# Patient Record
Sex: Male | Born: 1945 | Race: White | Hispanic: No | Marital: Married | Smoking: Never smoker
Health system: Southern US, Community
[De-identification: ages and names within clinical notes are randomized; demographics above are authoritative.]

## PROBLEM LIST (undated history)

## (undated) DIAGNOSIS — I428 Other cardiomyopathies: Secondary | ICD-10-CM

## (undated) DIAGNOSIS — I493 Ventricular premature depolarization: Secondary | ICD-10-CM

## (undated) DIAGNOSIS — E785 Hyperlipidemia, unspecified: Secondary | ICD-10-CM

## (undated) DIAGNOSIS — I712 Thoracic aortic aneurysm, without rupture: Secondary | ICD-10-CM

## (undated) DIAGNOSIS — N400 Enlarged prostate without lower urinary tract symptoms: Secondary | ICD-10-CM

## (undated) DIAGNOSIS — I5022 Chronic systolic (congestive) heart failure: Secondary | ICD-10-CM

## (undated) DIAGNOSIS — Z9289 Personal history of other medical treatment: Secondary | ICD-10-CM

## (undated) DIAGNOSIS — R001 Bradycardia, unspecified: Secondary | ICD-10-CM

## (undated) DIAGNOSIS — N210 Calculus in bladder: Secondary | ICD-10-CM

## (undated) DIAGNOSIS — I10 Essential (primary) hypertension: Secondary | ICD-10-CM

## (undated) DIAGNOSIS — G4730 Sleep apnea, unspecified: Secondary | ICD-10-CM

## (undated) HISTORY — DX: Thoracic aortic aneurysm, without rupture: I71.2

## (undated) HISTORY — DX: Chronic systolic (congestive) heart failure: I50.22

## (undated) HISTORY — DX: Sleep apnea, unspecified: G47.30

## (undated) HISTORY — DX: Personal history of other medical treatment: Z92.89

## (undated) HISTORY — DX: Ventricular premature depolarization: I49.3

## (undated) HISTORY — DX: Other cardiomyopathies: I42.8

## (undated) HISTORY — DX: Essential (primary) hypertension: I10

## (undated) HISTORY — DX: Bradycardia, unspecified: R00.1

## (undated) HISTORY — PX: TONSILLECTOMY: SUR1361

## (undated) HISTORY — DX: Hyperlipidemia, unspecified: E78.5

---

## 2002-08-25 ENCOUNTER — Ambulatory Visit (HOSPITAL_BASED_OUTPATIENT_CLINIC_OR_DEPARTMENT_OTHER): Admission: RE | Admit: 2002-08-25 | Discharge: 2002-08-25 | Payer: Self-pay | Admitting: Family Medicine

## 2003-01-11 ENCOUNTER — Inpatient Hospital Stay (HOSPITAL_COMMUNITY): Admission: EM | Admit: 2003-01-11 | Discharge: 2003-01-12 | Payer: Self-pay | Admitting: Emergency Medicine

## 2003-04-09 ENCOUNTER — Encounter: Admission: RE | Admit: 2003-04-09 | Discharge: 2003-04-09 | Payer: Self-pay | Admitting: Family Medicine

## 2004-09-07 ENCOUNTER — Ambulatory Visit: Payer: Self-pay | Admitting: Internal Medicine

## 2004-10-10 ENCOUNTER — Ambulatory Visit: Payer: Self-pay

## 2004-10-20 ENCOUNTER — Ambulatory Visit: Payer: Self-pay | Admitting: Internal Medicine

## 2004-11-21 ENCOUNTER — Ambulatory Visit: Payer: Self-pay | Admitting: Pulmonary Disease

## 2005-08-01 ENCOUNTER — Ambulatory Visit: Payer: Self-pay | Admitting: Pulmonary Disease

## 2006-01-21 ENCOUNTER — Ambulatory Visit (HOSPITAL_BASED_OUTPATIENT_CLINIC_OR_DEPARTMENT_OTHER): Admission: RE | Admit: 2006-01-21 | Discharge: 2006-01-21 | Payer: Self-pay | Admitting: Pulmonary Disease

## 2006-02-05 ENCOUNTER — Ambulatory Visit: Payer: Self-pay | Admitting: Pulmonary Disease

## 2006-04-03 ENCOUNTER — Ambulatory Visit: Payer: Self-pay | Admitting: Pulmonary Disease

## 2006-05-01 ENCOUNTER — Ambulatory Visit: Payer: Self-pay | Admitting: Internal Medicine

## 2006-05-30 ENCOUNTER — Ambulatory Visit: Payer: Self-pay | Admitting: Internal Medicine

## 2006-05-30 LAB — CONVERTED CEMR LAB
Direct LDL: 98.6 mg/dL
HDL: 24.8 mg/dL — ABNORMAL LOW (ref >39.0)
Triglycerides: 213 mg/dL (ref 0–149)
VLDL: 43 mg/dL — ABNORMAL HIGH (ref 0–40)

## 2006-12-13 ENCOUNTER — Encounter: Payer: Self-pay | Admitting: Internal Medicine

## 2007-01-02 HISTORY — PX: EP IMPLANTABLE DEVICE: SHX172B

## 2007-01-08 ENCOUNTER — Ambulatory Visit: Payer: Self-pay | Admitting: Cardiovascular Disease

## 2007-02-28 ENCOUNTER — Encounter: Payer: Self-pay | Admitting: Cardiology

## 2007-02-28 ENCOUNTER — Ambulatory Visit: Payer: Self-pay

## 2007-02-28 LAB — CONVERTED CEMR LAB
BUN: 11 mg/dL (ref 6–23)
Basophils Absolute: 0 10*3/uL (ref 0.0–0.1)
Calcium: 9.9 mg/dL (ref 8.4–10.5)
Eosinophils Absolute: 0.2 10*3/uL (ref 0.0–0.6)
GFR calc Af Amer: 88 mL/min
GFR calc non Af Amer: 73 mL/min
Lymphocytes Relative: 28 % (ref 12.0–46.0)
MCV: 91 fL (ref 78.0–100.0)
Monocytes Relative: 7.3 % (ref 3.0–11.0)
Neutro Abs: 3.1 10*3/uL (ref 1.4–7.7)
Platelets: 98 10*3/uL — ABNORMAL LOW (ref 150–400)
aPTT: 25 s (ref 21.7–29.8)

## 2007-03-04 ENCOUNTER — Ambulatory Visit: Payer: Self-pay | Admitting: Cardiology

## 2007-03-04 ENCOUNTER — Inpatient Hospital Stay (HOSPITAL_BASED_OUTPATIENT_CLINIC_OR_DEPARTMENT_OTHER): Admission: RE | Admit: 2007-03-04 | Discharge: 2007-03-04 | Payer: Self-pay | Admitting: Cardiology

## 2007-03-13 ENCOUNTER — Encounter: Payer: Self-pay | Admitting: Internal Medicine

## 2007-03-13 ENCOUNTER — Ambulatory Visit: Payer: Self-pay | Admitting: Cardiovascular Disease

## 2007-03-13 ENCOUNTER — Observation Stay (HOSPITAL_COMMUNITY): Admission: EM | Admit: 2007-03-13 | Discharge: 2007-03-14 | Payer: Self-pay | Admitting: Emergency Medicine

## 2007-03-13 ENCOUNTER — Ambulatory Visit: Payer: Self-pay

## 2007-03-17 ENCOUNTER — Ambulatory Visit: Payer: Self-pay | Admitting: Internal Medicine

## 2007-03-17 ENCOUNTER — Ambulatory Visit (HOSPITAL_COMMUNITY): Admission: RE | Admit: 2007-03-17 | Discharge: 2007-03-18 | Payer: Self-pay | Admitting: Internal Medicine

## 2007-03-24 DIAGNOSIS — G4730 Sleep apnea, unspecified: Secondary | ICD-10-CM | POA: Insufficient documentation

## 2007-03-25 ENCOUNTER — Ambulatory Visit: Payer: Self-pay | Admitting: Pulmonary Disease

## 2007-03-25 DIAGNOSIS — J984 Other disorders of lung: Secondary | ICD-10-CM | POA: Insufficient documentation

## 2007-03-31 ENCOUNTER — Ambulatory Visit: Payer: Self-pay

## 2007-04-08 ENCOUNTER — Telehealth: Payer: Self-pay | Admitting: Pulmonary Disease

## 2007-04-22 ENCOUNTER — Ambulatory Visit: Payer: Self-pay | Admitting: Pulmonary Disease

## 2007-04-25 ENCOUNTER — Ambulatory Visit: Payer: Self-pay

## 2007-04-25 ENCOUNTER — Ambulatory Visit: Payer: Self-pay | Admitting: Internal Medicine

## 2007-04-30 ENCOUNTER — Ambulatory Visit: Payer: Self-pay | Admitting: Gastroenterology

## 2007-04-30 ENCOUNTER — Encounter: Payer: Self-pay | Admitting: Gastroenterology

## 2007-05-27 ENCOUNTER — Telehealth: Payer: Self-pay | Admitting: Gastroenterology

## 2007-05-28 ENCOUNTER — Ambulatory Visit: Payer: Self-pay | Admitting: Gastroenterology

## 2007-05-28 ENCOUNTER — Encounter: Payer: Self-pay | Admitting: Gastroenterology

## 2007-05-29 ENCOUNTER — Encounter: Payer: Self-pay | Admitting: Gastroenterology

## 2007-07-03 ENCOUNTER — Ambulatory Visit: Payer: Self-pay | Admitting: Internal Medicine

## 2007-07-03 LAB — CONVERTED CEMR LAB
BUN: 11 mg/dL (ref 6–23)
GFR calc Af Amer: 88 mL/min
GFR calc non Af Amer: 73 mL/min
Potassium: 3.9 meq/L (ref 3.5–5.1)
Sodium: 141 meq/L (ref 135–145)

## 2007-08-04 ENCOUNTER — Ambulatory Visit: Payer: Self-pay | Admitting: Internal Medicine

## 2007-09-18 ENCOUNTER — Ambulatory Visit: Payer: Self-pay | Admitting: Internal Medicine

## 2007-10-22 ENCOUNTER — Ambulatory Visit: Payer: Self-pay | Admitting: Internal Medicine

## 2008-01-21 ENCOUNTER — Ambulatory Visit: Payer: Self-pay | Admitting: Internal Medicine

## 2008-03-02 ENCOUNTER — Encounter: Payer: Self-pay | Admitting: Pulmonary Disease

## 2008-03-05 ENCOUNTER — Ambulatory Visit (HOSPITAL_COMMUNITY): Admission: RE | Admit: 2008-03-05 | Discharge: 2008-03-05 | Payer: Self-pay | Admitting: Pulmonary Disease

## 2008-03-08 ENCOUNTER — Telehealth (INDEPENDENT_AMBULATORY_CARE_PROVIDER_SITE_OTHER): Payer: Self-pay | Admitting: *Deleted

## 2008-03-24 DIAGNOSIS — I495 Sick sinus syndrome: Secondary | ICD-10-CM | POA: Insufficient documentation

## 2008-03-24 DIAGNOSIS — I493 Ventricular premature depolarization: Secondary | ICD-10-CM | POA: Insufficient documentation

## 2008-03-24 DIAGNOSIS — I359 Nonrheumatic aortic valve disorder, unspecified: Secondary | ICD-10-CM | POA: Insufficient documentation

## 2008-03-25 ENCOUNTER — Ambulatory Visit: Payer: Self-pay | Admitting: Internal Medicine

## 2008-03-31 ENCOUNTER — Telehealth (INDEPENDENT_AMBULATORY_CARE_PROVIDER_SITE_OTHER): Payer: Self-pay

## 2008-04-01 ENCOUNTER — Ambulatory Visit: Payer: Self-pay

## 2008-04-01 ENCOUNTER — Encounter: Payer: Self-pay | Admitting: Internal Medicine

## 2008-04-09 ENCOUNTER — Encounter: Payer: Self-pay | Admitting: Internal Medicine

## 2008-04-22 ENCOUNTER — Ambulatory Visit: Payer: Self-pay | Admitting: Internal Medicine

## 2008-04-22 ENCOUNTER — Encounter: Payer: Self-pay | Admitting: Internal Medicine

## 2008-04-22 DIAGNOSIS — E785 Hyperlipidemia, unspecified: Secondary | ICD-10-CM | POA: Insufficient documentation

## 2008-04-26 ENCOUNTER — Ambulatory Visit: Payer: Self-pay | Admitting: Internal Medicine

## 2008-04-29 ENCOUNTER — Encounter (INDEPENDENT_AMBULATORY_CARE_PROVIDER_SITE_OTHER): Payer: Self-pay | Admitting: *Deleted

## 2008-05-26 ENCOUNTER — Telehealth: Payer: Self-pay | Admitting: Internal Medicine

## 2008-06-30 ENCOUNTER — Telehealth: Payer: Self-pay | Admitting: Internal Medicine

## 2008-07-02 ENCOUNTER — Encounter: Payer: Self-pay | Admitting: Internal Medicine

## 2008-07-02 ENCOUNTER — Ambulatory Visit: Payer: Self-pay

## 2008-07-06 DIAGNOSIS — I712 Thoracic aortic aneurysm, without rupture, unspecified: Secondary | ICD-10-CM | POA: Insufficient documentation

## 2008-07-06 HISTORY — DX: Thoracic aortic aneurysm, without rupture: I71.2

## 2008-07-06 HISTORY — DX: Thoracic aortic aneurysm, without rupture, unspecified: I71.20

## 2008-08-31 ENCOUNTER — Ambulatory Visit: Payer: Self-pay | Admitting: Internal Medicine

## 2008-08-31 DIAGNOSIS — R072 Precordial pain: Secondary | ICD-10-CM | POA: Insufficient documentation

## 2008-08-31 DIAGNOSIS — Z950 Presence of cardiac pacemaker: Secondary | ICD-10-CM | POA: Insufficient documentation

## 2008-09-09 ENCOUNTER — Ambulatory Visit: Payer: Self-pay | Admitting: Internal Medicine

## 2008-09-13 ENCOUNTER — Ambulatory Visit: Payer: Self-pay | Admitting: Internal Medicine

## 2008-09-23 LAB — CONVERTED CEMR LAB
Cholesterol: 146 mg/dL (ref 0–200)
Total CHOL/HDL Ratio: 6
Triglycerides: 116 mg/dL (ref 0.0–149.0)

## 2008-11-15 ENCOUNTER — Encounter: Payer: Self-pay | Admitting: Internal Medicine

## 2008-11-15 DIAGNOSIS — R079 Chest pain, unspecified: Secondary | ICD-10-CM | POA: Insufficient documentation

## 2008-12-01 ENCOUNTER — Telehealth (INDEPENDENT_AMBULATORY_CARE_PROVIDER_SITE_OTHER): Payer: Self-pay

## 2008-12-02 ENCOUNTER — Encounter: Payer: Self-pay | Admitting: Internal Medicine

## 2008-12-02 ENCOUNTER — Encounter: Payer: Self-pay | Admitting: Cardiology

## 2008-12-02 ENCOUNTER — Ambulatory Visit: Payer: Self-pay | Admitting: Cardiology

## 2008-12-02 ENCOUNTER — Encounter (HOSPITAL_COMMUNITY): Admission: RE | Admit: 2008-12-02 | Discharge: 2008-12-29 | Payer: Self-pay | Admitting: Internal Medicine

## 2008-12-02 ENCOUNTER — Ambulatory Visit: Payer: Self-pay

## 2008-12-09 ENCOUNTER — Encounter: Payer: Self-pay | Admitting: Internal Medicine

## 2008-12-27 ENCOUNTER — Ambulatory Visit: Payer: Self-pay | Admitting: Internal Medicine

## 2009-01-06 ENCOUNTER — Encounter: Payer: Self-pay | Admitting: Internal Medicine

## 2009-02-28 ENCOUNTER — Ambulatory Visit: Payer: Self-pay | Admitting: Internal Medicine

## 2009-03-01 LAB — CONVERTED CEMR LAB
CO2: 30 meq/L (ref 19–32)
Chloride: 109 meq/L (ref 96–112)
Creatinine, Ser: 1.1 mg/dL (ref 0.4–1.5)

## 2009-03-02 ENCOUNTER — Ambulatory Visit: Payer: Self-pay | Admitting: Cardiovascular Disease

## 2009-03-02 ENCOUNTER — Ambulatory Visit: Payer: Self-pay | Admitting: Internal Medicine

## 2009-03-03 ENCOUNTER — Ambulatory Visit: Payer: Self-pay | Admitting: Internal Medicine

## 2009-03-03 DIAGNOSIS — I2510 Atherosclerotic heart disease of native coronary artery without angina pectoris: Secondary | ICD-10-CM | POA: Insufficient documentation

## 2009-03-15 ENCOUNTER — Encounter: Payer: Self-pay | Admitting: Internal Medicine

## 2009-04-21 ENCOUNTER — Telehealth: Payer: Self-pay | Admitting: Internal Medicine

## 2009-05-24 ENCOUNTER — Encounter: Admission: RE | Admit: 2009-05-24 | Discharge: 2009-06-08 | Payer: Self-pay | Admitting: Orthopedic Surgery

## 2009-06-28 ENCOUNTER — Ambulatory Visit: Payer: Self-pay | Admitting: Internal Medicine

## 2009-09-29 ENCOUNTER — Ambulatory Visit: Payer: Self-pay | Admitting: Internal Medicine

## 2009-09-30 ENCOUNTER — Encounter: Payer: Self-pay | Admitting: Internal Medicine

## 2009-11-16 ENCOUNTER — Encounter: Payer: Self-pay | Admitting: Internal Medicine

## 2010-01-05 ENCOUNTER — Ambulatory Visit
Admission: RE | Admit: 2010-01-05 | Discharge: 2010-01-05 | Payer: Self-pay | Source: Home / Self Care | Attending: Internal Medicine | Admitting: Internal Medicine

## 2010-01-05 ENCOUNTER — Encounter: Payer: Self-pay | Admitting: Internal Medicine

## 2010-01-22 ENCOUNTER — Encounter: Payer: Self-pay | Admitting: Pulmonary Disease

## 2010-01-31 NOTE — Letter (Signed)
Summary: Remote Device Check  [facility], Main Office  [address]   [place], [place] [address]   Phone: [phone]  Fax: [phone]     January 06, 2009 MRN: [id number]   Patrick Weaver [address] [place], [place]  [address]   Dear Mr. FORGET,   Your remote transmission was recieved and reviewed by your physician.  All diagnostics were within normal limits for you.  __X___Your next transmission is scheduled for:   March 30, 2009.  Please transmit at any time this day.  If you have a wireless device your transmission will be sent automatically.      Sincerely,  Proofreader

## 2010-01-31 NOTE — Cardiovascular Report (Signed)
Summary: Office Visit Remote   Office Visit Remote   Imported By: Roderic Ovens 01/12/2009 14:59:42  _____________________________________________________________________  External Attachment:    Type:   Image     Comment:   External Document

## 2010-01-31 NOTE — Cardiovascular Report (Signed)
Summary: Office Visit   Office Visit   Imported By: Roderic Ovens 06/29/2009 12:29:17  _____________________________________________________________________  External Attachment:    Type:   Image     Comment:   External Document

## 2010-01-31 NOTE — Assessment & Plan Note (Signed)
Summary: PER CHECK OUT/SF ct of chest @ 8:30   Visit Type:  Follow-up Primary Provider:  Lanell Persons   History of Present Illness: Patient is a 65 year old with a history of mildly depressed LVEF , PVCs, bradycardia.  I last saw him in the fall.   When I saw him last, he had complained of some chest pressure.  Myoview was not completely normal, but unchanged from previous (he has very mild CAD).  His pressure had resolved. SInce then he denies significant pressure.  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  He denies dizziness.  Notes some palpitations recently.  Current Medications (verified): 1)  Carvedilol 6.25 Mg Tabs (Carvedilol) .... Take 1 Tablet By Mouth Two Times A Day 2)  Avapro 150 Mg Tabs (Irbesartan) .... Take 1 Tablet By Mouth Once A Day 3)  Flecainide Acetate 150 Mg Tabs (Flecainide Acetate) .... 1/2 Tablet By Mouth Two Times A Day  Allergies (verified): 1)  ! Codeine  Past History:  Past Medical History: Last updated: 03/24/2008 Sleep Apnea palpitations- dyslipidemia-failed niaspan SLEEP APNEA (ICD-780.57)  bradycardia, treated with pacemaker placement Dr. Berton Mount 2009. Hypertension  Social History: Last updated: 03/03/2009 married and lives in [place] Has  his own Teaching laboratory technician shop. Drinks 3 caffeinated beverages a day; denies alcohol nonsmoker. Has 3 daughters.  Social History: married and lives in [place] Has  his own Teaching laboratory technician shop. Drinks 3 caffeinated beverages a day; denies alcohol nonsmoker. Has 3 daughters.  Vital Signs:  Patient profile:   65 year old male Height:      72 inches Weight:      208 pounds BMI:     28.31 Pulse rate:   60 / minute BP sitting:   120 / 75  (left arm) Cuff size:   large  Vitals Entered By: Burnett Kanaris, CNA (March 03, 2009 9:12 AM)  Physical Exam  Additional Exam:  HEENT:  Normocephalic, atraumatic. EOMI, PERRLA.  Neck: JVP is normal. No thyromegaly. No bruits.    Lungs: clear to auscultation. No rales no wheezes.  Heart: Regular rate and rhythm. Normal S1, S2. No S3.  Gr 1/VI diastolic murmur at L sternal border.  PMI not displaced.  Abdomen:  Supple, nontender. Normal bowel sounds. No masses. No hepatomegaly.  Extremities:   Good distal pulses throughout. No lower extremity edema.  Musculoskeletal :moving all extremities.  Neuro:   alert and oriented x3.    PPM Specifications Following MD:  Sherryl Manges, MD     Referring MD:  Laasia Arcos PPM Vendor:  Medtronic     PPM Model Number:  [id number]     PPM Serial Number:  [id number] PPM DOI:  03/17/2007     PPM Implanting MD:  Sherryl Manges, MD  Lead 1    Location: RA     DOI: 03/17/2007     Model #: [id number]     Serial #: [id number]     Status: active Lead 2    Location: RV     DOI: 03/17/2007     Model #: [id number]     Serial #: [id number]     Status: active   Indications:  SSS, BRADYCARDIA   PPM Follow Up Pacer Dependent:  No      Episodes Coumadin:  No  Parameters Mode:  DDDR+     Lower Rate Limit:  60     Upper Rate Limit:  130 Paced AV Delay:  150     Sensed AV Delay:  120  Impression & Recommendations:  Problem # 1:  PALPITATIONS (ICD-785.1) Phone check of his pacer shows that he had a total of 403,000 PVCs during 5 mon, not alot.  He had 4 episodes of mode switching not long ago that were less than 30 sec. I would keep him on his same medicines.  I would try tapering his Coreg to see if, by allowing his heart rate toincrase he will have some more energy.  He needs to be on a b blocker since he is on flecanide.  He will calll in 2 to 3 wks with response.  Problem # 2:  CHEST PAIN UNSPECIFIED (ICD-786.50) Patient denies. He does have mild CAD.  Myoview had not changed from past.  I am not convinced that tiring represents an angina equivalent.  Problem # 3:  CONGESTIVE HEART FAILURE, UNSPECIFIED (ICD-428.0) Patient has very mild LV dysfunction by echo.  I am not convinced this explains his symtoms.  I  would keep him on the same meds. Note his father has a history of a cardiomyopathy.  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  Willl need to determine f/u.  Problem # 5:  AORTIC INSUFFICIENCY (ICD-424.1) BY exam mild.  WIll need f/u .

## 2010-01-31 NOTE — Letter (Signed)
Summary: Remote Device Check  [facility], Main Office  [address]   [place], [place] [address]   Phone: [phone]  Fax: [phone]     March 15, 2009 MRN: [id number]   Patrick Weaver [address] [place], [place]  [address]   Dear Patrick Weaver,   Your remote transmission was recieved and reviewed by your physician.  All diagnostics were within normal limits for you.    __X____Your next office visit is scheduled for:   JUNE 2011 WITH DR Graciela Husbands. Please call our office to schedule an appointment.    Sincerely,  Proofreader

## 2010-01-31 NOTE — Letter (Signed)
Summary: Remote Device Check  [facility], Main Office  [address]   [place], [place] [address]   Phone: [phone]  Fax: [phone]     November 16, 2009 MRN: [id number]   Patrick Weaver [address] [place], [place]  [address]   Dear [name],   Your remote transmission was recieved and reviewed by your physician.  All diagnostics were within normal limits for you.  __X___Your next transmission is scheduled for:  01-05-2010.  Please transmit at any time this day.  If you have a wireless device your transmission will be sent automatically.   Sincerely,  Vella Kohler

## 2010-01-31 NOTE — Assessment & Plan Note (Signed)
Summary: pc2   Primary Provider:  Lanell Persons   History of Present Illness:  Patrick Weaver is seen in followup for pacer implantation for bradycardia.  He has also had significant problems with pvcs. This has been treated with flecanide with much improvement.  The patient denies SOB, chest pain, edema or palpitations   Myoview scan last December demonstrated ejection fraction of 44% with global hypokinesis.  Current Medications (verified): 1)  Carvedilol 6.25 Mg Tabs (Carvedilol) .... Take 1 Tablet By Mouth Two Times A Day 2)  Flecainide Acetate 150 Mg Tabs (Flecainide Acetate) .... 1/2 Tablet By Mouth Two Times A Day 3)  Cozaar 50 Mg Tabs (Losartan Potassium) .Marland Kitchen.. 1 Tab Every Day  ( Generic Is Ok)  Allergies (verified): 1)  ! Codeine  Past History:  Past Medical History: Last updated: 03/24/2008 Sleep Apnea palpitations- dyslipidemia-failed niaspan SLEEP APNEA (ICD-780.57)  bradycardia, treated with pacemaker placement Dr. Berton Mount 2009. Hypertension  Past Surgical History: Last updated: 09/09/2008 Permanant pacemaker  Family History: Last updated: 03/24/2008 hypertension-mother..brother Dad:  Hx CHF; died of MI No FH of Colon Cancer:  Social History: Last updated: 03/03/2009 married and lives in [place] Has  his own Teaching laboratory technician shop. Drinks 3 caffeinated beverages a day; denies alcohol nonsmoker. Has 3 daughters.  Vital Signs:  Patient profile:   65 year old male Height:      72 inches Weight:      204 pounds BMI:     27.77 Pulse rate:   71 / minute Pulse rhythm:   regular BP sitting:   126 / 80  (left arm) Cuff size:   regular  Vitals Entered By: Judithe Modest CMA (June 28, 2009 10:15 AM)  Physical Exam  General:  The patient was alert and oriented in no acute distress. HEENT Normal.  Neck veins were flat, carotids were brisk.  Lungs were clear.  Heart sounds were regular without murmurs or gallops.  Abdomen was soft with active bowel  sounds. There is no clubbing cyanosis or edema. Skin Warm and dry    PPM Specifications Following MD:  Sherryl Manges, MD     Referring MD:  ROSS PPM Vendor:  Medtronic     PPM Model Number:  ADDRL1     PPM Serial Number:  [id number] PPM DOI:  03/17/2007     PPM Implanting MD:  Sherryl Manges, MD  Lead 1    Location: RA     DOI: 03/17/2007     Model #: [id number]     Serial #: [id number]     Status: active Lead 2    Location: RV     DOI: 03/17/2007     Model #: [id number]     Serial #: [id number]     Status: active   Indications:  SSS, BRADYCARDIA   PPM Follow Up Battery Voltage:  2.79 V     Battery Est. Longevity:  12.42yrs     Pacer Dependent:  No       PPM Device Measurements Atrium  Amplitude: 2.80 mV, Impedance: 397 ohms, Threshold: 0.50 V at 0.40 msec Right Ventricle  Amplitude: 22.40 mV, Impedance: 514 ohms, Threshold: 0.50 V at 0.40 msec  Episodes MS Episodes:  6     Percent Mode Switch:  <0.1%     Coumadin:  No Ventricular High Rate:  1     Atrial Pacing:  80.4%     Ventricular Pacing:  0.9%  Parameters Mode:  MVP     Lower Rate Limit:  60     Upper Rate Limit:  160 Paced AV Delay:  150     Sensed AV Delay:  120 Next Remote Date:  09/29/2009     Tech Comments:  1 VHR EPISODE LASTING 6 SECONDS.  6 MODE SWITCHES-ALL LESS THAN 1 MINUTE.  NORMAL DEVICE FUNCTION.  CHANGED MSR AND MTR TO 160bpm DUE TO AGE AND HISTOGRAM.  CARELINK CHECK 09-29-09.  Vella Kohler  June 28, 2009 10:31 AM  Impression & Recommendations:  Problem # 1:  LEFT VENTRICULAR FUNCTION, DECREASED (ICD-429.2) the patient has a mild cardiopath; yongoing therapy with beta blockers and ACE inhibitors is appropriate.  up titration may be necessary overtime. I think the ejection fraction is probably adequate to allow continued use of flecainide. In the event that it falls below 40% however, I think would be compelled to stop  Problem # 2:  PACEMAKER, PERMANENT (ICD-V45.01) Device parameters and data were reviewed; heart rate  histograms were evaluated were found to be limited. Less 1% of his beats exceed 65% of his predicted maximal heart rate  Problem # 3:  BRADYCARDIA (ICD-427.89) Chromotropically incompetent as noted above; device adjustments made His updated medication list for this problem includes:    Carvedilol 6.25 Mg Tabs (Carvedilol) .Marland Kitchen... Take 1 tablet by mouth two times a day    Flecainide Acetate 150 Mg Tabs (Flecainide acetate) .Marland Kitchen... 1/2 tablet by mouth two times a day  Problem # 4:  PREMATURE VENTRICULAR CONTRACTIONS (ICD-427.69)  His updated medication list for this problem includes:    Carvedilol 6.25 Mg Tabs (Carvedilol) .Marland Kitchen... Take 1 tablet by mouth two times a day    Flecainide Acetate 150 Mg Tabs (Flecainide acetate) .Marland Kitchen... 1/2 tablet by mouth two times a day  His updated medication list for this problem includes:    Carvedilol 6.25 Mg Tabs (Carvedilol) .Marland Kitchen... Take 1 tablet by mouth two times a day    Flecainide Acetate 150 Mg Tabs (Flecainide acetate) .Marland Kitchen... 1/2 tablet by mouth two times a day

## 2010-01-31 NOTE — Cardiovascular Report (Signed)
Summary: Office Visit Remote   Office Visit Remote   Imported By: Roderic Ovens 11/18/2009 14:11:04  _____________________________________________________________________  External Attachment:    Type:   Image     Comment:   External Document

## 2010-01-31 NOTE — Progress Notes (Signed)
Summary: cost of avapro double in pay.   Phone Note Call from Patient Call back at Home Phone [phone]   Refills Requested: Medication #1:  G TABS Take 1 tablet by mouth once a day Caller: Spouse -donna  Reason for Call: Talk to Nurse Summary of Call: Patrick Weaver on AVAPRO 150 M. the co-pay have double. wife call insurance company - they told her dr. Tenny Craw can write a letter of necssity, fax # [phone]. or chose preferred list. 1. benicar 2. mardicas 3. losartan. Patrick Weaver has only one tab left for today. [phone] . Initial call taken by: Lorne Skeens,  April 21, 2009 8:34 AM  Follow-up for Phone Call        Called patient's wife back...advised we can change medication to Cozaar 50mg  every day per Dr.Katz (DOD) and Weston Brass Pharm D. Follow-up by: J REISS RN    New/Updated Medications: COZAAR 50 MG TABS (LOSARTAN POTASSIUM) 1 Tab every day  ( generic is OK) Prescriptions: COZAAR 50 MG TABS (LOSARTAN POTASSIUM) 1 Tab every day  ( generic is OK)  #90 x 3   Entered by:   Layne Benton, RN, BSN   Authorized by:   Sherrill Raring, MD, [facility]   Signed by:   Layne Benton, RN, BSN on 04/21/2009   Method used:   [facility]ronically to        [facility] [phone][facility] (retail)       [address]       [place], [place]  [address]       Ph: [phone]       Fax: [phone]   RxID:   [id number]

## 2010-01-31 NOTE — Cardiovascular Report (Signed)
Summary: Office Visit Remote   Office Visit Remote   Imported By: Roderic Ovens 03/16/2009 11:21:34  _____________________________________________________________________  External Attachment:    Type:   Image     Comment:   External Document

## 2010-02-12 ENCOUNTER — Encounter (INDEPENDENT_AMBULATORY_CARE_PROVIDER_SITE_OTHER): Payer: Self-pay | Admitting: *Deleted

## 2010-02-22 NOTE — Letter (Signed)
Summary: Remote Device Check  [facility], Main Office  [address]   [place], [place] [address]   Phone: [phone]  Fax: [phone]     February 12, 2010 MRN: [id number]   Patrick Weaver [address] [place], [place]  [address]   Dear [name],   Your remote transmission was recieved and reviewed by your physician.  All diagnostics were within normal limits for you.  __X___Your next transmission is scheduled for:  04-06-2010.  Please transmit at any time this day.  If you have a wireless device your transmission will be sent automatically.   Sincerely,  Vella Kohler

## 2010-02-22 NOTE — Cardiovascular Report (Signed)
Summary: Office Visit Remote   Office Visit Remote   Imported By: Roderic Ovens 02/14/2010 15:15:20  _____________________________________________________________________  External Attachment:    Type:   Image     Comment:   External Document

## 2010-04-06 ENCOUNTER — Ambulatory Visit (INDEPENDENT_AMBULATORY_CARE_PROVIDER_SITE_OTHER): Payer: BC Managed Care – PPO | Admitting: *Deleted

## 2010-04-06 DIAGNOSIS — R0989 Other specified symptoms and signs involving the circulatory and respiratory systems: Secondary | ICD-10-CM

## 2010-04-06 DIAGNOSIS — I495 Sick sinus syndrome: Secondary | ICD-10-CM

## 2010-04-06 DIAGNOSIS — Z950 Presence of cardiac pacemaker: Secondary | ICD-10-CM

## 2010-04-07 ENCOUNTER — Other Ambulatory Visit: Payer: Self-pay

## 2010-04-10 NOTE — Progress Notes (Signed)
Pacer remote check  

## 2010-04-19 ENCOUNTER — Other Ambulatory Visit: Payer: Self-pay | Admitting: Internal Medicine

## 2010-04-23 ENCOUNTER — Encounter: Payer: Self-pay | Admitting: *Deleted

## 2010-05-16 NOTE — Letter (Signed)
October 22, 2007    Pricilla Riffle, MD, [facility]  [address],  [address]  [place], [place] [address]   RE:  Patrick Weaver, Patrick Weaver  MRN:  [id number]  /  DOB:  11/19/1945   Dear Gunnar Fusi:   Thanks very much for asking [place] to see Patrick Weaver about his PVC burden.   As you know, he underwent pacemaker implantation in the Spring of this  year for sinus node dysfunction that was quite limiting.  His exercise  capacity is markedly improved since then.  The other issue is that he  continues to have palpitations.  Interrogation of device since you saw  him last, demonstrated 2300 PVCs in the last month.  These are  unassociated with exercise intolerance, lightheadedness, or discomfort.   His exercise capacity is much improved as noted.   Part of the other issue as to do with his intervening changes LV  function.  Catheterization a few years ago demonstrated nonobstructive  disease and ejection fraction of 55%, catheterization by Dr. Riley Kill in  March of this year demonstrated ejection fraction was mildly globally  hypokinetic.  The nuclear medicine stress test had demonstrated  calculated ejection fraction.  The nuclear medicine stress test at  calculating EF of 40% and the echo that was done following had an  ejection fraction of 55%.  The LV end-diastolic dimension were described  as 58 mm and then crossed out by you, I think you described them as  being normal, although the comment is mildly dilated by Brian's  review.   His medications are notable for taking nadolol and Avapro.  His cough  were improved with the discontinuation of lisinopril, and his headache  stopped with the discontinuation of atenolol.  His doses were 20 or 10  and 75 respectively.   On examination, his blood pressure was 119/72.  His pulse was 67.  His  weight was 201.  His neck veins were flat.  His lungs were clear.  His heart sounds were regular.  The abdomen was soft and the extremities  were without edema.   Review  of his Holter monitor from 2008 demonstrated 1500 PVCs a day,  this compares to 8000 on average since his last visit.  Morphologically  there are five different types with one predominant.   IMPRESSION:  1. Sinus node dysfunction, status post pacemaker implantation with      marked improvement in exercise capacity.  2. Premature ventricular contraction frequency - increasing over time,      now comprising about 8-10% of his beats.  3. Mild cardiomyopathy - global question related to number 2.  4. Aortic insufficiency - mild to moderate with mild dilatation of the      left ventricle.   DISCUSSION:  Gunnar Fusi, Patrick Weaver's PVCs are largely asymptomatic to him.  The question as to whether they are contributing to his cardiomyopathy  is valid and we will need to keep a close eye on the burden.  As little  as 5000 PVCs per day have been associated with cardiomyopathy that is  improved with elimination of the PVCs.  In his case, given the multiple  morphology this would need to be undertaken probably with drug therapy.   He is scheduled to have his device checked again in 3 months.  I will  plan to look at his PVC burden at that time.  He is to see you a month  after that and we can decide if any other  thing is to be done.   Given the intercurrent cardiomyopathy, I have changes his nadolol to  carvedilol at 6.25 b.i.d. and I have refilled the prescription for his  Avapro.   We will see his device interrogation in 3 months.   Thanks very much for asked to me to see Patrick Weaver.    Sincerely,      Duke Salvia, MD, [facility]  Electronically Signed    SCK/MedQ  DD: 10/22/2007  DT: 10/22/2007  Job #: [id number]

## 2010-05-16 NOTE — Procedures (Signed)
[facility]                              EXERCISE TREADMILL   NAME:DILLONFamous, Eisenhardt                         MRN:          [id number]  DATE:04/25/2007                            DOB:          1945-08-06    HISTORY:  Mr. Demirjian is admitted for treadmill testing for assessment of  chronotropic competence.  His device was reprogrammed just prior to this  with a down-programming of his activities of daily living.  He then went  through a modification of a standard Bruce protocol achieving a heart  rate of 120 beats per minute or so in stage III without significant  symptoms apart from the coughing that he has had from his recent  bronchitis.   I felt this was a very adequate response.     Duke Salvia, MD, [facility]  Electronically Signed    SCK/MedQ  DD: 04/25/2007  DT: 04/25/2007  Job #: [id number]

## 2010-05-16 NOTE — Assessment & Plan Note (Signed)
[facility]                         ELECTROPHYSIOLOGY OFFICE NOTE   NAME:Patrick Weaver, Patrick Weaver                         MRN:          [id number]  DATE:07/03/2007                            DOB:          [birth year]    [name] is a 65 year old gentleman who is status post pacemaker  implantation recently for chronotropic incompetence.  His energy level  is much improved.  Unfortunately, he is also still having palpitations  and some fatigue.  His blood pressure was elevated yesterday, he was red  in the face, and when he finally settled down after an hour his blood  pressure was still quite elevated.  He takes lisinopril 20 a day for  this.  He has also been taking verapamil p.r.n. but does not use much of  it for his ventricular ectopy.   On examination today, his blood pressure was 120/70 with a pulse of 78.  The lungs were clear.  His heart sounds were regular.  The extremities  were without edema.  Skin was warm and dry.   Interrogation of his Medtronic pulse generator demonstrates P-wave of  2.8 with impedance of 43, threshold 0.5 to 0.4, the R-waves 11 with  impedance of 657, threshold 0.375.  The device was reprogrammed.  His  heart rate histograms were interesting in that in the ventricular there  is a significant burden of PVCs; out of a total of 7 million beats he  has a million PVCs, or about 14%.   His heart rate excursion in his atrial channel is much more normal.  There are 15% of his beats though below lower rate of 60.  His heart  rate excursion at the upper end is not too bad and, actually, he paces  only a small percentage of those, making me wonder whether some of those  are PACs given what we know about chronotropic incompetence.   IMPRESSION:  1. Sinus node dysfunction with chronotropic incompetence.  2. Status post pacer for the sinus node dysfunction with chronotropic      incompetence.  3. Premature ventricular contractions,  likely symptomatic.  4. Hypertension, on lisinopril, with concerns that it is inadequately      controlled.   We will plan today to check potassium and magnesium.  I have given him  prescriptions for 3 different beta blockers, Inderal LA 60, nadolol 20,  and atenolol 50 to see if any of these are a) Tolerated and b) Can be  effective in reducing his PVC burden.   We will have him follow up with Dr. Tenny Craw in 4 weeks' time, and I will  see him again in 9 months' time in Device Clinic.     Duke Salvia, MD, [facility]  Electronically Signed   SCK/MedQ  DD: 07/03/2007  DT: 07/04/2007  Job #: [id number]   cc:   [facility]

## 2010-05-16 NOTE — Discharge Summary (Signed)
NAME:  Patrick Weaver, Patrick Weaver NO.:  [id number]   MEDICAL RECORD NO.:  [id number]          PATIENT TYPE:  OBV   LOCATION:  3743                         FACILITY:  [facility]   PHYSICIAN:  Rollene Rotunda, MD, FACCDATE OF BIRTH:  09-27-1945   DATE OF ADMISSION:  03/13/2007  DATE OF DISCHARGE:  03/14/2007                               DISCHARGE SUMMARY   DATE OF ADMISSION:  March 13, 2007   DATE OF DISCHARGE:  March 14, 2007   PRIMARY CARDIOLOGIST:  Pricilla Riffle, MD, [facility]   PRIMARY CARE PHYSICIAN:  [facility]   DISCHARGE DIAGNOSIS:  Chronotropic incompetence.   SECONDARY DIAGNOSES:  1. Chest pain.  2. Nonobstructive coronary disease.  3. Ongoing palpitations.  4. Hyperlipidemia with low HDL.  5. Obstructive sleep apnea intolerant to CPAP.  6. Grade 1 diastolic dysfunction.   ALLERGIES:  NO KNOWN DRUG ALLERGIES.   PROCEDURES:  None.   HISTORY OF PRESENT ILLNESS:  A 65 year old Caucasian male with a prior  history of chest pain status post cardiac catheterization March 04, 2007  revealing nonobstructive disease.  Since his catheterization has  continued to have intermittent chest discomfort and palpitations as well  as dyspnea on exertion after walking less than 20 feet.  Symptoms  resolve with rest.  For these reasons, he presented back to the ED March 13, 2007, and was noted to be bradycardic with rates in the 40s to 50s.  The patient was admitted and ruled out for MI.  He has been evaluated  for pacemaker placement and it was felt that the patient suffers from  chronotropic incompetence and would benefit from pacer placement.  We  have arranged for him to come back to [facility] for outpatient pacer  placement with Dr. Graciela Husbands on March 16 at 7:00 a.m.  He will be discharged  home today in good condition.   DISCHARGE LABS:  Hemoglobin 14.8, hematocrit 42.2, WBC 5.6, platelets  144.  Sodium 137, potassium 3.6, chloride 106, CO2 25, BUN 13,  creatinine 0.14,  glucose 145.  TSH 2.053.   DISPOSITION:  The patient is being discharged home today in good  condition.   FOLLOW-UP PLANS AND APPOINTMENTS:  As noted above, the patient will  present to [facility] short stay on March 17, 2007 at 7:00 a.m. for a  permanent pacemaker placement with Dr. Graciela Husbands.   DISCHARGE MEDICATIONS:  Lisinopril 20 mg daily.   OUTSTANDING LAB STUDIES:  None.   DURATION DISCHARGE ENCOUNTER:  Forty minutes including physician time.      Nicolasa Ducking, ANP      Rollene Rotunda, MD, [facility]  Electronically Signed    CB/MEDQ  D:  03/14/2007  T:  03/15/2007  Job:  [id number]   cc:   Wendall Mola

## 2010-05-16 NOTE — H&P (Signed)
NAME:  Patrick Weaver, Patrick Weaver NO.:  [id number]   MEDICAL RECORD NO.:  [id number]          PATIENT TYPE:  OBV   LOCATION:  3743                         FACILITY:  [facility]   PHYSICIAN:  Noralyn Pick. Eden Emms, MD, FACCDATE OF BIRTH:  1945-02-23   DATE OF ADMISSION:  03/13/2007  DATE OF DISCHARGE:                              HISTORY & PHYSICAL   PRIMARY CARDIOLOGIST:  Pricilla Riffle, M.D.   PRIMARY CARE PHYSICIAN:  [facility] [place].   HISTORY OF PRESENT ILLNESS:  This is a 65 year old Caucasian male with  continued chest pain and palpitations since December of 2008.  The  patient had a recent cardiac catheterization March 04, 2007 showing no  critical disease.  He has chest pain any time he walks greater than 20  feet with associated skipping.  The patient has been having pain all  week with minimal exertion.  The further he walks the more it hurts.  He  has associated shortness of breath and light headedness and  palpitations.  Today, he was scheduled for echocardiogram at [facility] office.  While walking into the office he began to feel what  he describes as something sitting on his chest with left arm pain to  fingers and palpitations.  He states sometimes the palpitations start  and then the chest pain begins.  It is relieved with rest.  He did  undergo echocardiogram in the office but then expressed that he was  having chest pain prior to coming in.  Dr. Diona Browner saw and assessed him  and suggested he be seen and evaluated in the emergency room.   REVIEW OF SYSTEMS:  Complaints of chest pain, shortness of breath,  palpitations, numbness and tingling down the left arm to the fingers,  GERD.  Otherwise negative.   PAST MEDICAL HISTORY:  1. Palpitations.  2. Dyslipidemia with low HDL.  3. Sleep apnea, not able to tolerate CPAP.  4. Bradycardia since 2005.   PAST CARDIAC WORK UP:  The patient had a cardiac catheterization on  March 04, 2007 revealing mild global hypokinesis, mild aortic root  dilatation, mild to moderate aortic regurgitation, no critical coronary  disease, ejection fraction of 55%.  Echocardiogram with mild relative  hypokinesis of the distal anterior wall and distal lateral wall; grade I  diastolic dysfunction, ejection fraction 55%.   SOCIAL HISTORY:  The patient lives in [place] with his wife.  He is a  Curator.  He stopped smoking in 1970.  No ETOH.   FAMILY HISTORY:  Mother with hypertension.  Father died at age 28 from  an MI and complications of CHF.  He has one brother with hypertension.   CURRENT MEDICATIONS:  1. Verapamil 60 mg b.i.d.  2. Rhinocort p.r.n.   ALLERGIES:  No known drug allergies.   LABORATORY DATA:  Labs are pending.  Chest x-ray is pending.   PHYSICAL EXAMINATION:  VITAL SIGNS:  Blood pressure 165/101, pulse 53,  respirations 18, oxygen saturation 100% on 2L.  HEENT:  Head is normocephalic, atraumatic.  Eyes: PERRLA.  Mucous  membranes of the mouth are pink and moist.  Tongue is midline.  NECK:  Supple. There is no jugular venous distention, no carotid bruits  appreciated.  CARDIOVASCULAR:  Regular rate and rhythm.  Bradycardic with occasional  extrasystole without rubs or gallops.  LUNGS:  Clear to auscultation.  ABDOMEN:  Soft, nontender.  2+ bowel sounds. No abdominal bruits are  appreciated.  EXTREMITIES:  Radial pulses and dorsalis pedis pulses are 1+  bilaterally.  SKIN:  Warm and dry.  NEUROLOGICAL:  Intact.   IMPRESSION:  1. Sinus bradycardia.  2. Chest pain.      a.     Status post cardiac catheterization on March 04, 2007       revealing no critical disease with mild aortic root dilatation.  3. Obstructive sleep apnea, intolerant to CPAP.   PLAN:  The patient has been seen and evaluated by myself and Dr. Charlton Haws in the emergency room.  We will check a CT scan to evaluate  aortic root.  We will discontinue Verapamil as this is probably   contributing to his sinus bradycardia.  We will start an ACE inhibitor  for better blood pressure control and to allow heart rate to normalize -  this will be lisinopril 20 mg p.o. daily.  In the interim, we will do  laboratory work to include a CBC, a BMET and a TSH.  We will consider an  EP evaluation consult if he remains bradycardic with frequent  palpitations.  He does have an outpatient appointment scheduled for  April.  If necessary, we will have them see him during this  hospitalization.      Bettey Mare. Lyman Bishop, NP      Noralyn Pick. Eden Emms, MD, [facility]  Electronically Signed    KML/MEDQ  D:  03/13/2007  T:  03/14/2007  Job:  [id number]   cc:   [facility]

## 2010-05-16 NOTE — Cardiovascular Report (Signed)
NAME:  Patrick Weaver, Patrick Weaver NO.:  [id number]   MEDICAL RECORD NO.:  [id number]          PATIENT TYPE:  OIB   LOCATION:  NA                           FACILITY:  [facility]   PHYSICIAN:  Arturo Morton. Riley Kill, MD, FACCDATE OF BIRTH:  1945-07-06   DATE OF PROCEDURE:  03/04/2007  DATE OF DISCHARGE:                            CARDIAC CATHETERIZATION   INDICATIONS:  Mr. Mccabe is a 65 year old gentleman who has previously  undergone cardiac catheterization, and there has been no evidence of  significant obstruction.  He has had a fair amount of palpitations,  shortness of breath and decreased exercise tolerance. Prior  echocardiogram demonstrated a mildly dilated aortic root.  The current  study was done to assess coronary anatomy because of increasing  palpitations.  His myocardial perfusion study was mildly abnormal, and  the current study was done to assess coronary anatomy.  Of note, on  previous evaluation of the ascending aorta, it was measured 41 mm.  This  was in August 2006.   PROCEDURES:  1. Left heart catheterization  2. Selective coronary territory.  3. Selective left ventriculography.  4. Aortic root aortography.   DESCRIPTION OF PROCEDURE:  The patient was brought to the  catheterization lab and prepped and draped in the usual fashion. Through  an anterior puncture, the femoral artery was easily entered.  A 4-French  sheath was then placed.  Per protocol, views of the left and right  coronary arteries were then obtained in multiple angiographic  projections.  We had to use a JL-5 to access the left coronary artery as  the JL-4 was too small.  A no-toe catheter was used for the right  coronary.  Following coronary injections, central aortic and left  ventricular pressures were measured with a pigtail. Ventriculography was  then performed in the RAO projection.  Following pressure pullback, the  LAO projection was used to assess the aortic root.  There were no  complications, and he was taken to the holding area in satisfactory  clinical condition.  I reviewed the films with his wife in detail.  She  is a Engineer, civil (consulting).   HEMODYNAMIC DATA.:  1. Central aortic pressure was 148/78, mean 102.  2. Left ventricular pressure 140/15.  3. There is no gradient on pullback across the aortic valve.   ANGIOGRAPHIC DATA.:  1. Ventriculography was done in the RAO projection.  Using 4-French      catheters, there was some level of decreased opacification. There      did appear to be mild global hypokinesis, and I would estimate the      ejection fraction in the 45-50% range.  2. The proximal aortic root demonstrates what appears to be a modestly      dilated root.  The exact dimensions are unclear.  There is at least      2+ aortic regurgitation noted by contrast.  3. Left main coronary artery is a large-caliber vessel and free of      critical disease.  There is no significant calcification.  4. The left anterior descending artery may have minimal irregularity  of less than 20% at the origin.  It does not appear to be irregular      or high grade. The remainder of the LAD and diagonal are fine.  5. The circumflex provides a first marginal and second marginal      branch.  The circumflex itself appears to be a fairly large-caliber      vessel.  Neither marginal branch appears to be significantly      compromised  6. The right coronary artery is large-caliber vessel with a slight      anterior takeoff.  The right coronary provides a large posterior      descending and a fairly large posterolateral branch as well.  These      branches appear to be smooth throughout.   CONCLUSION:  1. Mild global hypokinesis as noted above.  2. Mild to moderate aortic root dilatation.  3. Mild to moderate aortic regurgitation.  4. No critical coronary obstruction.   DISPOSITION:  The patient needs close followup with Dr. Tenny Craw.  Two-D  echocardiography is important as well.   The patient may also need a TEE.  There will need to be adjustment in medications as well.      Arturo Morton. Riley Kill, MD, [facility]  Electronically Signed     TDS/MEDQ  D:  03/04/2007  T:  03/04/2007  Job:  [id number]   cc:   Pricilla Riffle, MD, [facility]  CV Laboratory

## 2010-05-16 NOTE — Assessment & Plan Note (Signed)
[facility]                            CARDIOLOGY OFFICE NOTE   NAME:Patrick Weaver, Patrick Weaver                       MRN:          [id number]  DATE:03/25/2008                            DOB:          [birth year]    IDENTIFICATION:  Patrick Weaver is a patient I follow in cardiology clinic.  I last saw him in September.  He is a 65 year old gentleman status post  pacer placement for chronotropic incompetence.  He has had continued  palpitations since.  He is also followed by Dr. Berton Mount.   In addition, the patient has moderate aortic insufficiency and moderate  mitral regurgitation.   Since seen, he continues to have palpitations.   He notes this weekend he was mowing the lawn and he developed a  tightness in his chest, he stopped, it eased off quickly.  He also notes  that a week ago, his wife was in the hospital and he walked briskly  across the [employer] and again developed chest tightness, had  to stop, it eased off quickly.   CURRENT MEDICATIONS:  1. Carvedilol 6.25 daily.  2. Avapro 75 mg daily.   PHYSICAL EXAMINATION:  GENERAL:  The patient is in no distress at rest.  VITAL SIGNS:  Blood pressure is 119/72, pulse 61, weight 201.  NECK:  JVP is normal.  LUNGS:  Clear.  No rales.  CARDIAC:  Regular rate and rhythm.  S1 and S2, occasional skip.  No S3,  S4, murmurs.  2/6 systolic murmur heard best at the apex.  ABDOMEN:  Benign.  EXTREMITIES:  No edema.   IMPRESSION:  1. Palpitations.  The patient had a 12-lead EKG with rhythm strip      done, there were frequent PACs.  In addition, there was four beats      of a narrow complex, wide complex, narrow complex, narrow complex.      I have discussed this with Dr. Berton Mount and also Dr. Hillis Range.  I am not sure if this represented PACs or PVCs.  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  After  discussion      with Dr. Graciela Husbands and Dr. Johney Frame, the plan is to place the patient on      flecainide 100 b.i.d.  He will be this on Sunday.  The patient will      have a treadmill with Myoview on Thursday to evaluate for inducible      ischemia and arrhythmia exacerbation.  I will set to see the      patient back in 4 weeks to assess his progress.  2. Chest pressure.  Again, I am not convinced that this is ischemia.      The patient had near normal catheter a year ago.  He does have      mitral regurgitation possibly in the setting of PVCs plus increased  heart rate may have increased left atrial pressures, pulmonary      pressures.  3. Valvular disease.  The patient will need to have followup for      these.  Echocardiogram in March 2009 showed moderate aortic      insufficiency, moderate mitral insufficiency.     Pricilla Riffle, MD, [facility]  Electronically Signed    PVR/MedQ  DD: 03/25/2008  DT: 03/26/2008  Job #: [id number]

## 2010-05-16 NOTE — Assessment & Plan Note (Signed)
[facility]                            CARDIOLOGY OFFICE NOTE   Patrick Weaver, Patrick Weaver                         MRN:          [id number]  DATE:01/08/2007                            DOB:          02/25/1945    PRIMARY CARE PHYSICIAN:  Pricilla Riffle, MD, [facility]   Patrick Weaver is a 65 year old white male patient of Dr. Tenny Craw who has a  long history of palpitations, PVCs and couplets.  She last saw him in  April of 2008, at which time he was having trouble taking his Cardizem,  and she had cut him back to short-acting 30 mg.  He complains of  dizziness and headaches.  Somehow, he got switched to verapamil 120 mg  b.i.d. one-half b.i.d. and says that later in the day it does give him a  headache and occasionally some dizziness.  He did have a follow-up  Holter in June of 2008 which showed sinus rhythm for 46-92 beats per  minute, average of 66, occasional PVCs, PACs, rare couplets.  The  longest PAT was 6 beats.  He has also had a prior cardiac  catheterization back in 2005 that showed normal coronary arteries and LV  function.  His father has had a dilated cardiomyopathy, and Dr. Tenny Craw  thought we would check an echo in a few years.   The patient complains about a month's worsening of his symptoms.  He  complains of significant palpitations throughout the day.  He does not  feel like the verapamil is working as it should, but he does admit to  being under an increased amount of stress.  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  He drinks  soda in the morning and the two cups of iced tea at noon.  He tries to  make that decaffeinated when he is home, but when he is out it is not  decaffeinated.  Otherwise, he has no extra caffeine.  He says on  Christmas day when he walked from his house about three blocks to his  shop, he said he developed shortness of breath and tightening in his  chest when his heart was just racing  so fast that it has been difficult  to exercise.  He has been on beta blockers in the past, particularly  Toprol, and did not tolerate this medication either.   CURRENT MEDICATIONS:  Verapamil 120 mg one-half b.i.d.   PHYSICAL EXAMINATION:  GENERAL:  This is an anxious 65 year old white  male in no acute distress.  VITAL SIGNS:  Blood pressure 130/81, pulse 63, weight 201.  NECK:  Without JVD, HJR, question of right carotid bruit.  LUNGS:  Clear anterior, posterior and lateral.  HEART:  Regular rate and rhythm at 60 beats per minute.  Normal S1 and  S2.  No murmur, rub, bruit, thrill or heave noted.  ABDOMEN:  Soft without organomegaly, masses, lesions or abnormal  tenderness.  EXTREMITIES:  Without cyanosis, clubbing or edema.  He has good distal  pulses.   IMPRESSION:  1. Long  history of palpitations that seems to be getting worse, felt      secondary to PAT, PVCs and PAC on prior Holters.  2. Anxiety and stress.  3. Medication intolerance.  4. Father has a history of dilated cardiomyopathy.  5. History of normal coronary arteries in 2005.   PLAN AT THIS TIME:  The patient states when he was taking verapamil once  a day at night it seemed to help him better.  I have given him a new  prescription for the verapamil 120 mg and told him to try to take a  whole tablet and then see how this works.  I told him he can take a  whole tablet b.i.d. if he can tolerate it, although I doubt that he  will.  He prefers proceeding with the stress test to rule out any  underlying cause.  I will not order another Holter since just had one  less than a year old, as well as a TSH.  Will have him see Dr. Tenny Craw back  in follow-up after his stress Myoview.  I have also asked him to  decrease his caffeine intake.      Patrick Reedy, PA-C  Electronically Signed      Veverly Fells. Excell Seltzer, MD  Electronically Signed   ML/MedQ  DD: 01/08/2007  DT: 01/08/2007  Job #: [id number]

## 2010-05-16 NOTE — Discharge Summary (Signed)
NAME:  MOHAMEDAMIN, NIFONG NO.:  [id number]   MEDICAL RECORD NO.:  [id number]          PATIENT TYPE:  OIB   LOCATION:  3703                         FACILITY:  [facility]   PHYSICIAN:  Duke Salvia, MD, FACCDATE OF BIRTH:  1945/01/12   DATE OF ADMISSION:  03/17/2007  DATE OF DISCHARGE:  03/18/2007                               DISCHARGE SUMMARY   This patient has no known drug allergies.   TIME FOR THIS DICTATION AND EXAM:  Greater than 35 minutes.   FINAL DIAGNOSES:  1. Discharging day #1, status post implant of a Medtronic Adapta dual-      chamber pacemaker.  2. Pacemaker implanted for sinus node dysfunction/symptomatic      bradycardia/question of chronotropic incompetence.  3. History of palpitation.  4. Abnormal finding on computed tomography study of the chest, March 13, 2007.   SECONDARY DIAGNOSES:  1. Obstructive sleep apnea (not tolerating continuous positive airway      pressure)  2. Hypertension.   PROCEDURE:  1. On March 17, 2007, implant of a Medtronic Adapta dual-chamber      pacemaker for sinus node dysfunction.  Dr. Sherryl Manges.  The      patient had no postprocedural complications.  2. Would call attention to computed tomogram of March 13, 2007.  This      study showed:  1.  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  Scattered subcentimeter pulmonary nodules      throughout both lungs, largest nodules measure up to 4 mm,      recommend at least 12 on followup; and 3.  Fatty liver.   BRIEF HISTORY:  Mr. Muilenburg is a 65 year old male who has a history of  chest pain.  He had a cardiac catheterization March 04, 2007.  This study  showed nonobstructive coronary artery disease.   Since catheterization, he has continued to have intermittent chest  discomfort and palpitations, as well as dyspnea on exertion.  This  occurs after walking less than 20 feet.  His symptoms resolved with  rest.  He presented back to the  emergency room March 13, 2007.  He was  noted to be bradycardic, with a heart rate in the 40s and 50s.  The  patient was admitted.  He was ruled out for myocardial infarction.  He  has been evaluated for pacemaker placement.   It is felt that the patient may suffer from chronotropic incompetence  and would benefit from pacemaker implantation.  He will return to [facility] March 17, 2007 for pacemaker implantation.   HOSPITAL COURSE:  The patient presents electively March 17, 2007.  He  underwent implantation of the dual-chamber pacemaker, Medtronic Adapta,  for sinus node dysfunction.  The patient has had intermittent atrial  pacing.  The base rate of the a pacemaker is 60, high rate is 130.  The  patient is discharging with prescription for Darvocet-N 100, 1-2 tablets  q.4 h. for discomfort at the pacemaker site.  Pacemaker site itself is  without hematoma or ecchymoses.  The patient has been advised to keep  his arm quiet for next 4 days, and to keep the incision dry for the next  week.  He is asked not to drive for 1 week.   DISCHARGE MEDICATIONS:  1. Lisinopril 20 mg daily.  2. Darvocet-N 100, 1-2 tabs q.4 h. as needed for pain.   He has followup at [facility], [address]:  1. Pacer Clinic, Monday March 31, 2007 at 9:00.  2. A treadmill study has been arranged for Friday April 25, 2007 at      11:15.  Dr. Graciela Husbands will optimize pacer function with regard to      exercise tolerance during that study.  3. Appointment is to see Dr. Graciela Husbands Thursday July 03, 2007 at 9:10 in      the morning for reinterrogation of the patient's pacemaker.   Lab studies pertinent to this admission were drawn March 13, 2007.  Sodium 137, potassium 3.6, chloride 106, bicarbonate 25, glucose 145,  BUN is 13, creatinine 1.14.  Complete blood count:  White cells are 5.6,  hemoglobin 14.8, hematocrit 42.2, and platelets of 144.  The thyroid-  stimulating hormone was  2.053.      Maple Mirza, [place]      Duke Salvia, MD, [facility]  Electronically Signed    GM/MEDQ  D:  03/18/2007  T:  03/18/2007  Job:  [id number]   cc:   Pricilla Riffle, MD, [facility]@[place]  Lanell Matar, MD,FCCP

## 2010-05-16 NOTE — Assessment & Plan Note (Signed)
[facility]                            CARDIOLOGY OFFICE NOTE   NAME:Patrick Weaver, Patrick Weaver                       MRN:          [id number]  DATE:09/18/2007                            DOB:          1945-12-09    IDENTIFICATION:  Mr. Limes is a 65 year old gentleman.  He is now  status post pacer placement for chronotropic incompetence.  He also has  a history of extensive ectopy.  I saw him last in August 2009.  He was  having problems with palpitations at that time.  He had previously been  seen by Dr. Berton Mount.  I placed him on lisinopril 10 (lisinopril had  actually been higher as lisinopril HCTZ 20/12.5) and I continued  atenolol increasing to 50 per day.   The patient is currently on atenolol 25 daily.  He said was 50 daily, he  developed severe headache. He has also noticed he has had a cough,  question lisinopril.  He is still having significant palpitations.   CURRENT MEDICINES:  Atenolol 25 daily and lisinopril 10 daily.   PHYSICAL EXAMINATION:  GENERAL:  On exam, the patient is in no acute  distress.  VITAL SIGNS:  Blood pressure is 126/88, pulse is 56 with occasional  skips, weight 201, stable.  LUNGS:  Clear.  CARDIAC:  Regular rate and rhythm with occasional skip.  S1 and S2.  No  S3.  No significant murmurs.  ABDOMEN:  No hepatomegaly.  EXTREMITIES:  No edema.   A 12-lead EKG not done.   Pacer interrogation shows 438,000 PVCs in the past month.   I told the patient, stop Lisinopril began Avapro first take half of 150  for a couple of days and then take the whole the 150.   I will be in touch with Dr. Berton Mount what to try next for beta-  blocker or other therapy for his skips.  I have told him, I will be in  touch with him.  He has an appointment to see Dr. Graciela Husbands in 1 month.  Hopefully, we can make a change and then assess the response.     Pricilla Riffle, MD, [facility]  Electronically Signed    PVR/MedQ  DD: 09/19/2007  DT:  09/19/2007  Job #: [id number]

## 2010-05-16 NOTE — Op Note (Signed)
NAME:  Patrick Weaver, Patrick Weaver NO.:  [id number]   MEDICAL RECORD NO.:  [id number]          PATIENT TYPE:  OIB   LOCATION:  2807                         FACILITY:  [facility]   PHYSICIAN:  Duke Salvia, MD, FACCDATE OF BIRTH:  08-13-1945   DATE OF PROCEDURE:  03/17/2007  DATE OF DISCHARGE:                               OPERATIVE REPORT   PREOPERATIVE DIAGNOSIS:  Sinus node dysfunction with chronotropic  incompetence.   POSTOPERATIVE DIAGNOSIS:  Sinus node dysfunction with chronotropic  incompetence.   Following obtaining informed consent, the patient was brought to the  electrophysiology laboratory and placed on the fluoroscopic table in  supine position.  After routine prep and drape of the left upper chest,  lidocaine was infiltrated in the prepectoral subclavicular region.  Incision was made and carried down to the layer of the prepectoral  fascia using electrocautery and sharp dissection.  A pocket was formed  similarly.  Hemostasis was obtained.   Thereafter attention was turned to gaining access to the extrathoracic  left subclavian vein, which was accomplished without difficulty without  the aspiration of air or puncture of the artery.  Two separate  venipunctures were accomplished.  Guidewires were placed and retained  and sequentially 7-French introducer sheaths were passed, through which  was passed a Medtronic 5076 58-cm active-fixation ventricular lead,  serial number [id number], and an active-fixation atrial lead, serial  number [id number].  The ventricular lead was marked with a tie.   Attempts were made initially to place the lead upon the right  ventricular septum.  This turned out to be, while not all that  difficult, associated with poor current of injury on at least three  deployment.  Because of this I moved the lead to the right ventricular  apex as the patient's primary problem was sinus node dysfunction and he  was primarily going to be pa going  to be paced atrially anyway.  Even in  the right ventricular apex we had three lead deployments to find an area  where the current of injury was adequate.  In this location the bipolar  R wave was 10 with a pace impedance of 1288 ohms, a threshold 0.2 V at  0.5 msec.  Current at threshold was 1.0 mA.  There was no diaphragmatic  pacing at 10 V.   We had the same trouble in the right atrium.  Multiple sites were mapped  in the right atrial appendage around the right atrial appendage and as  high as I could to stay relatively close to Bachmann's bundle.  We  finally had a location after about 5 lead deployments where the current  of injury was adequate.  The P wave was 2.5 with a pace impedance of 628  and a threshold of 0.5 V of 0.5 msec.  Current at threshold was 1.0 mA.  This these leads were secured to the prepectoral fascia and a 2-0 silk  suture was placed in a figure-of-eight fashion around the two leads as a  hemostatic suture.  The leads were then attached to a Medtronic Adapta  ADDDRL1 pulse generator,  serial number [id number].  Ventricular pacing  and then AV pacing was identified.  The pocket was copiously irrigated  with antibiotic-containing saline solution, hemostasis was assured, and  the leads and the pulse generator placed in the pocket, secured to the  prepectoral fascia.  The wound was closed in three layers in the normal  fashion.  The wound  was washed and dried and a benzoin and Steri-Strip dressing was applied.  Needle counts, sponge counts and instrument counts were correct at the  end of the procedure according to the staff.  The patient tolerated the  procedure without apparent complication.      Duke Salvia, MD, [facility]  Electronically Signed     SCK/MEDQ  D:  03/17/2007  T:  03/17/2007  Job:  [id number]   cc:   Electrophysiology Laboratory  [facility]  Dr. Sondra Come at [facility]

## 2010-05-16 NOTE — Assessment & Plan Note (Signed)
[facility]                            CARDIOLOGY OFFICE NOTE   NAME:DILLONDennise, Bamber                         MRN:          [id number]  DATE:02/28/2007                            DOB:          28-Feb-1945    IDENTIFICATION:  The patient is a 65 year old gentleman that I have seen  in the past.  He has a history of  palpitations, PVCs, PACs, couplets.  He has normal coronary arteries on cath in 2005, LVEF on echo with 50-  55%.   The patient was seen in clinic on January 7 by Herma Carson.  He noted  a spell on Christmas Day where he was walking from the house three  blocks to his shop.  He developed shortness of breath and tightness when  his heart began racing.  He  has been intolerant to beta blockers and  actually gets very weak on calcium channel blockers.   With these symptoms and the complaint that his palpitations seem to be  getting worse, he was set up for labs as well as a stress Myoview.   Today he presented for the Myoview scan.  He did take his verapamil (see  60 mg short-acting this a.m.).   Initially on the treadmill it was stopped because of ectopy.  On  reassessment he was restarted.  He walked a total of 8 minutes 4 seconds  to a peak heart rate of 96.  He developed chest tightness 5-6/10 that  eased off on recovery.  On review of the EKGs there was frequent ectopy  that looked like PACs, some PVCs.  There were coupled ectopic beats, but  I am not convinced of nonsustained VT that was captured.  His EKG  otherwise at baseline T-wave inversion in the inferior and lateral  leads.  This actually appeared to normalize in recovery.  With this the  patient was switched to adenosine.  Myoview scan was done   In review of the Myoview images, there appears to be an inferoseptal  defect base mid minimally distal that improves partially.  LVEF on  gating was 41% but may indeed be of affected by the ectopy.   I have reviewed his case with  Berton Mount.   PLAN:  Based on above with the abnormal heart rate response, ectopy and  symptoms and abnormal Myoview to proceed with cardiac catheterization,  rule out ischemia.  If this is negative then EP evaluation regarding his  ectopy.   ALLERGIES:  None.   MEDICATIONS:  Verapamil 60 mg b.i.d., Rhinocort p.r.n.   PAST MEDICAL HISTORY:  1. Palpitations. Other past medical history dyslipidemia with low HDL.      The patient failed Niaspan.  2. History of sleep apnea and does not tolerate CPAP in the past.   SOCIAL HISTORY:  He is married, lives in [place], quit tobacco in  1970, does not drink.   FAMILY HISTORY:  Mother with hypertension.  Father died at age 47, had  CHF, MI.  One brother deceased had hypertension.   REVIEW OF SYSTEMS:  All systems reviewed negative to  the above problem  except as noted above.   PHYSICAL EXAM:  Blood pressure today 146/82, pulse 64.  NECK:  No JVD.  LUNGS are clear to auscultation.  CARDIAC exam regular rate and rhythm with frequent skips, S1-S2 no  murmurs.  ABDOMEN:  Benign.  EXTREMITIES:  No edema.  Good pulses.   IMPRESSION:  Mr. Vasco is a 65 year old gentleman who has a long  history of palpitations and ectopy, presents with some concerning  symptoms.  Myoview scan is not normal and an abnormal heart rate  response (even given that he had a low dose of verapamil).  I have  discussed with Berton Mount, recommend left heart catheterization to rule  out ischemic heart disease.  Again he had a normal catheterization in  2005. If this is negative then further eval per EP.     Pricilla Riffle, MD, [facility]  Electronically Signed    PVR/MedQ  DD: 02/28/2007  DT: 03/01/2007  Job #: [id number]

## 2010-05-19 NOTE — Assessment & Plan Note (Signed)
[facility]                            CARDIOLOGY OFFICE NOTE   NAME:Sawa, KERRIE LATOUR                       MRN:          [id number]  DATE:08/04/2007                            DOB:          [birth year]    Mr. Thiam is a 65 year old gentleman who I have followed in clinic.  He  was last seen actually by Dr. Berton Mount on July 03, 2007, status post  pacer implant for chronotropic incompetence.  His energy level is much  improved.   When he called Dr. Brett Canales, his pacer was interrogated and there were  about 1 million PVCs noted.  He was given a prescription for 3 different  beta blockers Inderal LA 60, nadolol 20, and atenolol 50.  He has begun  the atenolol one-half of the 50 (25 mg) 3 days ago.  He continues on his  lisinopril and hydrochlorothiazide.   He says he still senses the palpitations, over the past few days it  might be a little bit less.  He has some spells of dizziness.  No  syncope.  Overall, his energy is better.   CURRENT MEDICATIONS:  1. Lisinopril and hydrochlorothiazide 20/12.5.  2. Atenolol 25.   PHYSICAL EXAMINATION:  GENERAL:  The patient is in no distress.  VITAL SIGNS:  Blood pressure 105/64, pulse is 62 and regular, and weight  200.  LUNGS:  Clear.  CARDIAC EXAM:  Regular rate and rhythm, occasional skip S1-S2, no S3, no  murmurs.  ABDOMEN:  Benign.  EXTREMITIES:  No edema.   Labs from last visit, potassium of 3.9 and magnesium of 2.3.   A 12-lead EKG, sinus rhythm with atrial pacing spikes seen.   Pacer interrogation showed 551,079 beat since last interrogated.   IMPRESSION:  Ectopy, I have asked the patient to cut back on his  lisinopril and hydrochlorothiazide.  I will actually stop it and put him  on lisinopril only 10 mg, may indeed back down to 5.  He should continue  on the atenolol 25 daily may increase to 50, but I would like to follow  his blood pressure.  If he notices at home, he is still having  palpitations.  He can again back the lisinopril down to 5, increase the  atenolol to 50.  I think it is a little premature now to switch him over  to the other beta blockers without giving them a fair try.   I will set to see the patient back in 4-6 weeks to evaluate his  progress.  He will call if there are any further problems.     Pricilla Riffle, MD, [facility]  Electronically Signed    PVR/MedQ  DD: 08/05/2007  DT: 08/06/2007  Job #: [id number]

## 2010-05-19 NOTE — H&P (Signed)
NAME:  Patrick Weaver, STIEF NO.:  [id number]   MEDICAL RECORD NO.:  [id number]                   PATIENT TYPE:  EMS   LOCATION:  MAJO                                 FACILITY:  [facility]   PHYSICIAN:  Pricilla Riffle, M.D.                 DATE OF BIRTH:  [birth year]   DATE OF ADMISSION:  01/11/2003  DATE OF DISCHARGE:                                HISTORY & PHYSICAL   HISTORY OF PRESENT ILLNESS:  The patient is a 65 year old gentleman who is  being evaluated in the emergency room for evaluation of chest pain.   The patient has no known coronary artery disease.  In talking to the wife  and the patient, he has had episodes of chest tightness with activity that  have been only intermittent and relieved with rest.   Last week, he was home and he was again developing URI symptoms with ache,  cough with greenish sputum production.  He was seen on Friday by Maryla Morrow.  Modesto Charon, M.D., his primary physician.  At that time, he also noted that he had  been experiencing chest pressure.  He described the pressure as heaviness,  not associated with any coughing.  He feels that he cannot get a breath.  It  occurs with activity and relieved with rest.   An EKG was done in Dr. Nash Dimmer office that showed sinus rhythm with PVC.  Note, in the lateral leads there was slight ST depression with biphasic T  waves noted, question ischemia versus post PVC.   The patient was encouraged to come to the emergency room, but denied.  He  began a course of antibiotics and says his sputum production cleared, but he  still continues to have some cough and he continued to have chest pressure  with exertion.  He was brought in by his wife this morning.  The last  episode occurred here in the ER, lasted about two minutes, relieved with  rest, and it was about a 5 out of a scale of 10 in intensity.  With  nitroglycerin, it was relieved, though, may have been again with rest.   The patient denies any  PND-like spells.  No palpitations.  No syncope.   ALLERGIES:  No known drug allergies.   CURRENT MEDICATIONS:  1. Antibiotics, two types, just finished one, question dose.  2. Aspirin 325 mg two daily x3 days.  3. Over-the-counter cough syrup.   PAST MEDICAL HISTORY:  1. Recent URI.  2. Dyslipidemia with decreased HDL, LDL of 108, HDL 31, total cholesterol     169 with increased LDL particle number to 1573 small size.  The patient     failed Niaspan therapy.  3. History of sleep apnea, could not tolerate sleep CPAP.   SOCIAL HISTORY:  The patient is married.  Lives in [place].  Is an Psychologist, prison and probation services.  Three children healthy.  Quit smoking in 1970.  Does not drink  and does not exercise often.   FAMILY HISTORY:  Mother alive at age 57 with hypertension.  Father deceased  at age 78, had CHF, MI. Death of one brother with hypertension.   REVIEW OF SYSTEMS:  CONSTITUTIONAL:  Weight gain 10 pounds over the past  three years.  HEENT:  Wears reading glasses.  CARDIOVASCULAR:  As noted.  PULMONARY:  As noted above.  Notes some more coughing since Saturday, but  nonproductive now. GENITOURINARY:  Negative.  MUSCULOSKELETAL:  Negative.  GASTROINTESTINAL:  Negative.  ENDOCRINE:  Dyslipidemia as noted.  Otherwise,  all other systems reviewed negative except as noted.   PHYSICAL EXAMINATION:  GENERAL:  The patient is in no acute distress.  Does  have some dry cough.  Denies chest pressure at present.  VITAL SIGNS:  Blood pressure 137/82, pulse 53.  Temperature 98.  NECK:  JVP is normal, no bruits, no thyromegaly.  LUNGS:  Relatively clear after cough.  HEART:  Regular rate and rhythm, S1 and S2, no S3 or S4.  No murmurs.  ABDOMEN:  No hepatomegaly, supple.  EXTREMITIES:  2+ distal pulses throughout, no lower extremity edema.  NEUROLOGY:  Grossly intact.   Chest x-ray; no acute disease.  12-lead EKG shows normal sinus rhythm with a  rate of 62 beats per minute with frequent PVC's.    IMPRESSION:  The patient is a 65 year old gentleman with no known CAD.  He  presents with about a five-day history of URI type symptoms , but also has  chest pressure that is exertional in nature.  Chest x-ray shows no evidence  for pneumonia.  Examination is unremarkable.  EKG shows no acute ST changes,  does have frequent PVC's.  He did have ST changes in the office on Friday  (Dr. Modesto Charon), question if ischemic versus post PVC.   The most concerning thing in talking to him about his history, is the fall  he had, episodes of chest tightness with activity relieved with rest, mild  intermittent.  Question if this is a progression of that or if it is related  to the URI.  Would recommend waiting for enzymes and other labs.  Will  discuss with the family possible intervention/evaluation with noninvasive  evaluation versus catheterization.  He will begin beta blocker, Lovenox,  begin a statin for lipids.  Admit to a telemetry bed.                                                Pricilla Riffle, M.D.    PVR/MEDQ  D:  01/11/2003  T:  01/11/2003  Job:  [id number]

## 2010-05-19 NOTE — Cardiovascular Report (Signed)
NAME:  Patrick Weaver, Patrick Weaver NO.:  [id number]   MEDICAL RECORD NO.:  [id number]                   PATIENT TYPE:  INP   LOCATION:  3729                                 FACILITY:  [facility]   PHYSICIAN:  Carole Binning, M.D. [facility]         DATE OF BIRTH:  Sep 29, 1945   DATE OF PROCEDURE:  01/12/2003  DATE OF DISCHARGE:                              CARDIAC CATHETERIZATION   PROCEDURE PERFORMED:  Left heart catheterization with coronary angiography  and left ventriculography.   INDICATIONS:  Mr. Riling is a 65 year old male admitted with symptoms of  palpitations and chest pain.  He was noted to have frequent PVCs.  He was  referred for cardiac catheterization to rule out coronary artery disease.   PROCEDURAL NOTE:  A 6-French sheath was placed in the right femoral artery.  Coronary angiography was performed with 6-French JL4 and JR4 catheters.  Left ventriculography was performed with an angled pigtail catheter.  Contrast was Omnipaque.  There were no complications.   RESULTS:  HEMODYNAMICS:  Left ventricular pressure 125/17.  Aortic pressure 124/65.  There is no  aortic valve gradient.   LEFT VENTRICULOGRAM:  Wall motion is normal.  Ejection fraction estimated at greater than or equal  to 55%.  There is no mitral regurgitation.   CORONARY ARTERIOGRAPHY:  (Right dominant)  Left main is normal.   Left anterior descending artery gives rise to a single, very large diagonal  branch.  The LAD is normal.   Left circumflex gives rise to a normal sized first marginal branch, normal  sized second marginal branch, and a small third obtuse marginal branch.  The  left circumflex is normal.   Right coronary artery is a dominant vessel giving rise to a normal sized  posterior descending artery, a normal sized first posterolateral branch, and  a small second posterolateral branch.   IMPRESSION:  1. Normal left ventricular systolic function.  2. Normal coronary  arteries.                                               Carole Binning, M.D. [facility]    MWP/MEDQ  D:  01/12/2003  T:  01/12/2003  Job:  [id number]   cc:   Thelma Barge P. Modesto Charon, M.D.  [address]  [place]  [place] [address]  Fax: [phone]   Cath Lab

## 2010-05-19 NOTE — Procedures (Signed)
NAME:  Patrick Weaver, BRASE NO.:  [id number]   MEDICAL RECORD NO.:  [id number]          PATIENT TYPE:  OUT   LOCATION:  SLEEP CENTER                 [facility]ACILITY[facility]   PHYSICIAN:  Barbaraann Share, MD,FCCPDATE OF BIRTH:  04/05/1945   DATE OF STUDY:  01/21/2006                            NOCTURNAL POLYSOMNOGRAM   REFERRING PHYSICIAN:  Barbaraann Share, MD   INDICATION FOR STUDY:  Hypersomnia with sleep apnea.  The patient  returns for CPAP pressure optimization.   EPWORTH SLEEPINESS SCORE:  8.   SLEEP ARCHITECTURE:  The patient had a total sleep time of 329 minutes  with borderline quantity of REM and very little slow-wave sleep.  Sleep  onset latency was normal.  REM onset was increased.  Sleep efficiency  was decreased at 83%.   RESPIRATORY DATA:  The patient underwent a CPAP titration study,  initially with his mask from home.  This was, however, changed over to a  medium Quattro full face mask due to discomfort.  The patient liked this  mask much better and CPAP titration was initiated.  At a final pressure  of 1 cm there was excellent control of his obstructive events even  during REM.   OXYGEN DATA:  His O2 saturation was as low as 85% prior to achieving  optimal CPAP pressure.  The patient maintained excellent O2 saturations  after that point.   CARDIAC DATA:  Occasional PVC.   MOVEMENT-PARASOMNIA:  None.   IMPRESSIONS-RECOMMENDATIONS:  1. Good control of previously-documented obstructive sleep apnea with      a CPAP pressure of 11 cmH2O.  A ResMed medium Quattro full face      mask was used for the titration.  2. Occasional PVC without clinically significant arrhythmia.      Barbaraann Share, MD,FCCP  Diplomate, American Board of Sleep  Medicine  Electronically Signed    KMC/MEDQ  D:  02/06/2006 14:08:45  T:  02/06/2006 14:26:43  Job:  [id number]

## 2010-05-19 NOTE — Discharge Summary (Signed)
NAME:  Patrick Weaver, Patrick Weaver NO.:  [id number]   MEDICAL RECORD NO.:  [id number]                   PATIENT TYPE:  INP   LOCATION:  3729                                 FACILITY:  [facility]   PHYSICIAN:  Pricilla Riffle, M.D.                 DATE OF BIRTH:  01-26-1945   DATE OF ADMISSION:  01/11/2003  DATE OF DISCHARGE:  01/12/2003                           DISCHARGE SUMMARY - REFERRING   PROCEDURES:  Cardiac catheterization on January 11.   REASON FOR ADMISSION:  Please refer to dictated admission note.   LABORATORY DATA:  Normal serial cardiac markers.  Normal CBC on admission.  Normal electrolytes and renal function.  Elevated glucose at 114 on  admission. TSH 0.73.   Admission chest x-ray:  No acute disease.   HOSPITAL COURSE:  Following presentation to the emergency room with  complaint of chest pain, the patient was admitted by Dr. Dietrich Pates for  further diagnostic evaluation.  The patient presented with no previous  history of coronary artery disease but with noted dyslipidemia, history of  sleep apnea, and remote tobacco smoking.   All serial cardiac markers were normal.   Cardiac catheterization, performed by Dr. Loraine Leriche Pulsipher on morning of  discharge, revealed normal coronary arteries and a normal left ventricle.  (See report for full details.)   Dr. Gerri Spore initiated low-dose Toprol at 12.5 daily for treatment of  documented PVCs.  No nonsustained ventricular tachycardia was noted.  Dr.  Gerri Spore reasoned that the PVCs may be due to recent treatment with  Macrolide antibiotics.   The patient had no complications of right groin on examination prior to  discharge.   DISCHARGE MEDICATIONS:  1. Toprol XL 12.5 mg daily.  2. Zocor 40 mg q.h.s.  3. Aspirin 81 mg daily.   DISCHARGE INSTRUCTIONS:  1. No heavy lifting/driving x 2 days.  2. Maintain low-fat, low-cholesterol diet.  3. Call the office if there is any swelling/bleeding of the  groin.  4. The patient is scheduled to follow up with Dr. Dietrich Pates on Friday,     February 4, at 12 noon.   DISCHARGE DIAGNOSES:  1. Nonischemic chest pain.     a. Normal serial cardiac markers.     b. Normal coronary angiogram on January 11.  2. Ventricular ectopy.  3. Dyslipidemia.  4. Status post bronchitis.      Gene Serpe, P.A. [facility]                      Pricilla Riffle, M.D.    GS/MEDQ  D:  01/12/2003  T:  01/12/2003  Job:  [id number]   cc:   Thelma Barge P. Modesto Charon, M.D.  [address]  [place]  [place] [address]  Fax: [phone]

## 2010-05-19 NOTE — Assessment & Plan Note (Signed)
[facility]                            CARDIOLOGY OFFICE NOTE   NAME:DILLONMillion, Maharaj                         MRN:          [id number]  DATE:05/01/2006                            DOB:          1945-09-17    IDENTIFICATION:  Mr. Carrier is a 65 year old gentleman with a history of  palpitations, PVCs, and couplets.  I last saw him in clinic back in  2006.   In interval, he has been seen by Marcelyn Bruins, and is using the CPAP,  tolerating for about 4 hours at a time.   The patient still gets palpitations.  He takes 1/2 of a verapamil (CD  120 mg).  He opens it up to take it, but he says occasionally he will  have dizziness occur 1 to 2 times per month.  He has not tried skipping  it.   He drinks tea in the afternoon, 2 to 3 glasses, but otherwise does not  drink a lot of fluid.   CURRENT MEDICATIONS:  Includes Cardizem CD 120 (part of).   PHYSICAL EXAMINATION:  Patient is in no distress.  Blood pressure 121/71 on arrival.  Orthostatic's:  Blood pressure  122/69, pulse of 47 sitting, 131/71, pulse of 49 standing, at zero  minutes 134/76, pulse 54, 2 minutes 136/75, pulse 54, 5 minutes 127/74,  pulse 54.  NECK:  JVP is normal.  No bruits.  LUNGS:  Clear.  CARDIAC EXAM:  Regular rate and rhythm.  S1 and S2.  No S3.  No murmurs.  ABDOMEN:  Benign.  EXTREMITIES:  No edema.   IMPRESSION:  1. Palpitations.  Back when he wore the Holter monitor in the past,      there were occasional premature atrial contractions, rare      paroxysmal atrial tachycardia, longest 5 beats.  His heart rate is      a little bit on the slow side.  I have given him a prescription for      Cardizem short acting, 30 mg.  He can try cutting this in parts,      and taking 1 or 2 a day.  I will check a TSH today, and also set      him up for a Holter monitor.  2. Healthcare maintenance.  We will set him up for a fasting lipid      panel at this convenience.  Again, he had a low  HDL.  3. Family history.  Father had a dilated cardiomyopathy.  On exam      today, I do not see signs of volume overload or left ventricular      dysfunction.  We will schedule echocardiogram in a few years,      unless symptoms change.   Twelve-lead EKG:  Sinus bradycardia of 51 beats per minute.  Non-T wave  inversion in V5 and V6, unchanged from previous.     Pricilla Riffle, MD, [facility]  Electronically Signed    PVR/MedQ  DD: 05/01/2006  DT: 05/01/2006  Job #: [id number]   cc:   Thelma Barge P. [place]esto Charon, M.D.

## 2010-06-06 ENCOUNTER — Encounter: Payer: Self-pay | Admitting: Internal Medicine

## 2010-06-12 ENCOUNTER — Telehealth: Payer: Self-pay | Admitting: Internal Medicine

## 2010-06-12 MED ORDER — FLECAINIDE ACETATE 150 MG PO TABS: ORAL_TABLET | ORAL | Status: DC

## 2010-06-12 NOTE — Telephone Encounter (Signed)
Pt is completely out of flecinide and needs it called into [facility] on [address] pls call so they can get it on automatic refill

## 2010-06-12 NOTE — Telephone Encounter (Signed)
Will route to refill pool. 

## 2010-06-29 ENCOUNTER — Encounter: Payer: Self-pay | Admitting: Gastroenterology

## 2010-07-07 ENCOUNTER — Encounter: Payer: Self-pay | Admitting: Internal Medicine

## 2010-07-07 ENCOUNTER — Ambulatory Visit (INDEPENDENT_AMBULATORY_CARE_PROVIDER_SITE_OTHER): Payer: BC Managed Care – PPO | Admitting: Internal Medicine

## 2010-07-07 DIAGNOSIS — I498 Other specified cardiac arrhythmias: Secondary | ICD-10-CM

## 2010-07-07 DIAGNOSIS — I4949 Other premature depolarization: Secondary | ICD-10-CM

## 2010-07-07 DIAGNOSIS — I495 Sick sinus syndrome: Secondary | ICD-10-CM

## 2010-07-07 DIAGNOSIS — Z950 Presence of cardiac pacemaker: Secondary | ICD-10-CM

## 2010-07-07 DIAGNOSIS — I2510 Atherosclerotic heart disease of native coronary artery without angina pectoris: Secondary | ICD-10-CM

## 2010-07-07 LAB — PACEMAKER DEVICE OBSERVATION
ATRIAL PACING PM: 80
BAMS-0001: 175 {beats}/min
RV LEAD IMPEDENCE PM: 486 Ohm
RV LEAD THRESHOLD: 0.5 V
VENTRICULAR PACING PM: 1

## 2010-07-07 NOTE — Assessment & Plan Note (Signed)
The patient's device was interrogated.  The information was reviewed. No changes were made in the programming.    

## 2010-07-07 NOTE — Progress Notes (Signed)
  HPI  Patrick Weaver is a 65 y.o. male seen in followup for pacer implantation for bradycardia. He has also had significant problems with pvcs. This has been treated with flecanide with much improvement.  The patient denies SOB, chest pain, edema or palpitations    Myoview scan last December demonstrated ejection fraction of 44% with global hypokinesis.  Aortic regurgitation Mild to moderate regurgitation directed centrally        in the LVOT. Regurgitation pressure half-time: . Aorta: Ascending aortic aneurysm. Aortic root dimension: 41mm        (ED). Ascending aortic diameter: 47mm  This was confirmed by MRI dec 2011  Past Medical History  Diagnosis Date  . Sleep apnea   . Palpitations   . Dyslipidemia     failed niaspan  . Bradycardia     treated with pacemaker placement- Dr. Sherryl Manges MD  . HTN (hypertension)     Past Surgical History  Procedure Date  . Permanent pacemaker     Current Outpatient Prescriptions  Medication Sig Dispense Refill  . carvedilol (COREG) 6.25 MG tablet Take 6.25 mg by mouth 2 (two) times daily.        . finasteride (PROSCAR) 5 MG tablet Take 5 mg by mouth daily.        . flecainide (TAMBOCOR) 150 MG tablet 1/2 tab po bid  90 tablet  3  . losartan (COZAAR) 50 MG tablet TAKE 1 TABLET EVERY DAY  90 tablet  3  . Tamsulosin HCl (FLOMAX) 0.4 MG CAPS Take 0.4 mg by mouth daily.          Allergies  Allergen Reactions  . Codeine     Review of Systems negative except from HPI and PMH  Physical Exam Well developed and well nourished in no acute distress HENT normal E scleral and icterus clear Neck Supple JVP flat; carotids brisk and full Clear to ausculation Regular rate and rhythm, no murmurs gallops or rub Soft with active bowel sounds No clubbing cyanosis and edema Alert and oriented, grossly normal motor and sensory function Skin Warm and Dry     Assessment and  Plan

## 2010-07-07 NOTE — Assessment & Plan Note (Signed)
Will recheck echo

## 2010-07-07 NOTE — Assessment & Plan Note (Signed)
80% atrially paced

## 2010-07-07 NOTE — Assessment & Plan Note (Signed)
My hope is that this is improved with decreased PVC;  conti nue ARB and bete ablocker therapy

## 2010-07-07 NOTE — Assessment & Plan Note (Signed)
Stable at last MRI

## 2010-07-07 NOTE — Assessment & Plan Note (Signed)
Remain largeley asymptomatic on flecanide;  Will recheck LVEF;  PVC burden is down to 4% or so

## 2010-07-07 NOTE — Patient Instructions (Signed)
Your physician has requested that you have an echocardiogram. Echocardiography is a painless test that uses sound waves to create images of your heart. It provides your doctor with information about the size and shape of your heart and how well your heart's chambers and valves are working. This procedure takes approximately one hour. There are no restrictions for this procedure.  Your physician wants you to follow-up in: 1 year. You will receive a reminder letter in the mail two months in advance. If you don't receive a letter, please call our office to schedule the follow-up appointment.  Your physician recommends that you continue on your current medications as directed. Please refer to the Current Medication list given to you today.  

## 2010-07-12 ENCOUNTER — Ambulatory Visit (HOSPITAL_COMMUNITY): Payer: BC Managed Care – PPO | Attending: Internal Medicine | Admitting: Radiology

## 2010-07-12 DIAGNOSIS — I079 Rheumatic tricuspid valve disease, unspecified: Secondary | ICD-10-CM | POA: Insufficient documentation

## 2010-07-12 DIAGNOSIS — I359 Nonrheumatic aortic valve disorder, unspecified: Secondary | ICD-10-CM

## 2010-07-12 DIAGNOSIS — I712 Thoracic aortic aneurysm, without rupture, unspecified: Secondary | ICD-10-CM

## 2010-07-12 DIAGNOSIS — I495 Sick sinus syndrome: Secondary | ICD-10-CM | POA: Insufficient documentation

## 2010-07-12 DIAGNOSIS — I379 Nonrheumatic pulmonary valve disorder, unspecified: Secondary | ICD-10-CM | POA: Insufficient documentation

## 2010-07-12 DIAGNOSIS — Z950 Presence of cardiac pacemaker: Secondary | ICD-10-CM

## 2010-07-12 DIAGNOSIS — I080 Rheumatic disorders of both mitral and aortic valves: Secondary | ICD-10-CM | POA: Insufficient documentation

## 2010-07-12 DIAGNOSIS — I4949 Other premature depolarization: Secondary | ICD-10-CM

## 2010-07-12 DIAGNOSIS — I2510 Atherosclerotic heart disease of native coronary artery without angina pectoris: Secondary | ICD-10-CM

## 2010-07-12 DIAGNOSIS — I498 Other specified cardiac arrhythmias: Secondary | ICD-10-CM

## 2010-07-21 ENCOUNTER — Telehealth: Payer: Self-pay | Admitting: Internal Medicine

## 2010-07-21 NOTE — Telephone Encounter (Signed)
Pt calling re echo results. °

## 2010-07-21 NOTE — Telephone Encounter (Signed)
Called patient with echo results. Advised will ask Dr.Ross when he should be seen again. Last seen by Dr.Ross on  03/2009

## 2010-08-01 NOTE — Telephone Encounter (Signed)
F/U with me in January.  She should be following up with Brett Canales in June.  Get echo then.

## 2010-08-03 NOTE — Telephone Encounter (Signed)
Called patient to let him know that he will be in call backs for Dr.Ross for January 2013.

## 2010-09-25 LAB — CBC
Hemoglobin: 14.8
RBC: 4.75
WBC: 5.6

## 2010-09-25 LAB — DIFFERENTIAL
Lymphocytes Relative: 24
Monocytes Absolute: 0.3
Monocytes Relative: 5
Neutro Abs: 3.8

## 2010-09-25 LAB — BASIC METABOLIC PANEL
CO2: 25
Calcium: 8.8
GFR calc Af Amer: >60
GFR calc non Af Amer: >60
Sodium: 137

## 2010-09-25 LAB — PROTIME-INR
INR: 1
Prothrombin Time: 13.3

## 2010-10-05 ENCOUNTER — Encounter: Payer: BC Managed Care – PPO | Admitting: *Deleted

## 2010-10-09 ENCOUNTER — Encounter: Payer: Self-pay | Admitting: *Deleted

## 2010-10-11 ENCOUNTER — Other Ambulatory Visit: Payer: Self-pay | Admitting: Internal Medicine

## 2010-10-23 ENCOUNTER — Encounter: Payer: Self-pay | Admitting: Internal Medicine

## 2010-10-23 ENCOUNTER — Ambulatory Visit (INDEPENDENT_AMBULATORY_CARE_PROVIDER_SITE_OTHER): Payer: BC Managed Care – PPO | Admitting: *Deleted

## 2010-10-23 ENCOUNTER — Other Ambulatory Visit: Payer: Self-pay | Admitting: Internal Medicine

## 2010-10-23 DIAGNOSIS — I498 Other specified cardiac arrhythmias: Secondary | ICD-10-CM

## 2010-10-23 DIAGNOSIS — Z950 Presence of cardiac pacemaker: Secondary | ICD-10-CM

## 2010-10-29 LAB — REMOTE PACEMAKER DEVICE
BAMS-0001: 175 {beats}/min
RV LEAD AMPLITUDE: 22.4 mv
RV LEAD IMPEDENCE PM: 484 Ohm
RV LEAD THRESHOLD: 0.5 V

## 2010-11-02 NOTE — Progress Notes (Signed)
Pacer remote check  

## 2010-11-21 ENCOUNTER — Encounter: Payer: Self-pay | Admitting: *Deleted

## 2010-11-21 ENCOUNTER — Telehealth: Payer: Self-pay | Admitting: *Deleted

## 2010-11-21 NOTE — Telephone Encounter (Signed)
LMOM that patient needs a Lipid and Bmet. Please call back to schedule.

## 2010-12-04 ENCOUNTER — Encounter: Payer: Self-pay | Admitting: Gastroenterology

## 2011-01-01 ENCOUNTER — Encounter: Payer: Self-pay | Admitting: Gastroenterology

## 2011-01-01 ENCOUNTER — Ambulatory Visit (AMBULATORY_SURGERY_CENTER): Payer: BC Managed Care – PPO | Admitting: *Deleted

## 2011-01-01 VITALS — Ht 72.0 in | Wt 205.8 lb

## 2011-01-01 DIAGNOSIS — Z1211 Encounter for screening for malignant neoplasm of colon: Secondary | ICD-10-CM

## 2011-01-01 MED ORDER — BISACODYL 5 MG PO TBEC: DELAYED_RELEASE_TABLET | ORAL | Status: DC

## 2011-01-01 MED ORDER — MIRALAX 17 GM/SCOOP PO POWD: ORAL | Status: DC

## 2011-01-01 MED ORDER — METOCLOPRAMIDE HCL 10 MG PO TABS: ORAL_TABLET | ORAL | Status: DC

## 2011-01-01 NOTE — Progress Notes (Signed)
Last colonoscopy 2009. Pt says that he did Miralax prep for that procedure.  He asks to do the same prep for upcoming colonoscopy.  He says that he tolerated Miralax prep ok and does not think he can do anything else.  Procedure reports says prep results were good; pt given instructions for Miralax prep. Patrick Weaver

## 2011-01-03 NOTE — Telephone Encounter (Signed)
Has appointment on 01/11/2011. Will address then.

## 2011-01-11 ENCOUNTER — Ambulatory Visit (INDEPENDENT_AMBULATORY_CARE_PROVIDER_SITE_OTHER): Payer: BC Managed Care – PPO | Admitting: Internal Medicine

## 2011-01-11 ENCOUNTER — Other Ambulatory Visit: Payer: Self-pay | Admitting: *Deleted

## 2011-01-11 ENCOUNTER — Encounter: Payer: Self-pay | Admitting: Internal Medicine

## 2011-01-11 VITALS — BP 124/75 | HR 61 | Ht 72.0 in | Wt 210.0 lb

## 2011-01-11 DIAGNOSIS — I712 Thoracic aortic aneurysm, without rupture, unspecified: Secondary | ICD-10-CM

## 2011-01-11 DIAGNOSIS — E782 Mixed hyperlipidemia: Secondary | ICD-10-CM

## 2011-01-11 DIAGNOSIS — E785 Hyperlipidemia, unspecified: Secondary | ICD-10-CM

## 2011-01-11 DIAGNOSIS — I4949 Other premature depolarization: Secondary | ICD-10-CM

## 2011-01-11 DIAGNOSIS — Z950 Presence of cardiac pacemaker: Secondary | ICD-10-CM

## 2011-01-11 DIAGNOSIS — I359 Nonrheumatic aortic valve disorder, unspecified: Secondary | ICD-10-CM

## 2011-01-11 NOTE — Patient Instructions (Signed)
Your physician wants you to follow-up in:  12 months.  You will receive a reminder letter in the mail two months in advance. If you don't receive a letter, please call our office to schedule the follow-up appointment.   

## 2011-01-11 NOTE — Assessment & Plan Note (Signed)
Better on flecanide.  Continue

## 2011-01-11 NOTE — Assessment & Plan Note (Signed)
Will get labs from Dr/  Julaine Zimny's office.

## 2011-01-11 NOTE — Assessment & Plan Note (Signed)
Stable on echo. 

## 2011-01-11 NOTE — Assessment & Plan Note (Signed)
Will check on when this needs to be rechecked.  Since stable ? If it can be greater than 1 year.

## 2011-01-11 NOTE — Progress Notes (Signed)
HPI Patient is a 5 yea old with a history of mild LV dysfunction, mild to moderate AI, mild dilitation of asc aorta (47 mm in 12/2009), bradycardia (s/p PPM) and PVCs.  He is now on flecanide and doing much better with infrequent PVCs.   Echo in July LVEF was stable at approximately 50%.  Rare dizziness.  Breathing is OK.  Rare skips.  Still working. Not walking like he should    Allergies  Allergen Reactions  . Codeine Nausea Only    Current Outpatient Prescriptions  Medication Sig Dispense Refill  . bisacodyl (DULCOLAX) 5 MG EC tablet Take per prep instructions  4 tablet  0  . carvedilol (COREG) 6.25 MG tablet TAKE 1 TABLET BY MOUTH TWICE A DAY  180 tablet  2  . finasteride (PROSCAR) 5 MG tablet Take 5 mg by mouth daily.        . flecainide (TAMBOCOR) 150 MG tablet 1/2 tab po bid  90 tablet  3  . losartan (COZAAR) 50 MG tablet TAKE 1 TABLET EVERY DAY  90 tablet  3  . metoCLOPramide (REGLAN) 10 MG tablet Take per prep instructions  2 tablet  0  . MIRALAX powder Take per prep instructions  255 g  0  . Tamsulosin HCl (FLOMAX) 0.4 MG CAPS Take 0.4 mg by mouth daily.          Past Medical History  Diagnosis Date  . Sleep apnea   . Palpitations   . Dyslipidemia     failed niaspan  . Bradycardia     treated with pacemaker placement- Dr. Sherryl Manges MD  . HTN (hypertension)     Past Surgical History  Procedure Date  . Permanent pacemaker 2009    Family History  Problem Relation Age of Onset  . Hypertension Mother   . Hypertension Brother   . Heart failure Father     Hx of CHF. died of MI  . Colon cancer Neg Hx   . Stomach cancer Neg Hx     History   Social History  . Marital Status: Married    Spouse Name: N/A    Number of Children: N/A  . Years of Education: N/A   Occupational History  . Not on file.   Social History Main Topics  . Smoking status: Never Smoker   . Smokeless tobacco: Never Used  . Alcohol Use: No  . Drug Use: No  . Sexually Active: Not on  file   Other Topics Concern  . Not on file   Social History Narrative   Married, 3 daughters. Lives in [place], owns his own [employer]. Drinks 3 caffeinated beverages/day.     Review of Systems:  All systems reviewed.  They are negative to the above problem except as previously stated.  Vital Signs: BP 124/75  Pulse 61  Ht 6' (1.829 m)  Wt 210 lb (95.255 kg)  BMI 28.48 kg/m2  Physical Exam Patient is in NAD  HEENT:  Normocephalic, atraumatic. EOMI, PERRLA.  Neck: JVP is normal. No thyromegaly. No bruits.  Lungs: clear to auscultation. No rales no wheezes.  Heart: Regular rate and rhythm. Normal S1, S2. No S3.   No significant murmurs. PMI not displaced.  Abdomen:  Supple, nontender. Normal bowel sounds. No masses. No hepatomegaly.  Extremities:   Good distal pulses throughout. No lower extremity edema.  Musculoskeletal :moving all extremities.  Neuro:   alert and oriented x3.  CN II-XII grossly intact.  EKG:  SR.  61   Atrial paced.   Assessment and Plan:

## 2011-01-11 NOTE — Assessment & Plan Note (Signed)
Followed by S. Klein 

## 2011-01-12 ENCOUNTER — Telehealth: Payer: Self-pay | Admitting: Gastroenterology

## 2011-01-12 NOTE — Telephone Encounter (Signed)
Pt has rescheduled his colon because of the flu

## 2011-01-15 ENCOUNTER — Other Ambulatory Visit: Payer: BC Managed Care – PPO | Admitting: Gastroenterology

## 2011-01-17 ENCOUNTER — Other Ambulatory Visit (INDEPENDENT_AMBULATORY_CARE_PROVIDER_SITE_OTHER): Payer: BC Managed Care – PPO | Admitting: *Deleted

## 2011-01-17 DIAGNOSIS — E782 Mixed hyperlipidemia: Secondary | ICD-10-CM

## 2011-01-17 LAB — LIPID PANEL: Total CHOL/HDL Ratio: 5

## 2011-02-01 ENCOUNTER — Ambulatory Visit (INDEPENDENT_AMBULATORY_CARE_PROVIDER_SITE_OTHER): Payer: BC Managed Care – PPO | Admitting: *Deleted

## 2011-02-01 DIAGNOSIS — Z950 Presence of cardiac pacemaker: Secondary | ICD-10-CM

## 2011-02-01 DIAGNOSIS — I498 Other specified cardiac arrhythmias: Secondary | ICD-10-CM

## 2011-02-04 ENCOUNTER — Encounter: Payer: Self-pay | Admitting: Internal Medicine

## 2011-02-05 ENCOUNTER — Encounter: Payer: Self-pay | Admitting: *Deleted

## 2011-02-11 LAB — REMOTE PACEMAKER DEVICE
ATRIAL PACING PM: 81
BAMS-0001: 175 {beats}/min
RV LEAD AMPLITUDE: 22.4 mv
RV LEAD IMPEDENCE PM: 506 Ohm
RV LEAD THRESHOLD: 0.5 V

## 2011-02-13 ENCOUNTER — Ambulatory Visit (AMBULATORY_SURGERY_CENTER): Payer: BC Managed Care – PPO | Admitting: Gastroenterology

## 2011-02-13 ENCOUNTER — Encounter: Payer: Self-pay | Admitting: Gastroenterology

## 2011-02-13 VITALS — BP 136/71 | HR 69 | Temp 96.4°F | Resp 16 | Ht 72.0 in | Wt 206.0 lb

## 2011-02-13 DIAGNOSIS — Z86010 Personal history of colonic polyps: Secondary | ICD-10-CM

## 2011-02-13 DIAGNOSIS — Z1211 Encounter for screening for malignant neoplasm of colon: Secondary | ICD-10-CM

## 2011-02-13 DIAGNOSIS — D126 Benign neoplasm of colon, unspecified: Secondary | ICD-10-CM

## 2011-02-13 MED ORDER — SODIUM CHLORIDE 0.9 % IV SOLN: 500.0000 mL | INTRAVENOUS | Status: DC

## 2011-02-13 NOTE — Progress Notes (Signed)
Pt. Placed in  Room to wake up a little more 0940.Patient did not experience any of the following events: a burn prior to discharge; a fall within the facility; wrong site/side/patient/procedure/implant event; or a hospital transfer or hospital admission upon discharge from the facility. [phone]) Patient did not have preoperative order for IV antibiotic SSI prophylaxis. [phone])

## 2011-02-13 NOTE — Patient Instructions (Signed)
Resume medications. Information given on polyps. D/C Instructions reviewed with family. 

## 2011-02-13 NOTE — Op Note (Signed)
[facility] [address] [place], [place]  [address]  COLONOSCOPY PROCEDURE REPORT  PATIENT:  Patrick, Weaver  MR#:  [id number] BIRTHDATE:  [birth year], 65 yrs. old  GENDER:  male ENDOSCOPIST:  Rachael Fee, MD PROCEDURE DATE:  02/13/2011 PROCEDURE:  Colonoscopy with biopsy, Colonoscopy with snare polypectomy ASA CLASS:  Class II INDICATIONS:  adenomatous polyps 2009 MEDICATIONS:   Fentanyl 50 mcg IV, These medications were titrated to patient response per physician's verbal order, Versed 4 mg IV  DESCRIPTION OF PROCEDURE:   After the risks benefits and alternatives of the procedure were thoroughly explained, informed consent was obtained.  Digital rectal exam was performed and revealed no rectal masses.   The LB [id number] endoscope was introduced through the anus and advanced to the cecum, which was identified by both the appendix and ileocecal valve, without limitations.  The quality of the prep was good..  The instrument was then slowly withdrawn as the colon was fully examined. <<PROCEDUREIMAGES>> FINDINGS:  Three small sessile polyps were found. These ranged in size from 2mm to 4mm, located in cecum, descending and sigmoid segments. One was removed with forceps and the others with cold snare. All were sent to pathology (jar 1) (see image1 and image3). This was otherwise a normal examination of the colon (see image2 and image4).   Retroflexed views in the rectum revealed no abnormalities. COMPLICATIONS:  None  ENDOSCOPIC IMPRESSION: 1) Three small polyps, all were removed and all were sent to pathology 2) Otherwise normal examination  RECOMMENDATIONS: 1) Given your personal history of adenomatous (pre-cancerous) polyps, you will need a repeat colonoscopy in 5 years even if the polpyps removed today are NOT precancerous.  Will need repeat examination in 3 years if ALL the polyps are precancerou.    2) You will receive a letter within 1-2 weeks with  the results of your biopsy as well as final recommendations. Please call my office if you have not received a letter after 3 weeks. ______________________________ Rachael Fee, MD  cc: Gildardo Cranker, MD  n. eSIGNED:   Rachael Fee at 02/13/2011 09:00 AM  Albertha Ghee, [phone]

## 2011-02-14 ENCOUNTER — Telehealth: Payer: Self-pay | Admitting: *Deleted

## 2011-02-14 NOTE — Telephone Encounter (Signed)
  Follow up Call-  Call back number 02/13/2011  Post procedure Call Back phone  # [phone] cell  Permission to leave phone message Yes     Patient questions:  Do you have a fever, pain , or abdominal swelling? no Pain Score  0 *  Have you tolerated food without any problems? yes  Have you been able to return to your normal activities? yes  Do you have any questions about your discharge instructions: Diet   no Medications  no Follow up visit  no  Do you have questions or concerns about your Care? no  Actions: * If pain score is 4 or above: No action needed, pain <4.

## 2011-02-15 NOTE — Progress Notes (Signed)
Remote pacer check  

## 2011-02-20 ENCOUNTER — Encounter: Payer: Self-pay | Admitting: Gastroenterology

## 2011-02-20 ENCOUNTER — Encounter: Payer: Self-pay | Admitting: *Deleted

## 2011-04-18 ENCOUNTER — Other Ambulatory Visit: Payer: Self-pay | Admitting: Internal Medicine

## 2011-05-10 ENCOUNTER — Encounter: Payer: BC Managed Care – PPO | Admitting: *Deleted

## 2011-05-15 ENCOUNTER — Encounter: Payer: Self-pay | Admitting: Internal Medicine

## 2011-05-15 ENCOUNTER — Ambulatory Visit (INDEPENDENT_AMBULATORY_CARE_PROVIDER_SITE_OTHER): Payer: BC Managed Care – PPO | Admitting: *Deleted

## 2011-05-15 DIAGNOSIS — I495 Sick sinus syndrome: Secondary | ICD-10-CM

## 2011-05-15 DIAGNOSIS — Z950 Presence of cardiac pacemaker: Secondary | ICD-10-CM

## 2011-05-18 ENCOUNTER — Encounter: Payer: Self-pay | Admitting: *Deleted

## 2011-05-21 LAB — REMOTE PACEMAKER DEVICE
AL IMPEDENCE PM: 397 Ohm
AL THRESHOLD: 0.5 V
ATRIAL PACING PM: 80
BATTERY VOLTAGE: 2.79 V
RV LEAD THRESHOLD: 0.5 V

## 2011-06-04 ENCOUNTER — Encounter: Payer: Self-pay | Admitting: *Deleted

## 2011-06-11 ENCOUNTER — Other Ambulatory Visit: Payer: Self-pay

## 2011-06-11 MED ORDER — FLECAINIDE ACETATE 150 MG PO TABS: ORAL_TABLET | ORAL | Status: DC

## 2011-07-04 ENCOUNTER — Encounter: Payer: Self-pay | Admitting: *Deleted

## 2011-07-09 ENCOUNTER — Other Ambulatory Visit: Payer: Self-pay | Admitting: Internal Medicine

## 2011-07-10 ENCOUNTER — Encounter: Payer: Self-pay | Admitting: Internal Medicine

## 2011-07-10 ENCOUNTER — Ambulatory Visit (INDEPENDENT_AMBULATORY_CARE_PROVIDER_SITE_OTHER): Payer: BC Managed Care – PPO | Admitting: Internal Medicine

## 2011-07-10 VITALS — BP 142/80 | HR 64 | Resp 18 | Ht 72.0 in | Wt 218.8 lb

## 2011-07-10 DIAGNOSIS — I712 Thoracic aortic aneurysm, without rupture, unspecified: Secondary | ICD-10-CM

## 2011-07-10 DIAGNOSIS — I498 Other specified cardiac arrhythmias: Secondary | ICD-10-CM

## 2011-07-10 DIAGNOSIS — I10 Essential (primary) hypertension: Secondary | ICD-10-CM

## 2011-07-10 DIAGNOSIS — I2510 Atherosclerotic heart disease of native coronary artery without angina pectoris: Secondary | ICD-10-CM

## 2011-07-10 DIAGNOSIS — R072 Precordial pain: Secondary | ICD-10-CM

## 2011-07-10 DIAGNOSIS — I359 Nonrheumatic aortic valve disorder, unspecified: Secondary | ICD-10-CM

## 2011-07-10 DIAGNOSIS — Z950 Presence of cardiac pacemaker: Secondary | ICD-10-CM

## 2011-07-10 DIAGNOSIS — I4949 Other premature depolarization: Secondary | ICD-10-CM

## 2011-07-10 LAB — PACEMAKER DEVICE OBSERVATION
AL IMPEDENCE PM: 397 Ohm
AL THRESHOLD: 0.5 V
ATRIAL PACING PM: 81
BAMS-0001: 175 {beats}/min
RV LEAD AMPLITUDE: 15.67 mv
RV LEAD THRESHOLD: 0.5 V

## 2011-07-10 NOTE — Assessment & Plan Note (Signed)
It has been almost 2 years. Because of concordance between echo and CT we will start with an echo. This will minimize radiation

## 2011-07-10 NOTE — Assessment & Plan Note (Signed)
Adequate covered

## 2011-07-10 NOTE — Assessment & Plan Note (Signed)
Chest pain-as noted above with a Myoview scanning

## 2011-07-10 NOTE — Assessment & Plan Note (Addendum)
Relatively well controlled on flecainide. Device counts demonstrates a bout 2.5% a day\ With his chest discomfort, we'll need to undertake Myoview scanning. With his prior normal catheterization I would have a high threshold for proceeding with repeat invasive evaluation

## 2011-07-10 NOTE — Assessment & Plan Note (Signed)
Last EF was 50%

## 2011-07-10 NOTE — Assessment & Plan Note (Signed)
We will reassess by echo (see below)

## 2011-07-10 NOTE — Patient Instructions (Signed)
Your physician has requested that you have an echocardiogram. Echocardiography is a painless test that uses sound waves to create images of your heart. It provides your doctor with information about the size and shape of your heart and how well your heart's chambers and valves are working. This procedure takes approximately one hour. There are no restrictions for this procedure.  Your physician has requested that you have en exercise stress myoview. For further information please visit [email or web address] Please follow instruction sheet, as given.  Remote monitoring is used to monitor your Pacemaker of ICD from home. This monitoring reduces the number of office visits required to check your device to one time per year. It allows [place] to keep an eye on the functioning of your device to ensure it is working properly. You are scheduled for a device check from home on 10/15/11. You may send your transmission at any time that day. If you have a wireless device, the transmission will be sent automatically. After your physician reviews your transmission, you will receive a postcard with your next transmission date.  Your physician wants you to follow-up in: 1 year with Dr. Graciela Husbands. You will receive a reminder letter in the mail two months in advance. If you don't receive a letter, please call our office to schedule the follow-up appointment.

## 2011-07-10 NOTE — Assessment & Plan Note (Signed)
The patient's device was interrogated.  The information was reviewed. No changes were made in the programming.    

## 2011-07-10 NOTE — Progress Notes (Signed)
.  sklf  HPI  Patrick Weaver is a 66 y.o. male seen in followup for pacer implantation for bradycardia. He has also had significant problems with pvcs. This has been treated with flecanide with much improvement.   He denies shortness of breath. He has occasional palpitations. He describes exertional chest tightness. He has had a catheterization demonstrating no effect of coronary disease.     Myoview scan 2010 demonstrated ejection fraction of 44% with global hypokinesis.  Aortic regurgitation Mild to moderate regurgitation directed centrally  in the LVOT. Regurgitation pressure half-time: .  Aorta: Ascending aortic aneurysm. Aortic root dimension: 41mm  (ED). Ascending aortic diameter: 47mm This was confirmed by MRI dec 2011 but has not been rechecked since  I sahould note that   the MRI   and the echo concordant   Past Medical History  Diagnosis Date  . Sleep apnea   . Palpitations   . Dyslipidemia     failed niaspan  . Bradycardia     treated with pacemaker placement- Dr. Sherryl Manges MD  . HTN (hypertension)     Past Surgical History  Procedure Date  . Permanent pacemaker 2009  . Toisillectomy     Current Outpatient Prescriptions  Medication Sig Dispense Refill  . carvedilol (COREG) 6.25 MG tablet TAKE 1 TABLET BY MOUTH TWICE A DAY  180 tablet  2  . finasteride (PROSCAR) 5 MG tablet Take 5 mg by mouth daily.        . flecainide (TAMBOCOR) 150 MG tablet 1/2 tab po bid  90 tablet  3  . losartan (COZAAR) 50 MG tablet TAKE 1 TABLET EVERY DAY  90 tablet  3  . meloxicam (MOBIC) 7.5 MG tablet       . Tamsulosin HCl (FLOMAX) 0.4 MG CAPS Take 0.4 mg by mouth daily.          Allergies  Allergen Reactions  . Codeine Nausea Only    Review of Systems negative except from HPI and PMH  Physical Exam BP 142/80  Pulse 64  Resp 18  Ht 6' (1.829 m)  Wt 218 lb 12.8 oz (99.247 kg)  BMI 29.67 kg/m2  SpO2 98% Well developed and well nourished in no acute distress HENT  normal E scleral and icterus clear Neck Supple JVP flat; carotids brisk and full Clear to ausculation Regular rate and rhythm,2/6 diastolic  Soft with active bowel sounds No clubbing cyanosis none Edema Alert and oriented, grossly normal motor and sensory function Skin Warm and Dry    Assessment and  Plan

## 2011-07-18 ENCOUNTER — Ambulatory Visit (HOSPITAL_COMMUNITY): Payer: BC Managed Care – PPO | Attending: Cardiology

## 2011-07-18 DIAGNOSIS — I498 Other specified cardiac arrhythmias: Secondary | ICD-10-CM | POA: Insufficient documentation

## 2011-07-18 DIAGNOSIS — I359 Nonrheumatic aortic valve disorder, unspecified: Secondary | ICD-10-CM

## 2011-07-18 DIAGNOSIS — I517 Cardiomegaly: Secondary | ICD-10-CM | POA: Insufficient documentation

## 2011-07-18 DIAGNOSIS — E785 Hyperlipidemia, unspecified: Secondary | ICD-10-CM | POA: Insufficient documentation

## 2011-07-18 DIAGNOSIS — I10 Essential (primary) hypertension: Secondary | ICD-10-CM | POA: Insufficient documentation

## 2011-07-18 DIAGNOSIS — R079 Chest pain, unspecified: Secondary | ICD-10-CM | POA: Insufficient documentation

## 2011-07-18 DIAGNOSIS — I712 Thoracic aortic aneurysm, without rupture, unspecified: Secondary | ICD-10-CM | POA: Insufficient documentation

## 2011-07-18 DIAGNOSIS — I4949 Other premature depolarization: Secondary | ICD-10-CM | POA: Insufficient documentation

## 2011-07-18 NOTE — Progress Notes (Signed)
Echocardiogram performed.  

## 2011-07-23 ENCOUNTER — Ambulatory Visit (HOSPITAL_COMMUNITY): Payer: BC Managed Care – PPO | Attending: Cardiology | Admitting: Radiology

## 2011-07-23 VITALS — BP 140/89 | HR 60 | Ht 72.0 in | Wt 206.0 lb

## 2011-07-23 DIAGNOSIS — R072 Precordial pain: Secondary | ICD-10-CM

## 2011-07-23 DIAGNOSIS — R0609 Other forms of dyspnea: Secondary | ICD-10-CM | POA: Insufficient documentation

## 2011-07-23 DIAGNOSIS — I739 Peripheral vascular disease, unspecified: Secondary | ICD-10-CM | POA: Insufficient documentation

## 2011-07-23 DIAGNOSIS — R002 Palpitations: Secondary | ICD-10-CM | POA: Insufficient documentation

## 2011-07-23 DIAGNOSIS — R9439 Abnormal result of other cardiovascular function study: Secondary | ICD-10-CM | POA: Insufficient documentation

## 2011-07-23 DIAGNOSIS — R079 Chest pain, unspecified: Secondary | ICD-10-CM

## 2011-07-23 DIAGNOSIS — Z87891 Personal history of nicotine dependence: Secondary | ICD-10-CM | POA: Insufficient documentation

## 2011-07-23 DIAGNOSIS — R0789 Other chest pain: Secondary | ICD-10-CM | POA: Insufficient documentation

## 2011-07-23 DIAGNOSIS — R0989 Other specified symptoms and signs involving the circulatory and respiratory systems: Secondary | ICD-10-CM | POA: Insufficient documentation

## 2011-07-23 DIAGNOSIS — I10 Essential (primary) hypertension: Secondary | ICD-10-CM | POA: Insufficient documentation

## 2011-07-23 DIAGNOSIS — I714 Abdominal aortic aneurysm, without rupture, unspecified: Secondary | ICD-10-CM | POA: Insufficient documentation

## 2011-07-23 DIAGNOSIS — E785 Hyperlipidemia, unspecified: Secondary | ICD-10-CM | POA: Insufficient documentation

## 2011-07-23 DIAGNOSIS — Z8249 Family history of ischemic heart disease and other diseases of the circulatory system: Secondary | ICD-10-CM | POA: Insufficient documentation

## 2011-07-23 DIAGNOSIS — R0602 Shortness of breath: Secondary | ICD-10-CM

## 2011-07-23 MED ORDER — TECHNETIUM TC 99M TETROFOSMIN IV KIT
10.0000 | PACK | Freq: Once | INTRAVENOUS | Status: AC | PRN
  Administered 2011-07-23: 10 via INTRAVENOUS

## 2011-07-23 MED ORDER — REGADENOSON 0.4 MG/5ML IV SOLN
0.4000 mg | Freq: Once | INTRAVENOUS | Status: AC
  Administered 2011-07-23: 0.4 mg via INTRAVENOUS

## 2011-07-23 MED ORDER — TECHNETIUM TC 99M TETROFOSMIN IV KIT
30.0000 | PACK | Freq: Once | INTRAVENOUS | Status: AC | PRN
  Administered 2011-07-23: 30 via INTRAVENOUS

## 2011-07-23 NOTE — Progress Notes (Signed)
[facility] SITE 3 NUCLEAR MED [address] [place] [address] [phone]  Cardiology Nuclear Med Study  Patrick Weaver is a 66 y.o. male     MRN : [id number]     DOB: 1945-12-10  Procedure Date: 07/23/2011  Nuclear Med Background Indication for Stress Test:  Evaluation for Ischemia History:  '05 Cath:Normal coronaries; '09 PTVP; '10 MPS:? 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 IV 0.9% NS with Angio Cath:  20g  IV Site: R Antecubital x 1, tolerated well IV Started by:  Irean Hong, RN  Chest Size (in):  42 Cup Size: n/a  Height: 6' (1.829 m)  Weight:  206 lb (93.441 kg)  BMI:  Body mass index is 27.94 kg/(m^2). Tech Comments:  Held coreg this am    Nuclear Med Study 1 or 2 day study: 1 day  Stress Test Type:  Lexiscan  Reading MD: Olga Millers, MD  Order Authorizing Provider:  Sherryl Manges, MD, and Dietrich Pates, MD  Resting Radionuclide: Technetium 78m Tetrofosmin  Resting Radionuclide Dose: 11.0 mCi   Stress Radionuclide:  Technetium 38m Tetrofosmin  Stress Radionuclide Dose: 33.0 mCi           Stress Protocol Rest HR: 60 Stress HR: 67  Rest BP: 140/89 Stress BP: 143/87  Exercise Time (min): n/a METS: n/a   Predicted Max HR: 157 bpm % Max HR: 42.68 bpm Rate Pressure Product: 9581   Dose of Adenosine (mg):  n/a Dose of Lexiscan: 0.4 mg  Dose of Atropine (mg): n/a Dose of Dobutamine: n/a mcg/kg/min (at max HR)  Stress Test Technologist: Smiley Houseman, CMA-N  Nuclear Technologist:  Domenic Polite, CNMT     Rest Procedure:  Myocardial perfusion imaging was performed at rest 45  minutes following the intravenous administration of Technetium 39m Tetrofosmin.  Rest ECG: Atrial paced with nonspecific T-wave changes.  Stress Procedure:  The patient received IV Lexiscan 0.4 mg over 15-seconds.  Technetium 6m Tetrofosmin injected at 30-seconds.  There were no significant changes with Lexiscan.  He did c/o chest tightness, 5/10, with Lexiscan.  Quantitative spect images were obtained after a 45 minute delay.  Stress ECG: No significant ST segment change suggestive of ischemia.  QPS Raw Data Images:  Acquisition technically good; evidence of LVE. Stress Images:  There is decreased uptake in the inferior wall. Rest Images:  There is decreased uptake in the inferior wall. Subtraction (SDS):  There is a fixed defect that is most consistent with a previous infarction. Transient Ischemic Dilatation (Normal <1.22):  1.04 Lung/Heart Ratio (Normal <0.45):  0.32  Quantitative Gated Spect Images QGS EDV:  175 ml QGS ESV:  102 ml  Impression Exercise Capacity:  Lexiscan with no exercise. BP Response:  Normal blood pressure response. Clinical Symptoms:  There is chest tightness. ECG Impression:  No significant ST segment change suggestive of ischemia. Comparison with Prior Nuclear Study: No significant change from previous study  Overall Impression:  Abnormal stress nuclear study with a moderate size, medium intensity, fixed inferior defect consistent with prior infarct; no ischemia.  LV Ejection Fraction: 41%.  LV Wall Motion:  Global hypokinesis.  Olga Millers

## 2011-07-26 ENCOUNTER — Telehealth: Payer: Self-pay | Admitting: *Deleted

## 2011-07-26 NOTE — Telephone Encounter (Signed)
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

## 2011-07-26 NOTE — Telephone Encounter (Signed)
Results reviewed, prior echo EF reviewed, pt will call back Monday 07/30/11 to speak with Dr Odessa Fleming nurse/ Herbert Seta RN by 2 pm to see if app can be made or a phone consult with Dr Graciela Husbands can be done to discuss further testing/ cath. Pt aware dr/nurse out of office this week. Please call pt back 07/30/11

## 2011-07-30 ENCOUNTER — Telehealth: Payer: Self-pay | Admitting: Internal Medicine

## 2011-07-30 NOTE — Telephone Encounter (Signed)
Pt's wife calling re setting up another procedure, was told he would be called today, and if not heard by two was to call here , pls call [phone]

## 2011-07-30 NOTE — Telephone Encounter (Signed)
Previous phone encounter open for the same issue. Will close this encounter.

## 2011-07-30 NOTE — Telephone Encounter (Signed)
Message sent to Dr. Graciela Husbands to find out if the patient just needs to be set up for cath, or do I need to bring him in to see someone to discuss in Dr. Odessa Fleming abscence over the next 2 weeks, or does Dr. Graciela Husbands want to call or see the patient to discuss when he returns.

## 2011-07-30 NOTE — Telephone Encounter (Signed)
Call Documentation     Patrick Weaver 07/30/2011 3:58 PM Signed  Pt's wife calling re setting up another procedure, was told he would be called today, and if not heard by two was to call here , pls call [phone]

## 2011-07-30 NOTE — Telephone Encounter (Signed)
Message received from Dr. Graciela Husbands stating it would be fine to have the patient come in to see someone, but if he is stable, he would like to wait until he is back to speak with the patient. I have called and notified the patient and his wife of Dr. Odessa Fleming recommendations. Per the patient, he has some occasional chest tightness. He would like to wait and see how he does and talk with Dr. Graciela Husbands when he is back. He has been instructed to call me should his symptoms worsen and I will get him in for a work up prior to cath. The patient verbalizes understanding.

## 2011-08-03 ENCOUNTER — Telehealth: Payer: Self-pay | Admitting: Internal Medicine

## 2011-08-03 DIAGNOSIS — I2510 Atherosclerotic heart disease of native coronary artery without angina pectoris: Secondary | ICD-10-CM

## 2011-08-03 DIAGNOSIS — R079 Chest pain, unspecified: Secondary | ICD-10-CM

## 2011-08-03 NOTE — Telephone Encounter (Signed)
Pt rtn your call from yesterday 

## 2011-08-03 NOTE — Telephone Encounter (Signed)
Set up for JV cath for 8/9 730 for 830. See note attached to nuc study.

## 2011-08-06 NOTE — Telephone Encounter (Signed)
Per review of the patients results note, this message was left on his myoview report by Carlyon Shadow, RN:  Called patient with results of stress myoview. He is complaining of chest pain with exertion relieved with rest. Set up for JV cath 8/9 730 for 830 with Dr.Hochrein. Patient aware NPO after midnight. Dr.Ross aware. Will let Sherri Rad RN and Dr.Klein know.

## 2011-08-08 ENCOUNTER — Other Ambulatory Visit (INDEPENDENT_AMBULATORY_CARE_PROVIDER_SITE_OTHER): Payer: BC Managed Care – PPO

## 2011-08-08 DIAGNOSIS — I2510 Atherosclerotic heart disease of native coronary artery without angina pectoris: Secondary | ICD-10-CM

## 2011-08-08 DIAGNOSIS — R079 Chest pain, unspecified: Secondary | ICD-10-CM

## 2011-08-08 LAB — CBC WITH DIFFERENTIAL/PLATELET
Basophils Absolute: 0 10*3/uL (ref 0.0–0.1)
Hemoglobin: 14 g/dL (ref 13.0–17.0)
Lymphocytes Relative: 23 % (ref 12.0–46.0)
Monocytes Relative: 7.9 % (ref 3.0–12.0)
Platelets: 137 10*3/uL — ABNORMAL LOW (ref 150.0–400.0)
RDW: 13.2 % (ref 11.5–14.6)

## 2011-08-08 LAB — BASIC METABOLIC PANEL
BUN: 13 mg/dL (ref 6–23)
Calcium: 9 mg/dL (ref 8.4–10.5)
GFR: 76.51 mL/min (ref >60.00)
Glucose, Bld: 85 mg/dL (ref 70–99)
Sodium: 139 mEq/L (ref 135–145)

## 2011-08-09 ENCOUNTER — Telehealth: Payer: Self-pay | Admitting: Cardiology

## 2011-08-09 NOTE — Telephone Encounter (Signed)
Per Dr Antoine Poche OK to have cath.

## 2011-08-09 NOTE — Progress Notes (Signed)
Patient seen recently by S. Klein.  Complained of CP  Myoview ordered.  Showed inferior fixed defect.  No ichemia  LVEF 41%.  This was unchanged from previous myoview (had cath after that showed on signif CAD) When contacted patient with result he was stil complaineing of exertional CP.  Rec L heart cath to define anatomy. 

## 2011-08-09 NOTE — Telephone Encounter (Signed)
New msg Pt's wife called and said he has abscess tooth and he is scheduled for cath tomorrow. He went to dentist and he gave him some amoxicillin. Please call back as what to do about cath

## 2011-08-09 NOTE — Telephone Encounter (Signed)
Wife aware OK to proceed with cath.  Reviewed time for pt to be at the JV lab.  Wife states understanding

## 2011-08-10 ENCOUNTER — Encounter (HOSPITAL_BASED_OUTPATIENT_CLINIC_OR_DEPARTMENT_OTHER)
Admission: RE | Disposition: A | Discharge status: Home / Self Care | Payer: Self-pay | Source: Ambulatory Visit | Attending: Cardiology

## 2011-08-10 ENCOUNTER — Inpatient Hospital Stay (HOSPITAL_BASED_OUTPATIENT_CLINIC_OR_DEPARTMENT_OTHER)
Admission: RE | Admit: 2011-08-10 | Discharge: 2011-08-10 | Disposition: A | Discharge status: Home / Self Care | Payer: BC Managed Care – PPO | Source: Ambulatory Visit | Attending: Cardiology | Admitting: Cardiology

## 2011-08-10 DIAGNOSIS — R079 Chest pain, unspecified: Secondary | ICD-10-CM | POA: Insufficient documentation

## 2011-08-10 DIAGNOSIS — I359 Nonrheumatic aortic valve disorder, unspecified: Secondary | ICD-10-CM

## 2011-08-10 DIAGNOSIS — R9430 Abnormal result of cardiovascular function study, unspecified: Secondary | ICD-10-CM

## 2011-08-10 DIAGNOSIS — R9439 Abnormal result of other cardiovascular function study: Secondary | ICD-10-CM | POA: Insufficient documentation

## 2011-08-10 SURGERY — JV LEFT HEART CATHETERIZATION WITH CORONARY ANGIOGRAM
Anesthesia: Moderate Sedation

## 2011-08-10 MED ORDER — SODIUM CHLORIDE 0.9 % IV SOLN: 250.0000 mL | INTRAVENOUS | Status: DC | PRN

## 2011-08-10 MED ORDER — ONDANSETRON HCL 4 MG/2ML IJ SOLN: 4.0000 mg | Freq: Four times a day (QID) | INTRAMUSCULAR | Status: DC | PRN

## 2011-08-10 MED ORDER — LABETALOL HCL 5 MG/ML IV SOLN
10.0000 mg | Freq: Once | INTRAVENOUS | Status: AC
  Administered 2011-08-10: 10 mg via INTRAVENOUS

## 2011-08-10 MED ORDER — SODIUM CHLORIDE 0.9 % IJ SOLN: 3.0000 mL | INTRAMUSCULAR | Status: DC | PRN

## 2011-08-10 MED ORDER — SODIUM CHLORIDE 0.9 % IV SOLN: INTRAVENOUS | Status: DC

## 2011-08-10 MED ORDER — ASPIRIN 81 MG PO CHEW
324.0000 mg | CHEWABLE_TABLET | ORAL | Status: AC
  Administered 2011-08-10: 324 mg via ORAL

## 2011-08-10 MED ORDER — ACETAMINOPHEN 325 MG PO TABS: 650.0000 mg | ORAL_TABLET | ORAL | Status: DC | PRN

## 2011-08-10 MED ORDER — SODIUM CHLORIDE 0.9 % IJ SOLN: 3.0000 mL | Freq: Two times a day (BID) | INTRAMUSCULAR | Status: DC

## 2011-08-10 NOTE — OR Nursing (Signed)
Dr Madelin Rear at bedside to discuss results and treatment plan with pt and family

## 2011-08-10 NOTE — OR Nursing (Signed)
Tegaderm dressing applied, site level 0, bedrest 0905

## 2011-08-10 NOTE — CV Procedure (Signed)
  Cardiac Catheterization Procedure Note  Name: Patrick Weaver MRN: [id number] DOB: 06-16-1945  Procedure: Left Heart Cath, Selective Coronary Angiography, LV angiography  Indication:  Abnormal stress perfusion study with EF 41% with moderate size inferior fixed defect.  Ongoing chest pain at rest.  Procedural details: The right groin was prepped, draped, and anesthetized with 1% lidocaine. Using modified Seldinger technique, a 4 French sheath was introduced into the right femoral artery. Standard Judkins catheters were used for coronary angiography and left ventriculography. Catheter exchanges were performed over a guidewire. There were no immediate procedural complications. The patient was transferred to the post catheterization recovery area for further monitoring.  Procedural Findings:   Hemodynamics:     AO 151/82    LV 159/14   Coronary angiography:   Coronary dominance: Right  Left mainstem:   Normal  Left anterior descending (LAD):   Mild luminal irregularities.  Small diagonals x 2 normal  Left circumflex (LCx):  AV groove normal.  OM1 large and normal.  OM2 large and normal.  Right coronary artery (RCA):  Mild luminal irregularities.  25% stenosis after the AM.  PDA moderate sized and normal.    Left ventriculography: Left ventricular systolic function is normal, LVEF is estimated at 65%, there is no significant mitral regurgitation.  Aortic root was done secondary to known Ao root dilatation and AI.  Moderate regurgitation as reported on previous cath and echocardiography.  Final Conclusions:  Mild coronary plaque.  Normal LV function.  Moderate AI  Recommendations: Continued medical management and follow up of aortic insufficiency.    Rollene Rotunda 08/10/2011, 9:04 AM

## 2011-08-10 NOTE — H&P (View-Only) (Signed)
Patient seen recently by Odessa Fleming.  Complained of CP  Myoview ordered.  Showed inferior fixed defect.  No ichemia  LVEF 41%.  This was unchanged from previous myoview (had cath after that showed on signif CAD) When contacted patient with result he was stil complaineing of exertional CP.  Rec L heart cath to define anatomy.

## 2011-08-10 NOTE — OR Nursing (Signed)
Meal served 

## 2011-08-10 NOTE — Interval H&P Note (Signed)
History and Physical Interval Note:  08/10/2011 8:31 AM  Patrick Weaver  has presented today for surgery, with the diagnosis of + Nuclear  The various methods of treatment have been discussed with the patient and family. After consideration of risks, benefits and other options for treatment, the patient has consented to  Procedure(s) (LRB): JV LEFT HEART CATHETERIZATION WITH CORONARY ANGIOGRAM (N/A) as a surgical intervention .  The patient's history has been reviewed, patient examined, no change in status, stable for surgery.  I have reviewed the patient's chart and labs.  Questions were answered to the patient's satisfaction.  Abnormal stress nuclear study with a moderate size, medium intensity, fixed inferior defect consistent with prior infarct; no ischemia. LV Ejection Fraction: 41%. LV Wall Motion: Global hypokinesis.  The patient understands that risks included but are not limited to stroke (1 in 1000), death (1 in 1000), kidney failure [usually temporary] (1 in 500), bleeding (1 in 200), allergic reaction [possibly serious] (1 in 200).  The patient understands and agrees to proceed.   Rollene Rotunda

## 2011-08-10 NOTE — OR Nursing (Signed)
Discharge instructions reviewed and signed, pt stated understanding, ambulated in hall without difficulty, site level 0, transported to wife's car via wheelchair 

## 2011-08-24 ENCOUNTER — Ambulatory Visit (INDEPENDENT_AMBULATORY_CARE_PROVIDER_SITE_OTHER): Payer: BC Managed Care – PPO | Admitting: Internal Medicine

## 2011-08-24 ENCOUNTER — Encounter: Payer: Self-pay | Admitting: Internal Medicine

## 2011-08-24 VITALS — BP 132/82 | HR 61 | Ht 72.0 in | Wt 207.0 lb

## 2011-08-24 DIAGNOSIS — I498 Other specified cardiac arrhythmias: Secondary | ICD-10-CM

## 2011-08-24 DIAGNOSIS — Z950 Presence of cardiac pacemaker: Secondary | ICD-10-CM

## 2011-08-24 DIAGNOSIS — R001 Bradycardia, unspecified: Secondary | ICD-10-CM

## 2011-08-24 NOTE — Progress Notes (Addendum)
HPI Patient is a 66 year old with a history of  Complained of CP  Seen by Odessa Fleming  Myoview showed LVEF 41% with moderate inferior defect fixed(ad in past)  With continued pain on exertion he went on to have cath.  Cath showed luminal irreg of LAD, 25% acute marginal of RCA.  LVEF 65%  Moderate AI. Echo in July aortic root 43  Mild aortic regurg.  Since cath has done OK  He thinks he may have pushed himself too hard with long work hours.  Has not had any discomfort since (though has not pushed himself quite to that degree) Breathing is OK  Allergies  Allergen Reactions  . Codeine Nausea Only    Current Outpatient Prescriptions  Medication Sig Dispense Refill  . carvedilol (COREG) 6.25 MG tablet TAKE 1 TABLET BY MOUTH TWICE A DAY  180 tablet  2  . finasteride (PROSCAR) 5 MG tablet Take 5 mg by mouth daily.        . flecainide (TAMBOCOR) 150 MG tablet 1/2 tab po bid  90 tablet  3  . losartan (COZAAR) 50 MG tablet TAKE 1 TABLET EVERY DAY  90 tablet  3  . meloxicam (MOBIC) 7.5 MG tablet daily.       . Tamsulosin HCl (FLOMAX) 0.4 MG CAPS Take 0.4 mg by mouth daily.          Past Medical History  Diagnosis Date  . Sleep apnea   . Palpitations   . Dyslipidemia     failed niaspan  . Bradycardia     treated with pacemaker placement- Dr. Sherryl Manges MD  . HTN (hypertension)     Past Surgical History  Procedure Date  . Permanent pacemaker 2009  . Toisillectomy     Family History  Problem Relation Age of Onset  . Hypertension Mother   . Hypertension Brother   . Heart failure Father     Hx of CHF. died of MI  . Stomach cancer Neg Hx   . Esophageal cancer Neg Hx   . Colon cancer Maternal Grandfather     History   Social History  . Marital Status: Married    Spouse Name: N/A    Number of Children: N/A  . Years of Education: N/A   Occupational History  . Not on file.   Social History Main Topics  . Smoking status: Never Smoker   . Smokeless tobacco: Never Used  .  Alcohol Use: No  . Drug Use: No  . Sexually Active: Not on file   Other Topics Concern  . Not on file   Social History Narrative   Married, 3 daughters. Lives in 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 Drinks 3 caffeinated beverages/day.     Review of Systems:  All systems reviewed.  They are negative to the above problem except as previously stated.  Vital Signs: BP 132/82  Pulse 61  Ht 6' (1.829 m)  Wt 207 lb (93.895 kg)  BMI 28.07 kg/m2  Physical Exam Patient is in NAD HEENT:  Normocephalic, atraumatic. EOMI, PERRLA.  Neck: JVP is normal.  No bruits.  Lungs: clear to auscultation. No rales no wheezes.  Heart: Regular rate and rhythm. Normal S1, S2. No S3.   No significant murmurs. PMI not displaced.  Abdomen:  Supple, nontender. Normal bowel sounds. No masses. No hepatomegaly.  Extremities:   Good distal pulses throughout. No lower extremity edema.  Musculoskeletal :moving all extremities.  Neuro:   alert and oriented  x3.  CN II-XII grossly intact.  Atrial paced 61 bpm. Assessment and Plan:  1.  CP>  Patient has not pushed himself as he did before.  No signif dz of major arteries. QUestion if symptoms represent diastolic dysfunction in setting of AI I would follow.  I would not change medicines for now as I may make more symptomatic with fatigue, dizziness.  2.  Aortic insuff  Will continue to follow  3.  Bradycardia  S/p PPM  Followed by Odessa Fleming  4.  Plapitations.  Continue flecanide  5.  HL  Will need to follow    6.  HTN  Good control.

## 2011-09-24 ENCOUNTER — Telehealth: Payer: Self-pay | Admitting: Internal Medicine

## 2011-09-24 NOTE — Telephone Encounter (Signed)
I spoke with Patrick Weaver, Pharm D to verify if there are any restrictions for cardiac patients to receive the shingles vaccine. Per Patrick Weaver, no restrictions. I have spoken with the patient and made him aware that he may receive this vaccine from a cardiac standpoint. He verbalizes understanding.

## 2011-09-24 NOTE — Telephone Encounter (Signed)
plz return call to patient on mobile # [phone] regarding shingles vaccination.  He would like to ask questions about getting the vac. As his wife currently has shingles.

## 2011-10-15 ENCOUNTER — Encounter: Payer: BC Managed Care – PPO | Admitting: *Deleted

## 2011-10-18 ENCOUNTER — Encounter: Payer: Self-pay | Admitting: *Deleted

## 2011-10-30 ENCOUNTER — Ambulatory Visit (INDEPENDENT_AMBULATORY_CARE_PROVIDER_SITE_OTHER): Payer: BC Managed Care – PPO | Admitting: *Deleted

## 2011-10-30 DIAGNOSIS — I498 Other specified cardiac arrhythmias: Secondary | ICD-10-CM

## 2011-10-30 DIAGNOSIS — Z950 Presence of cardiac pacemaker: Secondary | ICD-10-CM

## 2011-10-31 LAB — REMOTE PACEMAKER DEVICE
AL THRESHOLD: 0.5 V
BAMS-0001: 175 {beats}/min
RV LEAD AMPLITUDE: 22.4 mv
RV LEAD THRESHOLD: 0.5 V

## 2011-11-27 ENCOUNTER — Encounter: Payer: Self-pay | Admitting: *Deleted

## 2011-12-03 ENCOUNTER — Encounter: Payer: Self-pay | Admitting: Internal Medicine

## 2012-02-04 ENCOUNTER — Ambulatory Visit (INDEPENDENT_AMBULATORY_CARE_PROVIDER_SITE_OTHER): Payer: BC Managed Care – PPO | Admitting: *Deleted

## 2012-02-04 DIAGNOSIS — Z950 Presence of cardiac pacemaker: Secondary | ICD-10-CM

## 2012-02-04 DIAGNOSIS — I498 Other specified cardiac arrhythmias: Secondary | ICD-10-CM

## 2012-02-05 ENCOUNTER — Other Ambulatory Visit: Payer: Self-pay

## 2012-02-08 ENCOUNTER — Encounter: Payer: Self-pay | Admitting: Internal Medicine

## 2012-02-08 ENCOUNTER — Ambulatory Visit (INDEPENDENT_AMBULATORY_CARE_PROVIDER_SITE_OTHER): Payer: BC Managed Care – PPO | Admitting: Internal Medicine

## 2012-02-08 VITALS — BP 157/82 | HR 63 | Ht 72.0 in | Wt 211.0 lb

## 2012-02-08 DIAGNOSIS — E785 Hyperlipidemia, unspecified: Secondary | ICD-10-CM

## 2012-02-08 DIAGNOSIS — I10 Essential (primary) hypertension: Secondary | ICD-10-CM

## 2012-02-08 LAB — BASIC METABOLIC PANEL
BUN: 16 mg/dL (ref 6–23)
Chloride: 108 mEq/L (ref 96–112)
Creatinine, Ser: 1.3 mg/dL (ref 0.4–1.5)
GFR: 60.66 mL/min (ref >60.00)
Glucose, Bld: 91 mg/dL (ref 70–99)

## 2012-02-08 LAB — CBC WITH DIFFERENTIAL/PLATELET
Basophils Relative: 0.5 % (ref 0.0–3.0)
Eosinophils Absolute: 0.3 10*3/uL (ref 0.0–0.7)
HCT: 44.9 % (ref 39.0–52.0)
MCHC: 34.7 g/dL (ref 30.0–36.0)
Monocytes Absolute: 0.4 10*3/uL (ref 0.1–1.0)
Neutro Abs: 3.7 10*3/uL (ref 1.4–7.7)
WBC: 5.7 10*3/uL (ref 4.5–10.5)

## 2012-02-08 LAB — LIPID PANEL
Cholesterol: 176 mg/dL (ref 0–200)
LDL Cholesterol: 115 mg/dL — ABNORMAL HIGH (ref 0–99)

## 2012-02-08 MED ORDER — AMLODIPINE BESYLATE 5 MG PO TABS: 5.0000 mg | ORAL_TABLET | Freq: Every day | ORAL | Status: DC

## 2012-02-08 NOTE — Progress Notes (Addendum)
HPI Patient is a 67 year old with a history of palpitations, aortic insufficiency. He was last seen in clinic in August.  Earlier last year he had complained of CP Seen by Odessa Fleming Myoview showed LVEF 41% with moderate inferior defect fixed(ad in past) With continued pain on exertion he went on to have cath. Cath showed luminal irreg of LAD, 25% acute marginal of RCA. LVEF 65% Moderate AI.  Echo in July aortic root 43 Mild aortic regurg.  Since seen he says when he pushes himself with brisker walking he can get SOB and chest tightness  Relieved with rest.  Has not changed since last summer. Denies symptoms at rest.   Allergies  Allergen Reactions  . Codeine Nausea Only    Current Outpatient Prescriptions  Medication Sig Dispense Refill  . carvedilol (COREG) 6.25 MG tablet TAKE 1 TABLET BY MOUTH TWICE A DAY  180 tablet  2  . finasteride (PROSCAR) 5 MG tablet Take 5 mg by mouth daily.        . flecainide (TAMBOCOR) 150 MG tablet 1/2 tab po bid  90 tablet  3  . losartan (COZAAR) 50 MG tablet TAKE 1 TABLET EVERY DAY  90 tablet  3  . meloxicam (MOBIC) 7.5 MG tablet daily.       . Tamsulosin HCl (FLOMAX) 0.4 MG CAPS Take 0.4 mg by mouth daily.          Past Medical History  Diagnosis Date  . Sleep apnea   . Palpitations   . Dyslipidemia     failed niaspan  . Bradycardia     treated with pacemaker placement- Dr. Sherryl Manges MD  . HTN (hypertension)     Past Surgical History  Procedure Date  . Permanent pacemaker 2009  . Toisillectomy     Family History  Problem Relation Age of Onset  . Hypertension Mother   . Hypertension Brother   . Heart failure Father     Hx of CHF. died of MI  . Stomach cancer Neg Hx   . Esophageal cancer Neg Hx   . Colon cancer Maternal Grandfather     History   Social History  . Marital Status: Married    Spouse Name: N/A    Number of Children: N/A  . Years of Education: N/A   Occupational History  . Not on file.   Social History Main  Topics  . Smoking status: Never Smoker   . Smokeless tobacco: Never Used  . Alcohol Use: No  . Drug Use: No  . Sexually Active: Not on file   Other Topics Concern  . Not on file   Social History Narrative   Married, 3 daughters. 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 Drinks 3 caffeinated beverages/day.     Review of Systems:  All systems reviewed.  They are negative to the above problem except as previously stated.  Vital Signs: BP 157/82  Pulse 63  Ht 6' (1.829 m)  Wt 211 lb (95.709 kg)  BMI 28.62 kg/m2  Physical Exam Patient is in NAD HEENT:  Normocephalic, atraumatic. EOMI, PERRLA.  Neck: JVP is normal.  No bruits.  Lungs: clear to auscultation. No rales no wheezes.  Heart: Regular rate and rhythm. Normal S1, S2. No S3.   No signif diastolic murmurs. PMI not displaced.  Abdomen:  Supple, nontender. Normal bowel sounds. No masses. No hepatomegaly.  Extremities:   Good distal pulses throughout. No lower extremity edema.  Musculoskeletal :moving all extremities.  Neuro:   alert and oriented x3.  CN II-XII grossly intact.  EKG  Atrial paced 63 bpm.  T wave inversion inferolaterally.  Assessment and Plan:  1.  Chest tightness.  Patient with mild CAD  I am not convinced symptoms represent coronary ischemia.   Question if relatd to AI though it is not severe.   I would recomm a trial of norvasc instead of Cozaar  He will call in a couple weeks with response  2.  HTN  He did not take meds this AM  WIll make switch as noted and follow  3.  CAD  Mild by cath  4.  Aortic dilitation.  WIll need periodic F/U  5.  AI  Does not appear severe.   6  HCM  Will check fasting lipids.

## 2012-02-08 NOTE — Patient Instructions (Addendum)
LABS TODAY:  CBC, BMET, Lipid Panel  Your physician wants you to follow-up in: 9 months with Dr. Ladona Ridgel.  You will receive a reminder letter in the mail two months in advance. If you don't receive a letter, please call our office to schedule the follow-up appointment.   Stop Cozaar for two weeks and try Amlodipine 5mg  daily.  Report back to [place] after a 2 week trial on your chest tightness.  If no better, resume Cozaar.  [phone]-Alaycia Eardley

## 2012-02-14 LAB — REMOTE PACEMAKER DEVICE
AL THRESHOLD: 0.5 V
ATRIAL PACING PM: 84
BAMS-0001: 175 {beats}/min
RV LEAD IMPEDENCE PM: 471 Ohm
RV LEAD THRESHOLD: 0.5 V

## 2012-02-18 ENCOUNTER — Telehealth: Payer: Self-pay | Admitting: Internal Medicine

## 2012-02-18 ENCOUNTER — Telehealth: Payer: Self-pay | Admitting: *Deleted

## 2012-02-18 DIAGNOSIS — E785 Hyperlipidemia, unspecified: Secondary | ICD-10-CM

## 2012-02-18 MED ORDER — ATORVASTATIN CALCIUM 10 MG PO TABS: 10.0000 mg | ORAL_TABLET | Freq: Every day | ORAL | Status: DC

## 2012-02-18 NOTE — Telephone Encounter (Signed)
New Problem:    Patient's wife called in returning your call.  Please call the patient's husband back on his cell phone number.

## 2012-02-18 NOTE — Telephone Encounter (Signed)
Done

## 2012-02-18 NOTE — Telephone Encounter (Signed)
CBC, BMET are nornal Lipids show elevation of LDL On cath he had mild plaquing of vessels. LDL was in 110s when it had been in 80s in past WOuld recomm 10 mg lipitor F/U lipids in 8 wks Watch saturated fats in diet.  The above message from Dr. Tenny Craw given to pt.  Pt agreed and Lipitor sent into [facility].

## 2012-02-26 ENCOUNTER — Encounter: Payer: Self-pay | Admitting: *Deleted

## 2012-03-03 ENCOUNTER — Encounter: Payer: Self-pay | Admitting: Internal Medicine

## 2012-03-12 ENCOUNTER — Telehealth: Payer: Self-pay | Admitting: Internal Medicine

## 2012-03-12 ENCOUNTER — Other Ambulatory Visit: Payer: Self-pay | Admitting: *Deleted

## 2012-03-12 MED ORDER — AMLODIPINE BESYLATE 5 MG PO TABS: 5.0000 mg | ORAL_TABLET | Freq: Every day | ORAL | Status: DC

## 2012-03-12 NOTE — Telephone Encounter (Signed)
pt was given 2 week sample of med, he doesn't know the name and not at home to look it up, doig well on it and needs a rx for it at [facility], only need to call if any problems or questions @ [phone]

## 2012-03-12 NOTE — Telephone Encounter (Signed)
Rx sent in for amlodipine 5mg 

## 2012-04-08 ENCOUNTER — Other Ambulatory Visit: Payer: Self-pay | Admitting: Internal Medicine

## 2012-04-20 ENCOUNTER — Other Ambulatory Visit: Payer: Self-pay | Admitting: Internal Medicine

## 2012-04-22 ENCOUNTER — Other Ambulatory Visit: Payer: Self-pay

## 2012-05-12 ENCOUNTER — Encounter: Payer: BC Managed Care – PPO | Admitting: *Deleted

## 2012-05-23 ENCOUNTER — Encounter: Payer: Self-pay | Admitting: *Deleted

## 2012-05-26 ENCOUNTER — Encounter: Payer: Self-pay | Admitting: Internal Medicine

## 2012-05-27 ENCOUNTER — Ambulatory Visit (INDEPENDENT_AMBULATORY_CARE_PROVIDER_SITE_OTHER): Payer: BC Managed Care – PPO | Admitting: *Deleted

## 2012-05-27 DIAGNOSIS — Z950 Presence of cardiac pacemaker: Secondary | ICD-10-CM

## 2012-05-27 DIAGNOSIS — I498 Other specified cardiac arrhythmias: Secondary | ICD-10-CM

## 2012-06-01 LAB — REMOTE PACEMAKER DEVICE
AL IMPEDENCE PM: 408 Ohm
ATRIAL PACING PM: 85
BATTERY VOLTAGE: 2.79 V
RV LEAD IMPEDENCE PM: 493 Ohm
RV LEAD THRESHOLD: 0.5 V
VENTRICULAR PACING PM: 1

## 2012-06-03 ENCOUNTER — Telehealth: Payer: Self-pay | Admitting: Internal Medicine

## 2012-06-05 NOTE — Telephone Encounter (Signed)
Follow-up:    Called in wanting to know the status of the patient's refill request for their flecainide (TAMBOCOR) 150 MG tablet.  Please call back.

## 2012-07-08 ENCOUNTER — Encounter: Payer: Self-pay | Admitting: *Deleted

## 2012-07-09 ENCOUNTER — Encounter: Payer: Self-pay | Admitting: Internal Medicine

## 2012-07-09 ENCOUNTER — Ambulatory Visit (INDEPENDENT_AMBULATORY_CARE_PROVIDER_SITE_OTHER): Payer: BC Managed Care – PPO | Admitting: Internal Medicine

## 2012-07-09 VITALS — BP 113/52 | HR 71 | Ht 72.0 in | Wt 207.0 lb

## 2012-07-09 DIAGNOSIS — Z950 Presence of cardiac pacemaker: Secondary | ICD-10-CM

## 2012-07-09 DIAGNOSIS — I498 Other specified cardiac arrhythmias: Secondary | ICD-10-CM

## 2012-07-09 DIAGNOSIS — I712 Thoracic aortic aneurysm, without rupture, unspecified: Secondary | ICD-10-CM

## 2012-07-09 DIAGNOSIS — I7121 Aneurysm of the ascending aorta, without rupture: Secondary | ICD-10-CM

## 2012-07-09 LAB — PACEMAKER DEVICE OBSERVATION
AL THRESHOLD: 0.5 V
ATRIAL PACING PM: 85
BAMS-0001: 175 {beats}/min
RV LEAD THRESHOLD: 0.5 V
VENTRICULAR PACING PM: 1

## 2012-07-09 NOTE — Assessment & Plan Note (Addendum)
The patient's device was interrogated.  The information was reviewed.  Rate response was made more aggressive

## 2012-07-09 NOTE — Assessment & Plan Note (Addendum)
Relatively well controlled (0.5%) on flecainide which he is tolerating fluids QRS duration has moved from 240 ms-2013---30o msec 2014

## 2012-07-09 NOTE — Progress Notes (Signed)
Patient Care Team: Gildardo Cranker, MD as PCP - General (Family Medicine)   HPI  Patrick Weaver is a 67 y.o. male seen in followup for pacer implantation for bradycardia. He has also had significant problems with pvcs. This has been treated with flecanide with much improvement.  He denies shortness of breath. He has occasional palpitations. He describes exertional chest tightness. Left Heart catheterization  Aug 2014 demonstrated no significant coronary disease. EF 65%  .  Echo 2013 Aortic regurgitation Mild to moderate regurgitation directed centrally  in the LVOT. Regurgitation pressure half-time: .  Aorta: Ascending aortic aneurysm. Aortic root dimension: 43mm   Ascending aortic diameter: 45 mm by CT 2011 but has not been rechecked by CT since  The patient denies chest pain, shortness of breath, nocturnal dyspnea, orthopnea or peripheral edema.  There have been no palpitations, lightheadedness or syncope. He is feeling much better since Dr. Margretta Ditty adjust his medications willing to see the record what medication this was   he denies exercise intolerance chest pains or edema   Past Medical History  Diagnosis Date  . Sleep apnea   . Palpitations   . Dyslipidemia     failed niaspan  . Bradycardia     treated with pacemaker placement- Dr. Sherryl Manges MD  . HTN (hypertension)     Past Surgical History  Procedure Laterality Date  . Permanent pacemaker  2009  . Toisillectomy      Current Outpatient Prescriptions  Medication Sig Dispense Refill  . amLODipine (NORVASC) 5 MG tablet Take 1 tablet (5 mg total) by mouth daily.  90 tablet  3  . atorvastatin (LIPITOR) 10 MG tablet Take 1 tablet (10 mg total) by mouth daily.  90 tablet  3  . carvedilol (COREG) 6.25 MG tablet TAKE 1 TABLET BY MOUTH TWICE A DAY  180 tablet  3  . finasteride (PROSCAR) 5 MG tablet Take 5 mg by mouth daily.        . flecainide (TAMBOCOR) 150 MG tablet TAKE 1/2 TABLET 2 TIMES A DAY  90 tablet  0  . meloxicam  (MOBIC) 7.5 MG tablet daily.       . Tamsulosin HCl (FLOMAX) 0.4 MG CAPS Take 0.4 mg by mouth daily.         No current facility-administered medications for this visit.    Allergies  Allergen Reactions  . Codeine Nausea Only    Review of Systems negative except from HPI and PMH  Physical Exam BP 113/52  Pulse 71  Ht 6' (1.829 m)  Wt 207 lb (93.895 kg)  BMI 28.07 kg/m2 Well developed and nourished in no acute distress HENT normal Neck supple with JVP-flat Clear Device pocket well healed; without hematoma or erythema Regular rate and rhythm, no murmurs or gallops Abd-soft with active BS No Clubbing cyanosis edema Skin-warm and dry A & Oriented  Grossly normal sensory and motor function   ECG demonstrates atrial pacing at 71 Intervals30/12/42 Nonspecific ST-T changes Left axis deviation  Assessment and  Plan

## 2012-07-09 NOTE — Assessment & Plan Note (Addendum)
100% atrially paced. We'll increase his rate response. Uppersensor rate is already 160.

## 2012-07-09 NOTE — Patient Instructions (Signed)
Non-Cardiac CT Angiography (CTA), is a special type of CT scan that uses a computer to produce multi-dimensional views of major blood vessels throughout the body. In CT angiography, a contrast material is injected through an IV to help visualize the blood vessels. To evaluate your ascending aortic aneurysm  Your physician recommends that you continue on your current medications as directed. Please refer to the Current Medication list given to you today.   You  Will need to send a carelink transmission on 10/13/12  Your physician wants you to follow-up in:  1 year with Dr. Graciela Husbands.  You will receive a reminder letter in the mail two months in advance. If you don't receive a letter, please call our office to schedule the follow-up appointment.

## 2012-07-09 NOTE — Assessment & Plan Note (Signed)
The echo from a year ago been 5% smaller than the CT scan from 2 years before that is likely erroneous. We will repeat a CT scan. He is not a candidate for MR secondary to his pacemaker

## 2012-07-15 ENCOUNTER — Ambulatory Visit (INDEPENDENT_AMBULATORY_CARE_PROVIDER_SITE_OTHER)
Admission: RE | Admit: 2012-07-15 | Discharge: 2012-07-15 | Disposition: A | Discharge status: Home / Self Care | Payer: BC Managed Care – PPO | Source: Ambulatory Visit | Attending: Internal Medicine | Admitting: Internal Medicine

## 2012-07-15 ENCOUNTER — Encounter: Payer: BC Managed Care – PPO | Admitting: Cardiology

## 2012-07-15 DIAGNOSIS — I712 Thoracic aortic aneurysm, without rupture, unspecified: Secondary | ICD-10-CM

## 2012-07-15 DIAGNOSIS — I7121 Aneurysm of the ascending aorta, without rupture: Secondary | ICD-10-CM

## 2012-07-15 MED ORDER — IOHEXOL 350 MG/ML SOLN
100.0000 mL | Freq: Once | INTRAVENOUS | Status: AC | PRN
  Administered 2012-07-15: 100 mL via INTRAVENOUS

## 2012-07-17 ENCOUNTER — Encounter: Payer: Self-pay | Admitting: Internal Medicine

## 2012-07-17 ENCOUNTER — Ambulatory Visit (INDEPENDENT_AMBULATORY_CARE_PROVIDER_SITE_OTHER): Payer: BC Managed Care – PPO | Admitting: *Deleted

## 2012-07-17 DIAGNOSIS — Z950 Presence of cardiac pacemaker: Secondary | ICD-10-CM

## 2012-07-17 LAB — PACEMAKER DEVICE OBSERVATION
AL IMPEDENCE PM: 413 Ohm
BAMS-0001: 175 {beats}/min
RV LEAD IMPEDENCE PM: 493 Ohm

## 2012-07-17 NOTE — Progress Notes (Signed)
Pt in today to undo rate response changes from 07/09/12 visit--- pt c/o feeling "all messed up now from those previous changes." Changed ADL response from 3 to 2, changed activity threshold from low to medium/low. Carelink 10-13-12 and ROV in 12 mths w/SK. Estella Husk

## 2012-08-04 ENCOUNTER — Other Ambulatory Visit: Payer: Self-pay | Admitting: Internal Medicine

## 2012-08-28 ENCOUNTER — Encounter: Payer: Self-pay | Admitting: *Deleted

## 2012-09-17 ENCOUNTER — Other Ambulatory Visit: Payer: Self-pay | Admitting: Internal Medicine

## 2012-10-13 ENCOUNTER — Ambulatory Visit (INDEPENDENT_AMBULATORY_CARE_PROVIDER_SITE_OTHER): Payer: BC Managed Care – PPO | Admitting: *Deleted

## 2012-10-13 DIAGNOSIS — Z950 Presence of cardiac pacemaker: Secondary | ICD-10-CM

## 2012-10-13 DIAGNOSIS — I495 Sick sinus syndrome: Secondary | ICD-10-CM

## 2012-10-14 LAB — REMOTE PACEMAKER DEVICE
AL IMPEDENCE PM: 419 Ohm
ATRIAL PACING PM: 90
BATTERY VOLTAGE: 2.79 V
VENTRICULAR PACING PM: 2

## 2012-10-22 ENCOUNTER — Encounter: Payer: Self-pay | Admitting: *Deleted

## 2012-11-07 ENCOUNTER — Encounter: Payer: Self-pay | Admitting: Internal Medicine

## 2013-01-14 ENCOUNTER — Ambulatory Visit (INDEPENDENT_AMBULATORY_CARE_PROVIDER_SITE_OTHER): Payer: BC Managed Care – PPO | Admitting: *Deleted

## 2013-01-14 DIAGNOSIS — I495 Sick sinus syndrome: Secondary | ICD-10-CM

## 2013-01-14 DIAGNOSIS — Z950 Presence of cardiac pacemaker: Secondary | ICD-10-CM

## 2013-01-14 LAB — MDC_IDC_ENUM_SESS_TYPE_REMOTE
Battery Impedance: 308 Ohm
Brady Statistic AP VS Percent: 87 %
Brady Statistic AS VS Percent: 10 %
Date Time Interrogation Session: 20150119082029
Lead Channel Impedance Value: 547 Ohm
Lead Channel Pacing Threshold Amplitude: 0.5 V
Lead Channel Pacing Threshold Pulse Width: 0.4 ms
Lead Channel Pacing Threshold Pulse Width: 0.4 ms
MDC IDC MSMT BATTERY REMAINING LONGEVITY: 104 mo
MDC IDC MSMT BATTERY VOLTAGE: 2.79 V
MDC IDC MSMT LEADCHNL RA IMPEDANCE VALUE: 425 Ohm
MDC IDC MSMT LEADCHNL RA PACING THRESHOLD AMPLITUDE: 0.375 V
MDC IDC MSMT LEADCHNL RV SENSING INTR AMPL: 11.2 mV
MDC IDC SET LEADCHNL RA PACING AMPLITUDE: 2 V
MDC IDC SET LEADCHNL RV PACING AMPLITUDE: 2.5 V
MDC IDC SET LEADCHNL RV PACING PULSEWIDTH: 0.4 ms
MDC IDC SET LEADCHNL RV SENSING SENSITIVITY: 5.6 mV
MDC IDC STAT BRADY AP VP PERCENT: 2 %
MDC IDC STAT BRADY AS VP PERCENT: 0 %

## 2013-01-19 ENCOUNTER — Encounter: Payer: Self-pay | Admitting: *Deleted

## 2013-02-04 ENCOUNTER — Encounter: Payer: Self-pay | Admitting: *Deleted

## 2013-02-12 ENCOUNTER — Encounter: Payer: Self-pay | Admitting: Internal Medicine

## 2013-02-15 ENCOUNTER — Other Ambulatory Visit: Payer: Self-pay | Admitting: Internal Medicine

## 2013-03-04 ENCOUNTER — Other Ambulatory Visit: Payer: Self-pay | Admitting: Internal Medicine

## 2013-03-23 DIAGNOSIS — J069 Acute upper respiratory infection, unspecified: Secondary | ICD-10-CM | POA: Diagnosis not present

## 2013-04-01 ENCOUNTER — Other Ambulatory Visit: Payer: Self-pay | Admitting: Dermatology

## 2013-04-01 DIAGNOSIS — D1801 Hemangioma of skin and subcutaneous tissue: Secondary | ICD-10-CM | POA: Diagnosis not present

## 2013-04-01 DIAGNOSIS — L820 Inflamed seborrheic keratosis: Secondary | ICD-10-CM | POA: Diagnosis not present

## 2013-04-01 DIAGNOSIS — Z85828 Personal history of other malignant neoplasm of skin: Secondary | ICD-10-CM | POA: Diagnosis not present

## 2013-04-01 DIAGNOSIS — D485 Neoplasm of uncertain behavior of skin: Secondary | ICD-10-CM | POA: Diagnosis not present

## 2013-04-01 DIAGNOSIS — D1739 Benign lipomatous neoplasm of skin and subcutaneous tissue of other sites: Secondary | ICD-10-CM | POA: Diagnosis not present

## 2013-04-01 DIAGNOSIS — D2330 Other benign neoplasm of skin of unspecified part of face: Secondary | ICD-10-CM | POA: Diagnosis not present

## 2013-04-01 DIAGNOSIS — D239 Other benign neoplasm of skin, unspecified: Secondary | ICD-10-CM | POA: Diagnosis not present

## 2013-04-13 DIAGNOSIS — H2510 Age-related nuclear cataract, unspecified eye: Secondary | ICD-10-CM | POA: Diagnosis not present

## 2013-04-13 DIAGNOSIS — H52229 Regular astigmatism, unspecified eye: Secondary | ICD-10-CM | POA: Diagnosis not present

## 2013-04-13 DIAGNOSIS — H524 Presbyopia: Secondary | ICD-10-CM | POA: Diagnosis not present

## 2013-04-13 DIAGNOSIS — H5200 Hypermetropia, unspecified eye: Secondary | ICD-10-CM | POA: Diagnosis not present

## 2013-04-16 ENCOUNTER — Encounter: Payer: BC Managed Care – PPO | Admitting: *Deleted

## 2013-04-29 ENCOUNTER — Encounter: Payer: Self-pay | Admitting: *Deleted

## 2013-05-08 ENCOUNTER — Ambulatory Visit (INDEPENDENT_AMBULATORY_CARE_PROVIDER_SITE_OTHER): Payer: BC Managed Care – PPO | Admitting: *Deleted

## 2013-05-08 DIAGNOSIS — I495 Sick sinus syndrome: Secondary | ICD-10-CM

## 2013-05-08 DIAGNOSIS — Z950 Presence of cardiac pacemaker: Secondary | ICD-10-CM

## 2013-05-09 ENCOUNTER — Encounter: Payer: Self-pay | Admitting: Internal Medicine

## 2013-05-11 DIAGNOSIS — C4330 Malignant melanoma of unspecified part of face: Secondary | ICD-10-CM | POA: Diagnosis not present

## 2013-05-11 DIAGNOSIS — D485 Neoplasm of uncertain behavior of skin: Secondary | ICD-10-CM | POA: Diagnosis not present

## 2013-05-12 DIAGNOSIS — D485 Neoplasm of uncertain behavior of skin: Secondary | ICD-10-CM | POA: Diagnosis not present

## 2013-05-12 DIAGNOSIS — C4330 Malignant melanoma of unspecified part of face: Secondary | ICD-10-CM | POA: Diagnosis not present

## 2013-05-17 ENCOUNTER — Other Ambulatory Visit: Payer: Self-pay | Admitting: Internal Medicine

## 2013-05-22 ENCOUNTER — Encounter: Payer: Self-pay | Admitting: Internal Medicine

## 2013-05-31 ENCOUNTER — Other Ambulatory Visit: Payer: Self-pay | Admitting: Internal Medicine

## 2013-06-09 LAB — MDC_IDC_ENUM_SESS_TYPE_REMOTE
Battery Impedance: 382 Ohm
Battery Remaining Longevity: 95 mo
Battery Voltage: 2.79 V
Brady Statistic AP VP Percent: 2 %
Brady Statistic AP VS Percent: 88 %
Brady Statistic AS VP Percent: 1 %
Brady Statistic AS VS Percent: 9 %
Date Time Interrogation Session: 20150509201554
Lead Channel Impedance Value: 381 Ohm
Lead Channel Impedance Value: 527 Ohm
Lead Channel Pacing Threshold Amplitude: 0.5 V
Lead Channel Pacing Threshold Amplitude: 0.625 V
Lead Channel Pacing Threshold Pulse Width: 0.4 ms
Lead Channel Pacing Threshold Pulse Width: 0.4 ms
Lead Channel Setting Pacing Amplitude: 2.5 V
Lead Channel Setting Pacing Pulse Width: 0.4 ms
MDC IDC MSMT LEADCHNL RV SENSING INTR AMPL: 11.2 mV
MDC IDC SET LEADCHNL RA PACING AMPLITUDE: 2 V
MDC IDC SET LEADCHNL RV SENSING SENSITIVITY: 5.6 mV

## 2013-06-18 ENCOUNTER — Encounter: Payer: Self-pay | Admitting: Internal Medicine

## 2013-07-01 ENCOUNTER — Encounter: Payer: Self-pay | Admitting: Cardiology

## 2013-07-31 ENCOUNTER — Other Ambulatory Visit: Payer: Self-pay

## 2013-07-31 ENCOUNTER — Other Ambulatory Visit: Payer: Self-pay | Admitting: Internal Medicine

## 2013-08-17 ENCOUNTER — Encounter: Payer: BC Managed Care – PPO | Admitting: Internal Medicine

## 2013-08-18 ENCOUNTER — Encounter: Payer: Self-pay | Admitting: Internal Medicine

## 2013-08-18 ENCOUNTER — Ambulatory Visit (INDEPENDENT_AMBULATORY_CARE_PROVIDER_SITE_OTHER): Payer: BC Managed Care – PPO | Admitting: Internal Medicine

## 2013-08-18 VITALS — BP 135/77 | HR 72 | Ht 73.0 in | Wt 211.0 lb

## 2013-08-18 DIAGNOSIS — I498 Other specified cardiac arrhythmias: Secondary | ICD-10-CM | POA: Diagnosis not present

## 2013-08-18 DIAGNOSIS — Z950 Presence of cardiac pacemaker: Secondary | ICD-10-CM

## 2013-08-18 DIAGNOSIS — R001 Bradycardia, unspecified: Secondary | ICD-10-CM

## 2013-08-18 DIAGNOSIS — I495 Sick sinus syndrome: Secondary | ICD-10-CM

## 2013-08-18 LAB — MDC_IDC_ENUM_SESS_TYPE_INCLINIC
Battery Impedance: 433 Ohm
Battery Remaining Longevity: 91 mo
Battery Voltage: 2.79 V
Brady Statistic AP VP Percent: 3 %
Brady Statistic AP VS Percent: 88 %
Brady Statistic AS VS Percent: 9 %
Lead Channel Impedance Value: 397 Ohm
Lead Channel Pacing Threshold Amplitude: 0.5 V
Lead Channel Pacing Threshold Amplitude: 0.5 V
Lead Channel Pacing Threshold Pulse Width: 0.4 ms
Lead Channel Pacing Threshold Pulse Width: 0.4 ms
Lead Channel Setting Pacing Amplitude: 2 V
Lead Channel Setting Pacing Amplitude: 2.5 V
Lead Channel Setting Pacing Pulse Width: 0.4 ms
Lead Channel Setting Sensing Sensitivity: 5.6 mV
MDC IDC MSMT LEADCHNL RA SENSING INTR AMPL: 1.4 mV
MDC IDC MSMT LEADCHNL RV IMPEDANCE VALUE: 460 Ohm
MDC IDC MSMT LEADCHNL RV SENSING INTR AMPL: 11.2 mV
MDC IDC SESS DTM: 20150818141225
MDC IDC STAT BRADY AS VP PERCENT: 1 %

## 2013-08-18 MED ORDER — CARVEDILOL 12.5 MG PO TABS: 12.5000 mg | ORAL_TABLET | Freq: Two times a day (BID) | ORAL | Status: DC

## 2013-08-18 NOTE — Patient Instructions (Addendum)
Your physician has recommended you make the following change in your medication:  1) INCREASE Carvedilol to 12.5 mg twice daily  Your physician has requested that you have an echocardiogram. Echocardiography is a painless test that uses sound waves to create images of your heart. It provides your doctor with information about the size and shape of your heart and how well your heart's chambers and valves are working. This procedure takes approximately one hour. There are no restrictions for this procedure.  Your physician recommends that you schedule a follow-up appointment in: 1-2 months with Dr. [name].   Remote monitoring is used to monitor your Pacemaker of ICD from home. This monitoring reduces the number of office visits required to check your device to one time per year. It allows [place] to keep an eye on the functioning of your device to ensure it is working properly. You are scheduled for a device check from home on 11/19/13. You may send your transmission at any time that day. If you have a wireless device, the transmission will be sent automatically. After your physician reviews your transmission, you will receive a postcard with your next transmission date.  Your physician wants you to follow-up in: 1 year with Dr. Caryl Comes.  You will receive a reminder letter in the mail two months in advance. If you don't receive a letter, please call our office to schedule the follow-up appointment.

## 2013-08-18 NOTE — Progress Notes (Signed)
Patient Care Team: Melinda Crutch, MD as PCP - General (Family Medicine)   HPI  Patrick Weaver is a 68 y.o. male seen in followup for pacer implantation for bradycardia. He has also had significant problems with pvcs. This has been treated with flecanide with much improvement.  He denies shortness of breath. He has occasional palpitations. He describes exertional chest tightness. Left Heart catheterization Aug 2014 demonstrated no significant coronary disease. EF 65%  .  Echo 2013 Aortic regurgitation Mild to moderate regurgitation directed centrally  in the LVOT. Regurgitation pressure half-time: 521ms.  Aorta: Ascending aortic aneurysm. Aortic root dimension: 63mm  Ascending aortic diameter: 45 mm by CT 2011    Repeat CT 7/14 showed stable aortic diameter 45 mm  The patient denies chest pain, shortness of breath, nocturnal dyspnea, orthopnea or peripheral edema. There have been no palpitations, lightheadedness or syncope.    Past Medical History  Diagnosis Date  . Sleep apnea   . Palpitations   . Dyslipidemia     failed niaspan  . Bradycardia     treated with pacemaker placement- Dr. Virl Axe MD  . HTN (hypertension)   . PVCs (premature ventricular contractions)   . Pacemaker reprogramming/check   . PACEMAKER-Medtronic 08/31/2008    Qualifier: Diagnosis of  By: Caryl Comes, MD, [facility], Mack Guise     Past Surgical History  Procedure Laterality Date  . Permanent pacemaker  2009  . Toisillectomy      Current Outpatient Prescriptions  Medication Sig Dispense Refill  . amLODipine (NORVASC) 5 MG tablet TAKE 1 TABLET (5 MG TOTAL) BY MOUTH DAILY.  90 tablet  0  . atorvastatin (LIPITOR) 10 MG tablet TAKE 1 TABLET (10 MG TOTAL) BY MOUTH DAILY.  90 tablet  0  . carvedilol (COREG) 6.25 MG tablet TAKE 1 TABLET BY MOUTH TWICE A DAY  180 tablet  1  . finasteride (PROSCAR) 5 MG tablet Take 5 mg by mouth daily.        . flecainide (TAMBOCOR) 150 MG tablet TAKE 1/2 TABLET 2 TIMES A  DAY  90 tablet  3  . meloxicam (MOBIC) 7.5 MG tablet daily.       . Tamsulosin HCl (FLOMAX) 0.4 MG CAPS Take 0.4 mg by mouth daily.         No current facility-administered medications for this visit.    Allergies  Allergen Reactions  . Codeine Nausea Only    Review of Systems negative except from HPI and PMH  Physical Exam BP 135/77  Pulse 72  Ht 6\' 1"  (1.854 m)  Wt 211 lb (95.709 kg)  BMI 27.84 kg/m2 Well developed and well nourished in no acute distress HENT normal E scleral and icterus clear Neck Supple JVP flat; carotids brisk and full Clear to ausculation  Device pocket well healed; without hematoma or erythema.  There is no tethering   Regular rate and rhythm, no murmurs gallops or rub Soft with active bowel sounds No clubbing cyanosis  Edema Alert and oriented, grossly normal motor and sensory function Skin Warm and Dry  Ecg  Atrial paced rhythm  Assessment and  Plan  Sinus node dysfunction  Pacemaker-Medtronic The patient's device was interrogated.  The information was reviewed. No changes were made in the programming.     PVCs-flecainide  HTN    Aortic root dilatation with central AI  He is largely asymptomatic. He needs repeat assessment of his aortic root. We'll undertake that by echo. We will arrange  follow up thereafter with Dr. [name].  Continue him on his beta blockers. We will uptitrate his carvedilol given his blood pressure elevation with the targets being defined at less than 115  For now we'll leave him on amlodipine as initiated by Dr. [name] which she started because of chest pain.

## 2013-08-20 ENCOUNTER — Other Ambulatory Visit: Payer: Self-pay | Admitting: Internal Medicine

## 2013-08-20 ENCOUNTER — Encounter: Payer: Self-pay | Admitting: Internal Medicine

## 2013-08-25 ENCOUNTER — Ambulatory Visit (HOSPITAL_COMMUNITY): Payer: BC Managed Care – PPO | Attending: Internal Medicine

## 2013-08-25 DIAGNOSIS — I495 Sick sinus syndrome: Secondary | ICD-10-CM | POA: Diagnosis not present

## 2013-08-25 DIAGNOSIS — I359 Nonrheumatic aortic valve disorder, unspecified: Secondary | ICD-10-CM | POA: Diagnosis not present

## 2013-08-25 DIAGNOSIS — I498 Other specified cardiac arrhythmias: Secondary | ICD-10-CM | POA: Diagnosis not present

## 2013-08-25 DIAGNOSIS — R001 Bradycardia, unspecified: Secondary | ICD-10-CM

## 2013-08-25 NOTE — Progress Notes (Signed)
2D Echo completed. 08/25/2013

## 2013-09-01 ENCOUNTER — Other Ambulatory Visit: Payer: Self-pay | Admitting: Internal Medicine

## 2013-09-17 ENCOUNTER — Other Ambulatory Visit: Payer: Self-pay | Admitting: Internal Medicine

## 2013-09-28 ENCOUNTER — Telehealth: Payer: Self-pay | Admitting: Internal Medicine

## 2013-09-28 DIAGNOSIS — R0789 Other chest pain: Secondary | ICD-10-CM

## 2013-09-28 NOTE — Telephone Encounter (Signed)
New problem:    Per pt needs an appt per his discussion last with Dr Caryl Comes. To speed his device up?? Please give pt a call with an appointment.

## 2013-09-28 NOTE — Telephone Encounter (Signed)
Patient complains of chest tightness with walking or strenuous activity. This was discussed at last OV with Dr. Caryl Comes.  They discussed possible stress testing, but pt not interested at the time. Pt would like to pursue testing now to see what is going on. Informed patient that I would review with Dr. Caryl Comes and call him back with recommendations. Pt agreeable to plan, and states no rush on this.

## 2013-10-01 DIAGNOSIS — D1800 Hemangioma unspecified site: Secondary | ICD-10-CM | POA: Diagnosis not present

## 2013-10-01 DIAGNOSIS — L821 Other seborrheic keratosis: Secondary | ICD-10-CM | POA: Diagnosis not present

## 2013-10-01 DIAGNOSIS — D229 Melanocytic nevi, unspecified: Secondary | ICD-10-CM | POA: Diagnosis not present

## 2013-10-01 DIAGNOSIS — D2330 Other benign neoplasm of skin of unspecified part of face: Secondary | ICD-10-CM | POA: Diagnosis not present

## 2013-10-01 NOTE — Telephone Encounter (Signed)
Reviewed with Dr. Caryl Comes, informed patient recommended stress testing. Pt agreeable to plan and understands someone will call him to arrange this testing.

## 2013-10-02 ENCOUNTER — Other Ambulatory Visit: Payer: Self-pay | Admitting: *Deleted

## 2013-10-02 DIAGNOSIS — N401 Enlarged prostate with lower urinary tract symptoms: Secondary | ICD-10-CM | POA: Diagnosis not present

## 2013-10-02 DIAGNOSIS — R351 Nocturia: Secondary | ICD-10-CM | POA: Diagnosis not present

## 2013-10-02 DIAGNOSIS — R972 Elevated prostate specific antigen [PSA]: Secondary | ICD-10-CM | POA: Diagnosis not present

## 2013-10-02 DIAGNOSIS — R3915 Urgency of urination: Secondary | ICD-10-CM | POA: Diagnosis not present

## 2013-10-02 MED ORDER — CARVEDILOL 12.5 MG PO TABS: 12.5000 mg | ORAL_TABLET | Freq: Two times a day (BID) | ORAL | Status: DC

## 2013-10-14 ENCOUNTER — Telehealth (HOSPITAL_COMMUNITY): Payer: Self-pay

## 2013-10-14 NOTE — Telephone Encounter (Signed)
Encounter complete. 

## 2013-10-16 ENCOUNTER — Encounter (HOSPITAL_COMMUNITY): Payer: BC Managed Care – PPO

## 2013-10-16 ENCOUNTER — Telehealth (HOSPITAL_COMMUNITY): Payer: Self-pay

## 2013-10-16 NOTE — Telephone Encounter (Signed)
Encounter complete. 

## 2013-10-21 NOTE — Telephone Encounter (Signed)
Encounter complete. 

## 2013-10-27 ENCOUNTER — Telehealth (HOSPITAL_COMMUNITY): Payer: Self-pay

## 2013-10-27 NOTE — Telephone Encounter (Signed)
Encounter complete. 

## 2013-10-29 ENCOUNTER — Ambulatory Visit (HOSPITAL_COMMUNITY)
Admission: RE | Admit: 2013-10-29 | Discharge: 2013-10-29 | Disposition: A | Discharge status: Home / Self Care | Payer: BC Managed Care – PPO | Source: Ambulatory Visit | Attending: Cardiology | Admitting: Cardiology

## 2013-10-29 DIAGNOSIS — I471 Supraventricular tachycardia: Secondary | ICD-10-CM | POA: Insufficient documentation

## 2013-10-29 DIAGNOSIS — I472 Ventricular tachycardia: Secondary | ICD-10-CM | POA: Insufficient documentation

## 2013-10-29 DIAGNOSIS — I493 Ventricular premature depolarization: Secondary | ICD-10-CM | POA: Insufficient documentation

## 2013-10-29 DIAGNOSIS — R0789 Other chest pain: Secondary | ICD-10-CM | POA: Insufficient documentation

## 2013-10-29 NOTE — Procedures (Signed)
Exercise Treadmill Test Test  Exercise Tolerance Test Ordering MD: Virl Axe, MD    Unique Test No: 1  Treadmill:  1  Indication for ETT: chest pain - rule out ischemia  Contraindication to ETT: No   Stress Modality: exercise - treadmill  Cardiac Imaging Performed: non   Protocol: standard Bruce - maximal  Max BP:  156/95  Max MPHR (bpm):  155 85% MPR (bpm):  132  MPHR obtained (bpm):  153 % MPHR obtained:  98  Reached 85% MPHR (min:sec):   Total Exercise Time (min-sec):  7:17  Workload in METS:  8.9 Borg Scale: 15  Reason ETT Terminated:  5 Vent.-beat runs, marked SOB Dr. Debara Pickett OK'd  D/C Patient   ST Segment Analysis At Rest: Sinus rhythm, frequent PVCs, cannot R/O prior septal MI, inferior lateral TWI. With Exercise: no evidence of significant ST depression  Other Information Arrhythmia:  Intermittent ventricular pacing; PVCs; couplets; NSVT (longest 5 beats); brief PAT. Angina during ETT:  absent (0) Quality of ETT:  diagnostic  ETT Interpretation:  normal - no evidence of ischemia by ST analysis  Comments: ETT with fair exercise tolerance; normal BP response; no chest pain; no ST changes; negative adequate ETT. Patient noted to have frequent PVCs, occasional couplets, NSVT (longest 5 beats) and brief PAT.  Kirk Ruths

## 2013-10-30 ENCOUNTER — Ambulatory Visit (INDEPENDENT_AMBULATORY_CARE_PROVIDER_SITE_OTHER): Payer: BC Managed Care – PPO | Admitting: Internal Medicine

## 2013-10-30 ENCOUNTER — Ambulatory Visit (INDEPENDENT_AMBULATORY_CARE_PROVIDER_SITE_OTHER): Payer: BLUE CROSS/BLUE SHIELD | Admitting: Internal Medicine

## 2013-10-30 ENCOUNTER — Encounter: Payer: Self-pay | Admitting: Internal Medicine

## 2013-10-30 ENCOUNTER — Encounter: Payer: BC Managed Care – PPO | Admitting: *Deleted

## 2013-10-30 VITALS — BP 132/74 | HR 63 | Ht 73.0 in | Wt 213.0 lb

## 2013-10-30 DIAGNOSIS — I10 Essential (primary) hypertension: Secondary | ICD-10-CM

## 2013-10-30 DIAGNOSIS — Z45018 Encounter for adjustment and management of other part of cardiac pacemaker: Secondary | ICD-10-CM

## 2013-10-30 DIAGNOSIS — Z23 Encounter for immunization: Secondary | ICD-10-CM

## 2013-10-30 DIAGNOSIS — I495 Sick sinus syndrome: Secondary | ICD-10-CM | POA: Diagnosis not present

## 2013-10-30 DIAGNOSIS — E782 Mixed hyperlipidemia: Secondary | ICD-10-CM

## 2013-10-30 LAB — MDC_IDC_ENUM_SESS_TYPE_INCLINIC
Battery Impedance: 457 Ohm
Battery Remaining Longevity: 90 mo
Battery Voltage: 2.79 V
Brady Statistic AP VP Percent: 3 %
Brady Statistic AP VS Percent: 91 %
Date Time Interrogation Session: 20151030093745
Lead Channel Impedance Value: 437 Ohm
Lead Channel Impedance Value: 486 Ohm
Lead Channel Setting Pacing Amplitude: 2 V
Lead Channel Setting Pacing Pulse Width: 0.4 ms
Lead Channel Setting Sensing Sensitivity: 5.6 mV
MDC IDC SET LEADCHNL RV PACING AMPLITUDE: 2.5 V
MDC IDC STAT BRADY AS VP PERCENT: 0 %
MDC IDC STAT BRADY AS VS PERCENT: 5 %

## 2013-10-30 LAB — LIPID PANEL
CHOL/HDL RATIO: 4
CHOLESTEROL: 104 mg/dL (ref 0–200)
HDL: 23.7 mg/dL — ABNORMAL LOW (ref >39.00)
LDL CALC: 54 mg/dL (ref 0–99)
NonHDL: 80.3
TRIGLYCERIDES: 131 mg/dL (ref 0.0–149.0)
VLDL: 26.2 mg/dL (ref 0.0–40.0)

## 2013-10-30 LAB — BASIC METABOLIC PANEL
BUN: 12 mg/dL (ref 6–23)
CALCIUM: 9.5 mg/dL (ref 8.4–10.5)
CO2: 27 mEq/L (ref 19–32)
Chloride: 104 mEq/L (ref 96–112)
Creatinine, Ser: 1.2 mg/dL (ref 0.4–1.5)
GFR: 63.2 mL/min (ref >60.00)
Glucose, Bld: 86 mg/dL (ref 70–99)
POTASSIUM: 4 meq/L (ref 3.5–5.1)
Sodium: 138 mEq/L (ref 135–145)

## 2013-10-30 LAB — CBC
HEMATOCRIT: 43 % (ref 39.0–52.0)
Hemoglobin: 14.6 g/dL (ref 13.0–17.0)
MCHC: 34 g/dL (ref 30.0–36.0)
MCV: 89.9 fl (ref 78.0–100.0)
Platelets: 141 10*3/uL — ABNORMAL LOW (ref 150.0–400.0)
RBC: 4.78 Mil/uL (ref 4.22–5.81)
RDW: 13.4 % (ref 11.5–15.5)
WBC: 5.7 10*3/uL (ref 4.0–10.5)

## 2013-10-30 LAB — AST: AST: 28 U/L (ref 0–37)

## 2013-10-30 NOTE — Progress Notes (Signed)
HPI Patient is a 68 year old with a history ofPVCs, aortic insufficiency, mildly dilated aorta (45 mm by CT), braadycardia , chest pain ( Myoview with inferior defect fixed  LVEF 41%.  Cardiac cath with irregularities of LAD, 25% acute marginal RCA  LVEF 65% with mod AI Echo in July aortic root 43 Mild aortic regurg.  I saw the patinet in 2013 He is also followed by [name].  Seen recently.  Set up for echo and stress test.  Echo showed mild aortic diitation.   Stress test yesterday showed max HR 99  5 beats NSVT  The patient complains of intermittent chest tightness when he tries to do activity.  He has had chronically   None at rest Allergies  Allergen Reactions  . Codeine Nausea Only    Current Outpatient Prescriptions  Medication Sig Dispense Refill  . amLODipine (NORVASC) 5 MG tablet TAKE 1 TABLET (5 MG TOTAL) BY MOUTH DAILY.  90 tablet  1  . carvedilol (COREG) 12.5 MG tablet Take 1 tablet (12.5 mg total) by mouth 2 (two) times daily with a meal.  180 tablet  2  . flecainide (TAMBOCOR) 150 MG tablet TAKE 1/2 TABLET 2 TIMES A DAY  90 tablet  3  . atorvastatin (LIPITOR) 10 MG tablet TAKE 1 TABLET (10 MG TOTAL) BY MOUTH DAILY.  90 tablet  3  . finasteride (PROSCAR) 5 MG tablet Take 5 mg by mouth daily.        . meloxicam (MOBIC) 7.5 MG tablet daily.       . Tamsulosin HCl (FLOMAX) 0.4 MG CAPS Take 0.4 mg by mouth daily.         No current facility-administered medications for this visit.    Past Medical History  Diagnosis Date  . Sleep apnea   . Palpitations   . Dyslipidemia     failed niaspan  . Bradycardia     treated with pacemaker placement- Dr. Virl Axe MD  . HTN (hypertension)   . PVCs (premature ventricular contractions)   . Pacemaker reprogramming/check   . PACEMAKER-Medtronic 08/31/2008    Qualifier: Diagnosis of  By: Caryl Comes, MD, [facility], Mack Guise     Past Surgical History  Procedure Laterality Date  . Permanent pacemaker  2009  . Toisillectomy       Family History  Problem Relation Age of Onset  . Hypertension Mother   . Hypertension Brother   . Heart failure Father     Hx of CHF. died of MI  . Stomach cancer Neg Hx   . Esophageal cancer Neg Hx   . Colon cancer Maternal Grandfather     History   Social History  . Marital Status: Married    Spouse Name: N/A    Number of Children: N/A  . Years of Education: N/A   Occupational History  . Not on file.   Social History Main Topics  . Smoking status: Never Smoker   . Smokeless tobacco: Never Used  . Alcohol Use: No  . Drug Use: No  . Sexual Activity: Not on file   Other Topics Concern  . Not on file   Social History Narrative   Married, 3 daughters. 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 Drinks 3 caffeinated beverages/day.     Review of Systems:  All systems reviewed.  They are negative to the above problem except as previously stated.  Vital Signs: BP 132/74  Pulse 63  Ht 6\' 1"  (1.854 m)  Wt  213 lb (96.616 kg)  BMI 28.11 kg/m2  SpO2 98%  Physical Exam Patient is in NAD HEENT:  Normocephalic, atraumatic. EOMI, PERRLA.  Neck: JVP is normal.  No bruits.  Lungs: clear to auscultation. No rales no wheezes.  Heart: Regular rate and rhythm. Normal S1, S2. No S3.   No signif diastolic murmurs. PMI not displaced.  Abdomen:  Supple, nontender. Normal bowel sounds. No masses. No hepatomegaly.  Extremities:   Good distal pulses throughout. No lower extremity edema.  Musculoskeletal :moving all extremities.  Neuro:   alert and oriented x3.  CN II-XII grossly intact.  EKG  Atrial paced 63 bpm.  T wave inversion inferolaterally.  Assessment and Plan:  1.  Chest tightness.  Patient with mild CAD  I am not convinced symptoms represent coronary ischemia.   Question if relatd to not being able to iuncrease heart rate as noted on GXT  Had pacer interrogated with guidance from [employer].  Few PVCs noted.  HR increased to 70 bpm baseline and ramping increased.   WIll follow  Patient will call if feels worse   2.  HTN  Adequate control    3.  CAD  Mild by cath  4.  Aortic dilitation.  WIll need periodic F/U with echo or CT  Appears stable.   5.  AI  Appears stable    6.  .Sinus node dysfunction  Pacemaker-Medtronic  7.  PVCs-continue Flecanide

## 2013-10-30 NOTE — Progress Notes (Signed)
The device was reporgrammed to augment rate response  The graphic data was more concerning than was the tabular data but we will see how he does

## 2013-10-30 NOTE — Patient Instructions (Addendum)
Your physician recommends that you continue on your current medications as directed. Please refer to the Current Medication list given to you today. Your physician recommends that you return for lab work in: (lipids, ast, cbc, bmet)   Your physician wants you to follow-up in: 6 months with Dr. [name].  You will receive a reminder letter in the mail two months in advance. If you don't receive a letter, please call our office to schedule the follow-up appointment.

## 2013-11-19 ENCOUNTER — Telehealth: Payer: Self-pay | Admitting: Cardiology

## 2013-11-19 ENCOUNTER — Ambulatory Visit (INDEPENDENT_AMBULATORY_CARE_PROVIDER_SITE_OTHER): Payer: BC Managed Care – PPO | Admitting: *Deleted

## 2013-11-19 DIAGNOSIS — R001 Bradycardia, unspecified: Secondary | ICD-10-CM

## 2013-11-19 NOTE — Telephone Encounter (Signed)
Spoke with pt and reminded pt of remote transmission that is due today. Pt verbalized understanding.   

## 2013-11-20 DIAGNOSIS — R001 Bradycardia, unspecified: Secondary | ICD-10-CM

## 2013-11-20 LAB — MDC_IDC_ENUM_SESS_TYPE_REMOTE
Battery Impedance: 482 Ohm
Battery Remaining Longevity: 83 mo
Battery Voltage: 2.79 V
Brady Statistic AS VS Percent: 1 %
Lead Channel Impedance Value: 493 Ohm
Lead Channel Pacing Threshold Pulse Width: 0.4 ms
Lead Channel Setting Pacing Amplitude: 2 V
Lead Channel Setting Pacing Amplitude: 2.5 V
Lead Channel Setting Pacing Pulse Width: 0.4 ms
Lead Channel Setting Sensing Sensitivity: 4 mV
MDC IDC MSMT LEADCHNL RA IMPEDANCE VALUE: 424 Ohm
MDC IDC MSMT LEADCHNL RA PACING THRESHOLD AMPLITUDE: 0.5 V
MDC IDC MSMT LEADCHNL RA PACING THRESHOLD PULSEWIDTH: 0.4 ms
MDC IDC MSMT LEADCHNL RV PACING THRESHOLD AMPLITUDE: 0.5 V
MDC IDC MSMT LEADCHNL RV SENSING INTR AMPL: 11.2 mV
MDC IDC SESS DTM: 20151120111044
MDC IDC STAT BRADY AP VP PERCENT: 8 %
MDC IDC STAT BRADY AP VS PERCENT: 90 %
MDC IDC STAT BRADY AS VP PERCENT: 1 %

## 2013-11-20 NOTE — Progress Notes (Signed)
Remote pacemaker transmission.   

## 2013-12-07 ENCOUNTER — Encounter: Payer: Self-pay | Admitting: Cardiology

## 2013-12-14 ENCOUNTER — Encounter: Payer: Self-pay | Admitting: Internal Medicine

## 2014-01-26 ENCOUNTER — Other Ambulatory Visit: Payer: Self-pay | Admitting: Internal Medicine

## 2014-02-19 ENCOUNTER — Encounter: Payer: Self-pay | Admitting: Gastroenterology

## 2014-02-22 ENCOUNTER — Ambulatory Visit (INDEPENDENT_AMBULATORY_CARE_PROVIDER_SITE_OTHER): Payer: BLUE CROSS/BLUE SHIELD | Admitting: *Deleted

## 2014-02-22 ENCOUNTER — Telehealth: Payer: Self-pay | Admitting: Cardiology

## 2014-02-22 DIAGNOSIS — R001 Bradycardia, unspecified: Secondary | ICD-10-CM | POA: Diagnosis not present

## 2014-02-22 NOTE — Telephone Encounter (Signed)
Spoke with pt and reminded pt of remote transmission that is due today. Pt verbalized understanding.   

## 2014-02-23 NOTE — Progress Notes (Signed)
Remote pacemaker transmission.   

## 2014-02-25 LAB — MDC_IDC_ENUM_SESS_TYPE_REMOTE
Battery Impedance: 506 Ohm
Battery Remaining Longevity: 82 mo
Brady Statistic AP VP Percent: 5 %
Brady Statistic AP VS Percent: 94 %
Brady Statistic AS VS Percent: 1 %
Date Time Interrogation Session: 20160223010002
Lead Channel Impedance Value: 525 Ohm
Lead Channel Pacing Threshold Amplitude: 0.75 V
Lead Channel Pacing Threshold Pulse Width: 0.4 ms
Lead Channel Sensing Intrinsic Amplitude: 11.2 mV
Lead Channel Setting Pacing Amplitude: 2 V
Lead Channel Setting Sensing Sensitivity: 5.6 mV
MDC IDC MSMT BATTERY VOLTAGE: 2.79 V
MDC IDC MSMT LEADCHNL RA IMPEDANCE VALUE: 437 Ohm
MDC IDC MSMT LEADCHNL RA PACING THRESHOLD AMPLITUDE: 0.375 V
MDC IDC MSMT LEADCHNL RA PACING THRESHOLD PULSEWIDTH: 0.4 ms
MDC IDC SET LEADCHNL RV PACING AMPLITUDE: 2.5 V
MDC IDC SET LEADCHNL RV PACING PULSEWIDTH: 0.4 ms
MDC IDC STAT BRADY AS VP PERCENT: 0 %

## 2014-03-01 ENCOUNTER — Other Ambulatory Visit: Payer: Self-pay | Admitting: Internal Medicine

## 2014-03-02 ENCOUNTER — Encounter: Payer: Self-pay | Admitting: Internal Medicine

## 2014-03-10 ENCOUNTER — Encounter: Payer: Self-pay | Admitting: *Deleted

## 2014-03-11 ENCOUNTER — Encounter: Payer: Self-pay | Admitting: Internal Medicine

## 2014-03-13 DIAGNOSIS — J069 Acute upper respiratory infection, unspecified: Secondary | ICD-10-CM | POA: Diagnosis not present

## 2014-05-07 ENCOUNTER — Encounter: Payer: Self-pay | Admitting: Internal Medicine

## 2014-05-07 ENCOUNTER — Ambulatory Visit (INDEPENDENT_AMBULATORY_CARE_PROVIDER_SITE_OTHER): Payer: BLUE CROSS/BLUE SHIELD | Admitting: Internal Medicine

## 2014-05-07 VITALS — BP 122/60 | HR 71 | Ht 72.0 in | Wt 214.6 lb

## 2014-05-07 DIAGNOSIS — I159 Secondary hypertension, unspecified: Secondary | ICD-10-CM

## 2014-05-07 NOTE — Patient Instructions (Signed)
Your physician wants you to follow-up in: 1 year with Dr. [name]. You will receive a reminder letter in the mail two months in advance. If you don't receive a letter, please call our office to schedule the follow-up appointment.  Your physician recommends that you continue on your current medications as directed. Please refer to the Current Medication list given to you today.

## 2014-05-07 NOTE — Progress Notes (Signed)
Cardiology Office Note   Date:  05/07/2014   ID:  ZACARI STIFF, DOB 04/30/1945, MRN [id number]  PCP:  Anthoney Harada, MD  Cardiologist:   Dorris Carnes, MD   No chief complaint on file.     History of Present Illness: Patrick Weaver is a 69 y.o. male with a history of PVCs, mildly dilated aorta., CP, bradycardia  He is s/p PPM I saw him in the fall  At that time he was complaining of some exertional symptoms  His pacemaker settings were adjusted.  Since thine he has felt much better  Breathing good  No chest tightness     Current Outpatient Prescriptions  Medication Sig Dispense Refill  . amLODipine (NORVASC) 5 MG tablet TAKE 1 TABLET BY MOUTH ONCE DAILY 90 tablet 1  . atorvastatin (LIPITOR) 10 MG tablet TAKE 1 TABLET (10 MG TOTAL) BY MOUTH DAILY. 90 tablet 3  . carvedilol (COREG) 6.25 MG tablet TAKE 1 TABLET BY MOUTH TWICE A DAY 180 tablet 1  . flecainide (TAMBOCOR) 150 MG tablet TAKE 1/2 TABLET 2 TIMES A DAY 90 tablet 3  . Tamsulosin HCl (FLOMAX) 0.4 MG CAPS Take 0.4 mg by mouth daily.       No current facility-administered medications for this visit.    Allergies:   Codeine   Past Medical History  Diagnosis Date  . Sleep apnea   . Palpitations   . Dyslipidemia     failed niaspan  . Bradycardia     treated with pacemaker placement- Dr. Virl Axe MD  . HTN (hypertension)   . PVCs (premature ventricular contractions)   . Pacemaker reprogramming/check   . PACEMAKER-Medtronic 08/31/2008    Qualifier: Diagnosis of  By: Caryl Comes, MD, [facility], Mack Guise     Past Surgical History  Procedure Laterality Date  . Permanent pacemaker  2009  . Toisillectomy       Social History:  The patient  reports that he has never smoked. He has never used smokeless tobacco. He reports that he does not drink alcohol or use illicit drugs.   Family History:  The patient's family history includes Cancer in his mother; Colon cancer in his maternal grandfather; Heart disease in his  father; Heart failure in his father; Hypertension in his brother and mother. There is no history of Stomach cancer or Esophageal cancer.    ROS:  Please see the history of present illness. All other systems are reviewed and  Negative to the above problem except as noted.    PHYSICAL EXAM: VS:  BP 122/60 mmHg  Pulse 71  Ht 6' (1.829 m)  Wt 214 lb 9.6 oz (97.342 kg)  BMI 29.10 kg/m2  GEN: Well nourished, well developed, in no acute distress HEENT: normal Neck: no JVD, carotid bruits, or masses Cardiac: RRR; no murmurs, rubs, or gallops,no edema  Respiratory:  clear to auscultation bilaterally, normal work of breathing GI: soft, nontender, nondistended, + BS  No hepatomegaly  MS: no deformity Moving all extremities   Skin: warm and dry, no rash Neuro:  Strength and sensation are intact Psych: euthymic mood, full affect   EKG:  EKG is not ordered today.   Lipid Panel    Component Value Date/Time   CHOL 104 10/30/2013 0947   TRIG 131.0 10/30/2013 0947   HDL 23.70* 10/30/2013 0947   CHOLHDL 4 10/30/2013 0947   VLDL 26.2 10/30/2013 0947   LDLCALC 54 10/30/2013 0947   LDLDIRECT 98.6 05/30/2006 0856  Wt Readings from Last 3 Encounters:  05/07/14 214 lb 9.6 oz (97.342 kg)  10/30/13 213 lb (96.616 kg)  08/18/13 211 lb (95.709 kg)      ASSESSMENT AND PLAN:  1.  Bradycardia  S/p PPM  Doing good  2.  HTN  Good control     3.  PVCs  Denies palpitations.  Will follow on pacemaker    Disposition:   FU with SK in the fall.  I will see him next spring.    Signed, Dorris Carnes, MD  05/07/2014 12:26 PM    [address], [place], [place]  [id number] Phone: [phone]; Fax: [phone]

## 2014-05-26 ENCOUNTER — Encounter: Payer: BLUE CROSS/BLUE SHIELD | Admitting: *Deleted

## 2014-05-26 ENCOUNTER — Telehealth: Payer: Self-pay | Admitting: Cardiology

## 2014-05-26 NOTE — Telephone Encounter (Signed)
Spoke with pt and reminded pt of remote transmission that is due today. Pt verbalized understanding.   

## 2014-05-27 ENCOUNTER — Encounter: Payer: Self-pay | Admitting: Cardiology

## 2014-06-09 ENCOUNTER — Encounter: Payer: Self-pay | Admitting: Internal Medicine

## 2014-06-09 ENCOUNTER — Ambulatory Visit (INDEPENDENT_AMBULATORY_CARE_PROVIDER_SITE_OTHER): Payer: 59 | Admitting: *Deleted

## 2014-06-09 DIAGNOSIS — R001 Bradycardia, unspecified: Secondary | ICD-10-CM | POA: Diagnosis not present

## 2014-06-11 DIAGNOSIS — N210 Calculus in bladder: Secondary | ICD-10-CM | POA: Diagnosis not present

## 2014-06-11 DIAGNOSIS — R300 Dysuria: Secondary | ICD-10-CM | POA: Diagnosis not present

## 2014-06-11 DIAGNOSIS — R350 Frequency of micturition: Secondary | ICD-10-CM | POA: Diagnosis not present

## 2014-06-11 DIAGNOSIS — N401 Enlarged prostate with lower urinary tract symptoms: Secondary | ICD-10-CM | POA: Diagnosis not present

## 2014-06-11 NOTE — Progress Notes (Signed)
Remote pacemaker transmission.   

## 2014-06-13 LAB — CUP PACEART REMOTE DEVICE CHECK
Battery Impedance: 608 Ohm
Battery Remaining Longevity: 75 mo
Brady Statistic AP VS Percent: 93 %
Brady Statistic AS VP Percent: 0 %
Lead Channel Impedance Value: 430 Ohm
Lead Channel Pacing Threshold Amplitude: 0.5 V
Lead Channel Pacing Threshold Amplitude: 0.625 V
Lead Channel Pacing Threshold Pulse Width: 0.4 ms
Lead Channel Sensing Intrinsic Amplitude: 11.2 mV
Lead Channel Setting Pacing Amplitude: 2 V
Lead Channel Setting Sensing Sensitivity: 4 mV
MDC IDC MSMT BATTERY VOLTAGE: 2.78 V
MDC IDC MSMT LEADCHNL RA PACING THRESHOLD PULSEWIDTH: 0.4 ms
MDC IDC MSMT LEADCHNL RV IMPEDANCE VALUE: 468 Ohm
MDC IDC SESS DTM: 20160608223931
MDC IDC SET LEADCHNL RV PACING AMPLITUDE: 2.5 V
MDC IDC SET LEADCHNL RV PACING PULSEWIDTH: 0.4 ms
MDC IDC STAT BRADY AP VP PERCENT: 6 %
MDC IDC STAT BRADY AS VS PERCENT: 1 %

## 2014-06-18 DIAGNOSIS — N210 Calculus in bladder: Secondary | ICD-10-CM | POA: Diagnosis not present

## 2014-06-18 DIAGNOSIS — R3916 Straining to void: Secondary | ICD-10-CM | POA: Diagnosis not present

## 2014-06-18 DIAGNOSIS — N401 Enlarged prostate with lower urinary tract symptoms: Secondary | ICD-10-CM | POA: Diagnosis not present

## 2014-06-23 ENCOUNTER — Other Ambulatory Visit: Payer: Self-pay | Admitting: Urology

## 2014-06-24 ENCOUNTER — Encounter (HOSPITAL_BASED_OUTPATIENT_CLINIC_OR_DEPARTMENT_OTHER): Payer: Self-pay

## 2014-06-24 ENCOUNTER — Emergency Department (HOSPITAL_BASED_OUTPATIENT_CLINIC_OR_DEPARTMENT_OTHER): Payer: 59

## 2014-06-24 ENCOUNTER — Inpatient Hospital Stay (HOSPITAL_BASED_OUTPATIENT_CLINIC_OR_DEPARTMENT_OTHER)
Admission: EM | Admit: 2014-06-24 | Discharge: 2014-06-26 | DRG: 690 | Disposition: A | Discharge status: Home / Self Care | Payer: 59 | Attending: Internal Medicine | Admitting: Internal Medicine

## 2014-06-24 DIAGNOSIS — I5032 Chronic diastolic (congestive) heart failure: Secondary | ICD-10-CM | POA: Diagnosis not present

## 2014-06-24 DIAGNOSIS — I712 Thoracic aortic aneurysm, without rupture, unspecified: Secondary | ICD-10-CM | POA: Diagnosis present

## 2014-06-24 DIAGNOSIS — N433 Hydrocele, unspecified: Secondary | ICD-10-CM | POA: Diagnosis not present

## 2014-06-24 DIAGNOSIS — N183 Chronic kidney disease, stage 3 (moderate): Secondary | ICD-10-CM | POA: Diagnosis present

## 2014-06-24 DIAGNOSIS — Z8249 Family history of ischemic heart disease and other diseases of the circulatory system: Secondary | ICD-10-CM | POA: Diagnosis not present

## 2014-06-24 DIAGNOSIS — G4730 Sleep apnea, unspecified: Secondary | ICD-10-CM | POA: Diagnosis present

## 2014-06-24 DIAGNOSIS — E785 Hyperlipidemia, unspecified: Secondary | ICD-10-CM | POA: Diagnosis present

## 2014-06-24 DIAGNOSIS — N210 Calculus in bladder: Secondary | ICD-10-CM | POA: Diagnosis not present

## 2014-06-24 DIAGNOSIS — I10 Essential (primary) hypertension: Secondary | ICD-10-CM | POA: Diagnosis not present

## 2014-06-24 DIAGNOSIS — N400 Enlarged prostate without lower urinary tract symptoms: Secondary | ICD-10-CM | POA: Diagnosis not present

## 2014-06-24 DIAGNOSIS — Z79899 Other long term (current) drug therapy: Secondary | ICD-10-CM

## 2014-06-24 DIAGNOSIS — Z800 Family history of malignant neoplasm of digestive organs: Secondary | ICD-10-CM

## 2014-06-24 DIAGNOSIS — N3289 Other specified disorders of bladder: Secondary | ICD-10-CM | POA: Diagnosis present

## 2014-06-24 DIAGNOSIS — N508 Other specified disorders of male genital organs: Secondary | ICD-10-CM | POA: Diagnosis not present

## 2014-06-24 DIAGNOSIS — N50819 Testicular pain, unspecified: Secondary | ICD-10-CM

## 2014-06-24 DIAGNOSIS — R109 Unspecified abdominal pain: Secondary | ICD-10-CM | POA: Diagnosis not present

## 2014-06-24 DIAGNOSIS — Z885 Allergy status to narcotic agent status: Secondary | ICD-10-CM

## 2014-06-24 DIAGNOSIS — N390 Urinary tract infection, site not specified: Principal | ICD-10-CM | POA: Diagnosis present

## 2014-06-24 DIAGNOSIS — Z950 Presence of cardiac pacemaker: Secondary | ICD-10-CM | POA: Diagnosis present

## 2014-06-24 DIAGNOSIS — N281 Cyst of kidney, acquired: Secondary | ICD-10-CM | POA: Diagnosis not present

## 2014-06-24 DIAGNOSIS — I129 Hypertensive chronic kidney disease with stage 1 through stage 4 chronic kidney disease, or unspecified chronic kidney disease: Secondary | ICD-10-CM | POA: Diagnosis present

## 2014-06-24 DIAGNOSIS — D72829 Elevated white blood cell count, unspecified: Secondary | ICD-10-CM | POA: Insufficient documentation

## 2014-06-24 DIAGNOSIS — N401 Enlarged prostate with lower urinary tract symptoms: Secondary | ICD-10-CM | POA: Diagnosis present

## 2014-06-24 DIAGNOSIS — R338 Other retention of urine: Secondary | ICD-10-CM | POA: Diagnosis present

## 2014-06-24 DIAGNOSIS — R42 Dizziness and giddiness: Secondary | ICD-10-CM | POA: Diagnosis not present

## 2014-06-24 HISTORY — DX: Calculus in bladder: N21.0

## 2014-06-24 LAB — COMPREHENSIVE METABOLIC PANEL
ALT: 24 U/L (ref 17–63)
AST: 20 U/L (ref 15–41)
Albumin: 4.3 g/dL (ref 3.5–5.0)
Alkaline Phosphatase: 83 U/L (ref 38–126)
Anion gap: 9 (ref 5–15)
BILIRUBIN TOTAL: 1 mg/dL (ref 0.3–1.2)
BUN: 14 mg/dL (ref 6–20)
CHLORIDE: 102 mmol/L (ref 101–111)
CO2: 25 mmol/L (ref 22–32)
CREATININE: 1.32 mg/dL — AB (ref 0.61–1.24)
Calcium: 9 mg/dL (ref 8.9–10.3)
GFR, EST NON AFRICAN AMERICAN: 55 mL/min — AB (ref >60)
GLUCOSE: 110 mg/dL — AB (ref 65–99)
Potassium: 3.7 mmol/L (ref 3.5–5.1)
Sodium: 136 mmol/L (ref 135–145)
Total Protein: 7.4 g/dL (ref 6.5–8.1)

## 2014-06-24 LAB — URINALYSIS, ROUTINE W REFLEX MICROSCOPIC
Bilirubin Urine: NEGATIVE
GLUCOSE, UA: NEGATIVE mg/dL
HGB URINE DIPSTICK: NEGATIVE
Ketones, ur: NEGATIVE mg/dL
Nitrite: NEGATIVE
PROTEIN: 30 mg/dL — AB
Specific Gravity, Urine: 1.02 (ref 1.005–1.030)
Urobilinogen, UA: 0.2 mg/dL (ref 0.0–1.0)
pH: 5.5 (ref 5.0–8.0)

## 2014-06-24 LAB — URINE MICROSCOPIC-ADD ON

## 2014-06-24 LAB — TROPONIN I: Troponin I: <0.03 ng/mL (ref <0.031)

## 2014-06-24 LAB — CBC WITH DIFFERENTIAL/PLATELET
Basophils Absolute: 0 10*3/uL (ref 0.0–0.1)
Basophils Relative: 0 % (ref 0–1)
Eosinophils Absolute: 0.2 10*3/uL (ref 0.0–0.7)
Eosinophils Relative: 1 % (ref 0–5)
HCT: 42.6 % (ref 39.0–52.0)
Hemoglobin: 14.8 g/dL (ref 13.0–17.0)
Lymphocytes Relative: 8 % — ABNORMAL LOW (ref 12–46)
Lymphs Abs: 1 10*3/uL (ref 0.7–4.0)
MCH: 31.2 pg (ref 26.0–34.0)
MCHC: 34.7 g/dL (ref 30.0–36.0)
MCV: 89.7 fL (ref 78.0–100.0)
Monocytes Absolute: 0.9 10*3/uL (ref 0.1–1.0)
Monocytes Relative: 7 % (ref 3–12)
Neutro Abs: 11.3 10*3/uL — ABNORMAL HIGH (ref 1.7–7.7)
Neutrophils Relative %: 84 % — ABNORMAL HIGH (ref 43–77)
PLATELETS: 132 10*3/uL — AB (ref 150–400)
RBC: 4.75 MIL/uL (ref 4.22–5.81)
RDW: 13.2 % (ref 11.5–15.5)
WBC: 13.4 10*3/uL — ABNORMAL HIGH (ref 4.0–10.5)

## 2014-06-24 LAB — I-STAT CG4 LACTIC ACID, ED: LACTIC ACID, VENOUS: 1.46 mmol/L (ref 0.5–2.0)

## 2014-06-24 MED ORDER — ONDANSETRON HCL 4 MG/2ML IJ SOLN
4.0000 mg | Freq: Once | INTRAMUSCULAR | Status: AC
  Administered 2014-06-24: 4 mg via INTRAVENOUS
  Filled 2014-06-24: qty 2

## 2014-06-24 MED ORDER — SODIUM CHLORIDE 0.9 % IV SOLN
INTRAVENOUS | Status: DC
  Administered 2014-06-25: 02:00:00 via INTRAVENOUS

## 2014-06-24 MED ORDER — DEXTROSE 5 % IV SOLN
1.0000 g | Freq: Once | INTRAVENOUS | Status: AC
  Administered 2014-06-24: 1 g via INTRAVENOUS

## 2014-06-24 MED ORDER — MORPHINE SULFATE 4 MG/ML IJ SOLN
4.0000 mg | Freq: Once | INTRAMUSCULAR | Status: AC
  Administered 2014-06-24: 4 mg via INTRAVENOUS
  Filled 2014-06-24: qty 1

## 2014-06-24 MED ORDER — CEFTRIAXONE SODIUM 1 G IJ SOLR
INTRAMUSCULAR | Status: AC
  Filled 2014-06-24: qty 10

## 2014-06-24 MED ORDER — SODIUM CHLORIDE 0.9 % IV BOLUS (SEPSIS)
1000.0000 mL | Freq: Once | INTRAVENOUS | Status: AC
  Administered 2014-06-24: 1000 mL via INTRAVENOUS

## 2014-06-24 MED ORDER — ACETAMINOPHEN 325 MG PO TABS
650.0000 mg | ORAL_TABLET | Freq: Once | ORAL | Status: AC
  Administered 2014-06-24: 650 mg via ORAL
  Filled 2014-06-24: qty 2

## 2014-06-24 MED ORDER — SODIUM CHLORIDE 0.9 % IV SOLN
Freq: Once | INTRAVENOUS | Status: AC
  Administered 2014-06-24: 20:00:00 via INTRAVENOUS

## 2014-06-24 MED ORDER — MORPHINE SULFATE 2 MG/ML IJ SOLN
2.0000 mg | Freq: Once | INTRAMUSCULAR | Status: AC
  Administered 2014-06-24: 2 mg via INTRAVENOUS
  Filled 2014-06-24: qty 1

## 2014-06-24 NOTE — ED Notes (Signed)
C/o posterior and bilat sholuder pain, pain with urination with hx of "bladder stones"-fever 101 approx one hour PTA-no meds

## 2014-06-24 NOTE — ED Provider Notes (Signed)
CSN: [id number]     Arrival date & time 06/24/14  4098 History  This chart was scribed for Ezequiel Essex, MD by Stephania Fragmin, ED Scribe. This patient was seen in room MH04/MH04 and the patient's care was started at 7:21 PM   Chief Complaint  Patient presents with  . Neck Pain   The history is provided by the patient. No language interpreter was used.   HPI Comments: Patrick Weaver is a 69 y.o. male with a PMHx of bladder stones, pacemaker, aortic dilatation, and enlarged prostate (per wife) who presents to the Emergency Department complaining of dysuria that began yesterday. Patient has a history of bladder stones-"I have 4 stones in my bladder"- and thought he was passing a stone, as this felt like passing a stone, but none ever came out. He also complains of associated generalized myalgias, fever measured at 101 yesterday with a glass thermometer, chills, lightheaded dizziness, mild back pain, penile pain, and testicular pain that began today. He states he is not currently nauseated while in the room and is only dizzy while standing up, although he adds he hasn't eaten much today. Patient had a cystoscopy 6 days ago by Dr. Alinda Money and thinks the procedure may have contributed to his symptoms today. He was also treated for a possible, unconfirmed UTI at the same time and was given one dose of antibiotics, although he is not currently on antibiotics. He denies any known sick contact. Patient denies a history of MI, CAD, or CHF. He denies vomiting, abdominal pain, diarrhea, loss of appetite, SOB, headaches, or chest pain. Dr. [name] is patient's cardiologist and Dr. Caryl Comes is patient's electrophysiologist.    Past Medical History  Diagnosis Date  . Sleep apnea   . Palpitations   . Dyslipidemia     failed niaspan  . Bradycardia     treated with pacemaker placement- Dr. Virl Axe MD  . HTN (hypertension)   . PVCs (premature ventricular contractions)   . Pacemaker reprogramming/check   .  PACEMAKER-[employer] 08/31/2008    Qualifier: Diagnosis of  By: Caryl Comes, MD, Remus Blake   . Bladder stone    Past Surgical History  Procedure Laterality Date  . Permanent pacemaker  2009  . Toisillectomy     Family History  Problem Relation Age of Onset  . Hypertension Mother   . Cancer Mother   . Hypertension Brother   . Heart failure Father     Hx of CHF. died of MI  . Heart disease Father   . Stomach cancer Neg Hx   . Esophageal cancer Neg Hx   . Colon cancer Maternal Grandfather    History  Substance Use Topics  . Smoking status: Never Smoker   . Smokeless tobacco: Never Used  . Alcohol Use: No    Review of Systems  Constitutional: Positive for fever (measured at 101 yesterday, per pt) and chills. Negative for appetite change.  Respiratory: Negative for shortness of breath.   Cardiovascular: Negative for chest pain.  Gastrointestinal: Positive for nausea. Negative for vomiting, abdominal pain and diarrhea.  Genitourinary: Positive for dysuria, penile pain and testicular pain.  Musculoskeletal: Positive for back pain (pt endorses mild back pain) and neck pain.  Neurological: Positive for dizziness and light-headedness. Negative for headaches.  A complete 10 system review of systems was obtained and all systems are negative except as noted in the HPI and PMH.    Allergies  Codeine  Home Medications   Prior to Admission medications  Medication Sig Start Date End Date Taking? Authorizing Provider  mirabegron ER (MYRBETRIQ) 25 MG TB24 tablet Take 25 mg by mouth daily.   Yes Historical Provider, MD  amLODipine (NORVASC) 5 MG tablet TAKE 1 TABLET BY MOUTH ONCE DAILY 03/02/14   Deboraha Sprang, MD  atorvastatin (LIPITOR) 10 MG tablet TAKE 1 TABLET (10 MG TOTAL) BY MOUTH DAILY. 08/21/13   Fay Records, MD  carvedilol (COREG) 6.25 MG tablet TAKE 1 TABLET BY MOUTH TWICE A DAY 01/27/14   Deboraha Sprang, MD  flecainide [facility]) 150 MG tablet TAKE 1/2 TABLET 2 TIMES A DAY  09/18/13   Deboraha Sprang, MD  Tamsulosin HCl (FLOMAX) 0.4 MG CAPS Take 0.4 mg by mouth daily.      Historical Provider, MD   BP 119/67 mmHg  Pulse 70  Temp(Src) 100.8 F (38.2 C) (Rectal)  Resp 20  Ht 5\' 10"  (1.778 m)  Wt 200 lb (90.719 kg)  BMI 28.70 kg/m2  SpO2 94% Physical Exam  Constitutional: He is oriented to person, place, and time. He appears well-developed and well-nourished. No distress.  Uncomfortable.   HENT:  Head: Normocephalic and atraumatic.  Mouth/Throat: Oropharynx is clear and moist. No oropharyngeal exudate.  Eyes: Conjunctivae and EOM are normal. Pupils are equal, round, and reactive to light.  Neck: Normal range of motion. Neck supple.  No meningismus.  Cardiovascular: Normal rate, regular rhythm, normal heart sounds and intact distal pulses.   No murmur heard. Pulmonary/Chest: Effort normal and breath sounds normal. No respiratory distress.  Pacemaker left chest.   Abdominal: Soft. There is no tenderness. There is no rebound and no guarding.  Genitourinary: Penile tenderness present.  Bilateral testicular tenderness with normal lie. No erythema. Pain to tip of penis, without visible lesion. Prostate is firm and non-tender.   Musculoskeletal: Normal range of motion. He exhibits tenderness. He exhibits no edema.  Bilateral paraspinal lumbar tenderness.   Neurological: He is alert and oriented to person, place, and time. No cranial nerve deficit. He exhibits normal muscle tone. Coordination normal.  No ataxia on finger to nose bilaterally. No pronator drift. 5/5 strength throughout. CN 2-12 intact. Negative Romberg. Equal grip strength. Sensation intact. Gait is normal.   Skin: Skin is warm.  Psychiatric: He has a normal mood and affect. His behavior is normal.  Nursing note and vitals reviewed.   ED Course  Procedures (including critical care time)  DIAGNOSTIC STUDIES: Oxygen Saturation is 98% on RA, normal by my interpretation.    COORDINATION OF  CARE: 7:30 PM - Discussed treatment plan with pt at bedside which includes diagnostic tests, including scrotal U/S, and pt agreed to plan.   Labs Review Labs Reviewed  URINALYSIS, ROUTINE W REFLEX MICROSCOPIC (NOT AT [facility]) - Abnormal; Notable for the following:    APPearance CLOUDY (*)    Protein, ur 30 (*)    Leukocytes, UA LARGE (*)    All other components within normal limits  CBC WITH DIFFERENTIAL/PLATELET - Abnormal; Notable for the following:    WBC 13.4 (*)    Platelets 132 (*)    Neutrophils Relative % 84 (*)    Neutro Abs 11.3 (*)    Lymphocytes Relative 8 (*)    All other components within normal limits  COMPREHENSIVE METABOLIC PANEL - Abnormal; Notable for the following:    Glucose, Bld 110 (*)    Creatinine, Ser 1.32 (*)    GFR calc non Af Amer 55 (*)    All other components  within normal limits  URINE MICROSCOPIC-ADD ON - Abnormal; Notable for the following:    Bacteria, UA MANY (*)    All other components within normal limits  URINE CULTURE  CULTURE, BLOOD (ROUTINE X 2)  CULTURE, BLOOD (ROUTINE X 2)  TROPONIN I  I-STAT CG4 LACTIC ACID, ED  I-STAT CG4 LACTIC ACID, ED    Imaging Review US Scrotum  06/24/2014   CLINICAL DATA:  Pain radiating down to scrotum. History of bladder stones.  EXAM: SCROTAL ULTRASOUND  DOPPLER ULTRASOUND OF THE TESTICLES  TECHNIQUE: Complete ultrasound examination of the testicles, epididymis, and other scrotal structures was performed. Color and spectral Doppler ultrasound were also utilized to evaluate blood flow to the testicles.  COMPARISON:  CT 06/24/2014  FINDINGS: Right testicle  Measurements: Normal in size and homogeneous echotexture measuring 4.4 x 2.0 x 3.5 cm.  Left testicle  Measurements: Normal in size and homogeneous echotexture measuring 5.0 x 2.0 x 3.0 cm.  Right epididymis:  Normal in size and appearance.  Left epididymis:  Normal in size and appearance.  Hydrocele:  Small bilateral hydroceles.  Varicocele:  None visualized.   Pulsed Doppler interrogation of both testes demonstrates normal low resistance arterial and venous waveforms bilaterally.  IMPRESSION: 1. Normal testicles with normal color Doppler and spectral waveforms. 2. Bilateral small hydroceles.   Electronically Signed   By: Suzy Bouchard M.D.   On: 06/24/2014 21:16   [place] Art/ven Flow Abd Pelv Doppler  06/24/2014   CLINICAL DATA:  Pain radiating down to scrotum. History of bladder stones.  EXAM: SCROTAL ULTRASOUND  DOPPLER ULTRASOUND OF THE TESTICLES  TECHNIQUE: Complete ultrasound examination of the testicles, epididymis, and other scrotal structures was performed. Color and spectral Doppler ultrasound were also utilized to evaluate blood flow to the testicles.  COMPARISON:  CT 06/24/2014  FINDINGS: Right testicle  Measurements: Normal in size and homogeneous echotexture measuring 4.4 x 2.0 x 3.5 cm.  Left testicle  Measurements: Normal in size and homogeneous echotexture measuring 5.0 x 2.0 x 3.0 cm.  Right epididymis:  Normal in size and appearance.  Left epididymis:  Normal in size and appearance.  Hydrocele:  Small bilateral hydroceles.  Varicocele:  None visualized.  Pulsed Doppler interrogation of both testes demonstrates normal low resistance arterial and venous waveforms bilaterally.  IMPRESSION: 1. Normal testicles with normal color Doppler and spectral waveforms. 2. Bilateral small hydroceles.   Electronically Signed   By: Suzy Bouchard M.D.   On: 06/24/2014 21:16   Ct Renal Stone Study  06/24/2014   CLINICAL DATA:  Acute onset of penile and scrotal pain. Dysuria. Initial encounter.  EXAM: CT ABDOMEN AND PELVIS WITHOUT CONTRAST  TECHNIQUE: Multidetector CT imaging of the abdomen and pelvis was performed following the standard protocol without IV contrast.  COMPARISON:  Abdominal images from CTA of the chest performed 07/15/2012  FINDINGS: Mild right basilar atelectasis is noted. Trace pericardial fluid remains within normal limits. Pacemaker leads are  partially imaged.  The liver and spleen are unremarkable in appearance. The gallbladder is within normal limits. The pancreas and adrenal glands are unremarkable.  Mild nonspecific perinephric stranding is noted bilaterally. A few left renal cysts are seen, measuring up to 2.5 cm in size. There is no evidence of hydronephrosis. No renal or ureteral stones are identified.  No free fluid is identified. The small bowel is unremarkable in appearance. The stomach is within normal limits. No acute vascular abnormalities are seen.  The appendix is normal in caliber and contains trace  air, without evidence of appendicitis. Minimal diverticulosis is noted along the distal descending and proximal sigmoid colon, without evidence of diverticulitis.  The bladder is mildly distended, with multiple stones noted dependently in the bladder. The prostate is markedly enlarged, measuring 7.2 cm in transverse dimension and 8.8 cm in craniocaudal dimension, with mass effect on the base of the bladder. No inguinal lymphadenopathy is seen.  No acute osseous abnormalities are identified.  IMPRESSION: 1. Multiple bladder stones again noted. The bladder is compressed by the patient's markedly enlarged prostate. 2. Markedly enlarged prostate noted, measuring 7.2 cm in transverse dimension and 8.8 cm in craniocaudal dimension. Would correlate with PSA, and consider further evaluation as deemed clinically appropriate. 3. Few small left renal cysts seen.  Kidneys otherwise unremarkable. 4. Minimal diverticulosis along the distal descending and proximal sigmoid colon, without evidence of diverticulitis. 5. Mild right basilar atelectasis noted.   Electronically Signed   By: Garald Balding M.D.   On: 06/24/2014 21:22     EKG Interpretation   Date/Time:  Thursday June 24 2014 19:16:33 EDT Ventricular Rate:  84 PR Interval:    QRS Duration: 108 QT Interval:  382 QTC Calculation: 451 R Axis:   -37 Text Interpretation:  Atrial-paced rhythm  with occasional supraventricular  complexes and with frequent Premature ventricular complexes Left axis  deviation Cannot rule out Anterior infarct , age undetermined Abnormal ECG  frequent PVCs Confirmed by Wyvonnia Dusky  MD, Elianna Windom [phone]) on 06/24/2014  7:53:18 PM      MDM   Final diagnoses:  Testicular pain  Flank pain  Urinary tract infection without hematuria, site unspecified    1 day of generalized body aches, fever, lightheadedness, dysuria, testicular and penile pain. No vomiting. Denies chest pain contrary to triage note.  Patient had cystoscopy performed 6 days ago by Dr. Alinda Money.   Patient's febrile in the ED to 101.8. UA infected. WBC 14.  Lactate normal. IVF and rocephin given. Cultures sent.  CT shows bladder stones without ureteral stones.  Enlarged prostate, nontender on exam. UTI with recent urologic instrumentation.  Febrile but does not meet sepsis criteria.  D/w Dr. Alyson Ingles who agrees patient should be admitted for IV antibiotics.   D/w Dr. Humphrey Rolls who accepts patient to [facility].  Dr. Alyson Ingles to consult.  BP 119/67 mmHg  Pulse 70  Temp(Src) 100.8 F (38.2 C) (Rectal)  Resp 20  Ht 5\' 10"  (1.778 m)  Wt 200 lb (90.719 kg)  BMI 28.70 kg/m2  SpO2 94%    I personally performed the services described in this documentation, which was scribed in my presence. The recorded information has been reviewed and is accurate.   Ezequiel Essex, MD 06/25/14 [phone]

## 2014-06-24 NOTE — ED Notes (Addendum)
Patient c/o generalized body aches.  Reports this started yesterday.  Reports dizziness.  Patient reports he felt like he was trying to "pass a stone" and reports dysuria, flank pain.  Denies vomiting.

## 2014-06-24 NOTE — ED Notes (Signed)
Patient transported to ultrasound via stretcher per tech.

## 2014-06-24 NOTE — ED Notes (Signed)
MD at bedside. 

## 2014-06-24 NOTE — ED Notes (Signed)
Pt also stated "some CP"

## 2014-06-25 ENCOUNTER — Encounter (HOSPITAL_COMMUNITY): Payer: Self-pay | Admitting: Internal Medicine

## 2014-06-25 DIAGNOSIS — N390 Urinary tract infection, site not specified: Principal | ICD-10-CM

## 2014-06-25 DIAGNOSIS — N50819 Testicular pain, unspecified: Secondary | ICD-10-CM

## 2014-06-25 DIAGNOSIS — D72829 Elevated white blood cell count, unspecified: Secondary | ICD-10-CM | POA: Insufficient documentation

## 2014-06-25 DIAGNOSIS — I10 Essential (primary) hypertension: Secondary | ICD-10-CM | POA: Diagnosis present

## 2014-06-25 LAB — CBC WITH DIFFERENTIAL/PLATELET
BASOS ABS: 0 10*3/uL (ref 0.0–0.1)
Basophils Relative: 0 % (ref 0–1)
EOS PCT: 1 % (ref 0–5)
Eosinophils Absolute: 0.1 10*3/uL (ref 0.0–0.7)
HCT: 38.5 % — ABNORMAL LOW (ref 39.0–52.0)
Hemoglobin: 13.2 g/dL (ref 13.0–17.0)
Lymphocytes Relative: 10 % — ABNORMAL LOW (ref 12–46)
Lymphs Abs: 0.9 10*3/uL (ref 0.7–4.0)
MCH: 31 pg (ref 26.0–34.0)
MCHC: 34.3 g/dL (ref 30.0–36.0)
MCV: 90.4 fL (ref 78.0–100.0)
Monocytes Absolute: 0.7 10*3/uL (ref 0.1–1.0)
Monocytes Relative: 8 % (ref 3–12)
Neutro Abs: 7.2 10*3/uL (ref 1.7–7.7)
Neutrophils Relative %: 80 % — ABNORMAL HIGH (ref 43–77)
Platelets: 116 10*3/uL — ABNORMAL LOW (ref 150–400)
RBC: 4.26 MIL/uL (ref 4.22–5.81)
RDW: 13.2 % (ref 11.5–15.5)
WBC: 9 10*3/uL (ref 4.0–10.5)

## 2014-06-25 LAB — COMPREHENSIVE METABOLIC PANEL
ALK PHOS: 78 U/L (ref 38–126)
ALT: 19 U/L (ref 17–63)
ANION GAP: 9 (ref 5–15)
AST: 15 U/L (ref 15–41)
Albumin: 3.8 g/dL (ref 3.5–5.0)
BILIRUBIN TOTAL: 0.9 mg/dL (ref 0.3–1.2)
BUN: 14 mg/dL (ref 6–20)
CALCIUM: 8.4 mg/dL — AB (ref 8.9–10.3)
CO2: 25 mmol/L (ref 22–32)
Chloride: 103 mmol/L (ref 101–111)
Creatinine, Ser: 1.21 mg/dL (ref 0.61–1.24)
GFR calc Af Amer: >60 mL/min (ref >60)
Glucose, Bld: 111 mg/dL — ABNORMAL HIGH (ref 65–99)
Potassium: 3.8 mmol/L (ref 3.5–5.1)
SODIUM: 137 mmol/L (ref 135–145)
TOTAL PROTEIN: 6.5 g/dL (ref 6.5–8.1)

## 2014-06-25 LAB — I-STAT CG4 LACTIC ACID, ED: LACTIC ACID, VENOUS: 0.83 mmol/L (ref 0.5–2.0)

## 2014-06-25 MED ORDER — CHLORHEXIDINE GLUCONATE 0.12 % MT SOLN
15.0000 mL | Freq: Two times a day (BID) | OROMUCOSAL | Status: DC
  Administered 2014-06-25 (×2): 15 mL via OROMUCOSAL
  Filled 2014-06-25 (×2): qty 15

## 2014-06-25 MED ORDER — ONDANSETRON HCL 4 MG/2ML IJ SOLN: 4.0000 mg | Freq: Four times a day (QID) | INTRAMUSCULAR | Status: DC | PRN

## 2014-06-25 MED ORDER — ONDANSETRON HCL 4 MG PO TABS: 4.0000 mg | ORAL_TABLET | Freq: Four times a day (QID) | ORAL | Status: DC | PRN

## 2014-06-25 MED ORDER — PIPERACILLIN-TAZOBACTAM 3.375 G IVPB
3.3750 g | Freq: Three times a day (TID) | INTRAVENOUS | Status: DC
  Administered 2014-06-25 – 2014-06-26 (×4): 3.375 g via INTRAVENOUS
  Filled 2014-06-25 (×6): qty 50

## 2014-06-25 MED ORDER — ACETAMINOPHEN 650 MG RE SUPP: 650.0000 mg | Freq: Four times a day (QID) | RECTAL | Status: DC | PRN

## 2014-06-25 MED ORDER — MORPHINE SULFATE 2 MG/ML IJ SOLN: 1.0000 mg | INTRAMUSCULAR | Status: DC | PRN

## 2014-06-25 MED ORDER — ENOXAPARIN SODIUM 40 MG/0.4ML ~~LOC~~ SOLN
40.0000 mg | SUBCUTANEOUS | Status: DC
  Administered 2014-06-25 – 2014-06-26 (×2): 40 mg via SUBCUTANEOUS
  Filled 2014-06-25 (×3): qty 0.4

## 2014-06-25 MED ORDER — ATORVASTATIN CALCIUM 10 MG PO TABS
10.0000 mg | ORAL_TABLET | Freq: Every day | ORAL | Status: DC
  Administered 2014-06-25: 10 mg via ORAL
  Filled 2014-06-25 (×2): qty 1

## 2014-06-25 MED ORDER — SODIUM CHLORIDE 0.9 % IV SOLN
INTRAVENOUS | Status: AC
  Administered 2014-06-25 – 2014-06-26 (×2): via INTRAVENOUS

## 2014-06-25 MED ORDER — AMLODIPINE BESYLATE 5 MG PO TABS
5.0000 mg | ORAL_TABLET | Freq: Every day | ORAL | Status: DC
  Administered 2014-06-25 – 2014-06-26 (×2): 5 mg via ORAL
  Filled 2014-06-25 (×2): qty 1

## 2014-06-25 MED ORDER — ACETAMINOPHEN 325 MG PO TABS: 650.0000 mg | ORAL_TABLET | Freq: Four times a day (QID) | ORAL | Status: DC | PRN

## 2014-06-25 MED ORDER — FLECAINIDE ACETATE 50 MG PO TABS
75.0000 mg | ORAL_TABLET | Freq: Two times a day (BID) | ORAL | Status: DC
  Administered 2014-06-25 – 2014-06-26 (×3): 75 mg via ORAL
  Filled 2014-06-25 (×4): qty 2

## 2014-06-25 MED ORDER — BELLADONNA ALKALOIDS-OPIUM 16.2-60 MG RE SUPP
1.0000 | Freq: Three times a day (TID) | RECTAL | Status: DC | PRN
Start: 2014-06-25 — End: 2014-06-26
  Administered 2014-06-25: 1 via RECTAL
  Filled 2014-06-25: qty 1

## 2014-06-25 MED ORDER — CETYLPYRIDINIUM CHLORIDE 0.05 % MT LIQD
7.0000 mL | Freq: Two times a day (BID) | OROMUCOSAL | Status: DC
  Administered 2014-06-25 – 2014-06-26 (×2): 7 mL via OROMUCOSAL

## 2014-06-25 MED ORDER — CARVEDILOL 6.25 MG PO TABS
6.2500 mg | ORAL_TABLET | Freq: Two times a day (BID) | ORAL | Status: DC
  Administered 2014-06-25 – 2014-06-26 (×3): 6.25 mg via ORAL
  Filled 2014-06-25 (×3): qty 1

## 2014-06-25 NOTE — ED Notes (Signed)
Report given to [facility] with [facility] [place].

## 2014-06-25 NOTE — Progress Notes (Signed)
ANTIBIOTIC CONSULT NOTE - INITIAL  Pharmacy Consult for zosyn Indication: Intra-abdominal Infection  Allergies  Allergen Reactions  . Codeine Nausea Only    Patient Measurements: Height: 6' (182.9 cm) Weight: 209 lb 14.1 oz (95.2 kg) IBW/kg (Calculated) : 77.6 Adjusted Body Weight:   Vital Signs: Temp: 97.9 F (36.6 C) (06/24 0245) Temp Source: Oral (06/24 0245) BP: 136/75 mmHg (06/24 0245) Pulse Rate: 69 (06/24 0245) Intake/Output from previous day:   Intake/Output from this shift:    Labs:  Recent Labs  06/24/14 2001  WBC 13.4*  HGB 14.8  PLT 132*  CREATININE 1.32*   Estimated Creatinine Clearance: 65.9 mL/min (by C-G formula based on Cr of 1.32). No results for input(s): VANCOTROUGH, VANCOPEAK, VANCORANDOM, GENTTROUGH, GENTPEAK, GENTRANDOM, TOBRATROUGH, TOBRAPEAK, TOBRARND, AMIKACINPEAK, AMIKACINTROU, AMIKACIN in the last 72 hours.   Microbiology: No results found for this or any previous visit (from the past 720 hour(s)).  Medical History: Past Medical History  Diagnosis Date  . Sleep apnea   . Palpitations   . Dyslipidemia     failed niaspan  . Bradycardia     treated with pacemaker placement- Dr. Virl Axe MD  . HTN (hypertension)   . PVCs (premature ventricular contractions)   . Pacemaker reprogramming/check   . PACEMAKER-[employer] 08/31/2008    Qualifier: Diagnosis of  By: Caryl Comes, MD, Remus Blake   . Bladder stone     Medications:  Anti-infectives    Start     Dose/Rate Route Frequency Ordered Stop   06/25/14 0600  piperacillin-tazobactam (ZOSYN) IVPB 3.375 g     3.375 g 12.5 mL/hr over 240 Minutes Intravenous 3 times per day 06/25/14 0540     06/24/14 2205  cefTRIAXone (ROCEPHIN) 1 G injection    Comments:  Oretha Caprice   : cabinet override      06/24/14 2205 06/24/14 2219   06/24/14 2200  cefTRIAXone (ROCEPHIN) 1 g in dextrose 5 % 50 mL IVPB     1 g 100 mL/hr over 30 Minutes Intravenous  Once 06/24/14 2159 06/24/14 2300      Assessment: Patient with Intra-abdominal Infection per MD and MD wishes for pharmacy to dose zosyn.  Goal of Therapy:  Zosyn based on renal function   Plan:  Follow up culture results  Zosyn 3.375g IV Q8H infused over 4hrs.   Tyler Deis, Shea Stakes Crowford 06/25/2014,5:54 AM

## 2014-06-25 NOTE — Progress Notes (Signed)
Patient ID: Patrick Weaver, male   DOB: March 10, 1945, 69 y.o.   MRN: [id number]    Subjective: Pt clinically improved.  Still with bladder spasms and penile pain.  Objective: Vital signs in last 24 hours: Temp:  [97.9 F (36.6 C)-101.8 F (38.8 C)] 98 F (36.7 C) (06/24 1423) Pulse Rate:  [69-90] 70 (06/24 1423) Resp:  [16-20] 20 (06/24 1423) BP: (119-139)/(49-75) 127/66 mmHg (06/24 1423) SpO2:  [91 %-99 %] 99 % (06/24 1423) Weight:  [90.719 kg (200 lb)-95.2 kg (209 lb 14.1 oz)] 95.2 kg (209 lb 14.1 oz) (06/24 0245)  Intake/Output from previous day: 06/23 0701 - 06/24 0700 In: 627.5 [I.V.:577.5; IV Piggyback:50] Out: 58 [Urine:410] Intake/Output this shift: Total I/O In: 890 [P.O.:240; I.V.:600; IV Piggyback:50] Out: 0488 [QBVQX:4503]  Physical Exam:  General: Alert and oriented GU: Urine clear  Lab Results:  Recent Labs  06/24/14 2001 06/25/14 0640  HGB 14.8 13.2  HCT 42.6 38.5*   BMET  Recent Labs  06/24/14 2001 06/25/14 0640  NA 136 137  K 3.7 3.8  CL 102 103  CO2 25 25  GLUCOSE 110* 111*  BUN 14 14  CREATININE 1.32* 1.21  CALCIUM 9.0 8.4*     Assessment/Plan: - From a GU standpoint, likely ok to discharge on oral antibiotics once afebrile for 24 hours.  While GU source is most likely etiology for fever and illness, his UA was not strongly convincing for clear UTI pending culture result.  I would leave his catheter in upon discharge to be safe, nonetheless, and I will arrange follow up next week for a voiding trial.  He will need 10-14 days of culture specific antibiotic therapy and I will also plan to move up his planned surgical procedure at his request.    LOS: 1 day   Josephine Rudnick,LES 06/25/2014, 5:37 PM

## 2014-06-25 NOTE — H&P (Signed)
[facility] History and Physical  JONTAE ADEBAYO MHD:[id number] DOB: 07-27-1945 DOA: 06/24/2014  Referring physician: Patient was transferred from [facility] [place]. PCP: Anthoney Harada, MD  Specialists: Dr. Alinda Money. Urologist.  Chief Complaint: Testicular pain and difficulty urinating.  HPI: Patrick Weaver is a 69 y.o. 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 Patient has been having these symptoms over the last 2 days. Patient also has some subjective feeling of fever and chills. Patient had cystoscopy done last week by Dr. Alinda Money, urologist for prostate enlargement and bladder stones. In the ER patient had CT renal stone studies which showed multiple stones in the urinary bladder. UA shows features consistent with UTI. Patient was mildly febrile. On-call urologist Dr. Noah Delaine was consulted and patient has been admitted for complex UTI. On exam patient denies any chest pain shortness of breath abdominal pain nausea vomiting or diarrhea.   Review of Systems: As presented in the history of presenting illness, rest negative.  Past Medical History  Diagnosis Date  . Sleep apnea   . Palpitations   . Dyslipidemia     failed niaspan  . Bradycardia     treated with pacemaker placement- Dr. Virl Axe MD  . HTN (hypertension)   . PVCs (premature ventricular contractions)   . Pacemaker reprogramming/check   . PACEMAKER-[employer]tr[employer] 08/31/2008    Qualifier: Diagnosis of  By: Caryl Comes, MD, Remus Blake   . Bladder stone    Past Surgical History  Procedure Laterality Date  . Permanent pacemaker  2009  . Toisillectomy     Social History:  reports that he has never smoked. He has never used smokeless tobacco. He reports that he does not drink alcohol or use illicit drugs. Where does  patient live home. Can patient participate in ADLs? Yes.  Allergies  Allergen Reactions  . Codeine Nausea Only    Family History:  Family History  Problem Relation Age of Onset  . Hypertension Mother   . Cancer Mother   . Hypertension Brother   . Heart failure Father     Hx of CHF. died of MI  . Heart disease Father   . Stomach cancer Neg Hx   . Esophageal cancer Neg Hx   . Colon cancer Maternal Grandfather       Prior to Admission medications   Medication Sig Start Date End Date Taking? Authorizing Provider  amLODipine (NORVASC) 5 MG tablet TAKE 1 TABLET BY MOUTH ONCE DAILY 03/02/14  Yes Deboraha Sprang, MD  atorvastatin (LIPITOR) 10 MG tablet TAKE 1 TABLET (10 MG TOTAL) BY MOUTH DAILY. 08/21/13  Yes Fay Records, MD  carvedilol (COREG) 6.25 MG tablet TAKE 1 TABLET BY MOUTH TWICE A DAY 01/27/14  Yes Deboraha Sprang, MD  flecainide [facility]) 150 MG tablet TAKE 1/2 TABLET 2 TIMES A DAY 09/18/13  Yes Deboraha Sprang, MD    Physical Exam: Filed Vitals:   06/24/14 2308 06/24/14 2330 06/25/14 0000 06/25/14 0245  BP: 127/64 129/61 119/67 136/75  Pulse: 90 70 70 69  Temp:    97.9 F (36.6 C)  TempSrc:    Oral  Resp: 20   18  Height:    6' (1.829 m)  Weight:    95.2 kg (209 lb 14.1 oz)  SpO2: 92% 93% 94% 98%     General:  Moderately built and nourished.  Eyes: Anicteric no pallor.  ENT: No discharge from the ears eyes nose and mouth.  Neck: No mass felt.  Cardiovascular: S1-S2 heard.  Respiratory: No rhonchi or crepitations.  Abdomen: Soft nontender bowel sounds present.  Skin: No rash.  Musculoskeletal: No edema.  Psychiatric: Appears normal.  Neurologic: Alert awake oriented to time place and person. Moves all extremities.  Labs on Admission:  Basic Metabolic Panel:  Recent Labs Lab 06/24/14 2001  NA 136  K 3.7  CL 102  CO2 25  GLUCOSE 110*  BUN 14  CREATININE 1.32*  CALCIUM 9.0   Liver Function Tests:  Recent Labs Lab 06/24/14 2001  AST 20   ALT 24  ALKPHOS 83  BILITOT 1.0  PROT 7.4  ALBUMIN 4.3   No results for input(s): LIPASE, AMYLASE in the last 168 hours. No results for input(s): AMMONIA in the last 168 hours. CBC:  Recent Labs Lab 06/24/14 2001  WBC 13.4*  NEUTROABS 11.3*  HGB 14.8  HCT 42.6  MCV 89.7  PLT 132*   Cardiac Enzymes:  Recent Labs Lab 06/24/14 2001  TROPONINI <0.03    BNP (last 3 results) No results for input(s): BNP in the last 8760 hours.  ProBNP (last 3 results) No results for input(s): PROBNP in the last 8760 hours.  CBG: No results for input(s): GLUCAP in the last 168 hours.  Radiological Exams on Admission: US Scrotum  06/24/2014   CLINICAL DATA:  Pain radiating down to scrotum. History of bladder stones.  EXAM: SCROTAL ULTRASOUND  DOPPLER ULTRASOUND OF THE TESTICLES  TECHNIQUE: Complete ultrasound examination of the testicles, epididymis, and other scrotal structures was performed. Color and spectral Doppler ultrasound were also utilized to evaluate blood flow to the testicles.  COMPARISON:  CT 06/24/2014  FINDINGS: Right testicle  Measurements: Normal in size and homogeneous echotexture measuring 4.4 x 2.0 x 3.5 cm.  Left testicle  Measurements: Normal in size and homogeneous echotexture measuring 5.0 x 2.0 x 3.0 cm.  Right epididymis:  Normal in size and appearance.  Left epididymis:  Normal in size and appearance.  Hydrocele:  Small bilateral hydroceles.  Varicocele:  None visualized.  Pulsed Doppler interrogation of both testes demonstrates normal low resistance arterial and venous waveforms bilaterally.  IMPRESSION: 1. Normal testicles with normal color Doppler and spectral waveforms. 2. Bilateral small hydroceles.   Electronically Signed   By: Suzy Bouchard M.D.   On: 06/24/2014 21:16   [place] Art/ven Flow Abd Pelv Doppler  06/24/2014   CLINICAL DATA:  Pain radiating down to scrotum. History of bladder stones.  EXAM: SCROTAL ULTRASOUND  DOPPLER ULTRASOUND OF THE TESTICLES   TECHNIQUE: Complete ultrasound examination of the testicles, epididymis, and other scrotal structures was performed. Color and spectral Doppler ultrasound were also utilized to evaluate blood flow to the testicles.  COMPARISON:  CT 06/24/2014  FINDINGS: Right testicle  Measurements: Normal in size and homogeneous echotexture measuring 4.4 x 2.0 x 3.5 cm.  Left testicle  Measurements: Normal in size and homogeneous echotexture measuring 5.0 x 2.0 x 3.0 cm.  Right epididymis:  Normal in size and appearance.  Left epididymis:  Normal in size and appearance.  Hydrocele:  Small bilateral hydroceles.  Varicocele:  None visualized.  Pulsed Doppler interrogation of both testes demonstrates normal low resistance arterial and venous waveforms bilaterally.  IMPRESSION: 1. Normal testicles with normal color Doppler and spectral waveforms. 2. Bilateral small hydroceles.   Electronically Signed   By: Suzy Bouchard  M.D.   On: 06/24/2014 21:16   Ct Renal Stone Study  06/24/2014   CLINICAL DATA:  Acute onset of penile and scrotal pain. Dysuria. Initial encounter.  EXAM: CT ABDOMEN AND PELVIS WITHOUT CONTRAST  TECHNIQUE: Multidetector CT imaging of the abdomen and pelvis was performed following the standard protocol without IV contrast.  COMPARISON:  Abdominal images from CTA of the chest performed 07/15/2012  FINDINGS: Mild right basilar atelectasis is noted. Trace pericardial fluid remains within normal limits. Pacemaker leads are partially imaged.  The liver and spleen are unremarkable in appearance. The gallbladder is within normal limits. The pancreas and adrenal glands are unremarkable.  Mild nonspecific perinephric stranding is noted bilaterally. A few left renal cysts are seen, measuring up to 2.5 cm in size. There is no evidence of hydronephrosis. No renal or ureteral stones are identified.  No free fluid is identified. The small bowel is unremarkable in appearance. The stomach is within normal limits. No acute  vascular abnormalities are seen.  The appendix is normal in caliber and contains trace air, without evidence of appendicitis. Minimal diverticulosis is noted along the distal descending and proximal sigmoid colon, without evidence of diverticulitis.  The bladder is mildly distended, with multiple stones noted dependently in the bladder. The prostate is markedly enlarged, measuring 7.2 cm in transverse dimension and 8.8 cm in craniocaudal dimension, with mass effect on the base of the bladder. No inguinal lymphadenopathy is seen.  No acute osseous abnormalities are identified.  IMPRESSION: 1. Multiple bladder stones again noted. The bladder is compressed by the patient's markedly enlarged prostate. 2. Markedly enlarged prostate noted, measuring 7.2 cm in transverse dimension and 8.8 cm in craniocaudal dimension. Would correlate with PSA, and consider further evaluation as deemed clinically appropriate. 3. Few small left renal cysts seen.  Kidneys otherwise unremarkable. 4. Minimal diverticulosis along the distal descending and proximal sigmoid colon, without evidence of diverticulitis. 5. Mild right basilar atelectasis noted.   Electronically Signed   By: Garald Balding M.D.   On: 06/24/2014 21:22     Assessment/Plan Principal Problem:   Complicated UTI (urinary tract infection) Active Problems:   Aneurysm of thoracic aorta   PACEMAKER-[employer]   Urinary tract infection   Testicular pain   Essential hypertension   1. Complicated UTI with bladder stones and enlarged prostate - I have discussed with Dr. Aline August on call urologist. At this time patient will be placed on Zosyn and follow urine cultures. Patient has had Foley catheter placed in the ER. Closely monitor intake output and further recommendations per urologist. Dr. Aline August advised that patient will not be having any procedure done at this moment and the patient's urine infection gets better. 2. Hypertension - continue home  medications. 3. 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 I have as pharmacy to dose the flecainide. 4. History of sinus node dysfunction status post pacemaker placement. 5. History of thoracic artery aneurysm presently has no chest pain. 6. Chronic kidney disease stage III - creatinine appears to be at baseline.   DVT Prophylaxis Lovenox.  Code Status: Full code.  Family Communication: Discussed with patient.  Disposition Plan: Admit to inpatient.    Ruie Sendejo N. [facility] Pager [phone].  If 7PM-7AM, please contact night-coverage [email or web address] Password [email or web address] 06/25/2014, 5:32 AM

## 2014-06-25 NOTE — Consult Note (Signed)
Urology Consult  Referring physician: Dr. Charlies Silvers Reason for referral: urinary retention, bladder calculi  Chief Complaint: suprapubic and penile pain  History of Present Illness: Patrick Weaver is a 69yo with a hx of BPH and incomplete emptying who presented to [facility] with a 2 day hx of dysuria, incomplete emptying, urgency, and fever. He has known bladder calculi and was scheduled to undergo cystolithalopaxy and TURP in 08/2014. Baseline he has severe LUTS and he had been passing numerous calculi. Currently he complains of dull, constant, nonradiating suprapubic pain and sharp, constant non radiating penile pain. Both pains are severe. He then had a foley placed which drained 300cc of urine. He currently has a fever of 102. WBC and creatinine are elevated.  Past Medical History  Diagnosis Date  . Sleep apnea   . Palpitations   . Dyslipidemia     failed niaspan  . Bradycardia     treated with pacemaker placement- Dr. Virl Axe MD  . HTN (hypertension)   . PVCs (premature ventricular contractions)   . Pacemaker reprogramming/check   . PACEMAKER-[employer] 08/31/2008    Qualifier: Diagnosis of  By: Caryl Comes, MD, Remus Blake   . Bladder stone    Past Surgical History  Procedure Laterality Date  . Permanent pacemaker  2009  . Toisillectomy      Medications: I have reviewed the patient's current medications. Allergies:  Allergies  Allergen Reactions  . Codeine Nausea Only    Family History  Problem Relation Age of Onset  . Hypertension Mother   . Cancer Mother   . Hypertension Brother   . Heart failure Father     Hx of CHF. died of MI  . Heart disease Father   . Stomach cancer Neg Hx   . Esophageal cancer Neg Hx   . Colon cancer Maternal Grandfather    Social History:  reports that he has never smoked. He has never used smokeless tobacco. He reports that he does not drink alcohol or use illicit drugs.  Review of Systems  Constitutional: Positive for fever  and chills.  Genitourinary: Positive for dysuria, urgency and frequency.  All other systems reviewed and are negative.   Physical Exam:  Vital signs in last 24 hours: Temp:  [97.9 F (36.6 C)-101.8 F (38.8 C)] 98.3 F (36.8 C) (06/24 0605) Pulse Rate:  [69-90] 70 (06/24 0605) Resp:  [16-20] 16 (06/24 0605) BP: (119-139)/(49-75) 139/67 mmHg (06/24 0605) SpO2:  [91 %-98 %] 97 % (06/24 0605) Weight:  [90.719 kg (200 lb)-95.2 kg (209 lb 14.1 oz)] 95.2 kg (209 lb 14.1 oz) (06/24 0245) Physical Exam  Constitutional: He is oriented to person, place, and time. He appears well-developed and well-nourished. No distress.  HENT:  Head: Normocephalic and atraumatic.  Right Ear: External ear normal.  Left Ear: External ear normal.  Eyes: EOM are normal. Pupils are equal, round, and reactive to light. Left eye exhibits no discharge.  Neck: Normal range of motion. Neck supple. No tracheal deviation present. No thyromegaly present.  Cardiovascular: Normal rate and regular rhythm.   Respiratory: Effort normal and breath sounds normal.  GI: Soft. He exhibits no distension and no mass. There is no tenderness. There is no rebound and no guarding. Hernia confirmed negative in the right inguinal area and confirmed negative in the left inguinal area.  Genitourinary: Testes normal and penis normal. Right testis shows no mass, no swelling and no tenderness. Left testis shows no mass, no swelling and no tenderness. No hypospadias  or penile tenderness.  Musculoskeletal: Normal range of motion.  Lymphadenopathy:       Right: No inguinal adenopathy present.       Left: No inguinal adenopathy present.  Neurological: He is alert and oriented to person, place, and time.  Skin: Skin is warm and dry.  Psychiatric: He has a normal mood and affect. His behavior is normal. Judgment and thought content normal.    Laboratory Data:  Results for orders placed or performed during the hospital encounter of 06/24/14 (from  the past 72 hour(s))  CBC with Differential/Platelet     Status: Abnormal   Collection Time: 06/24/14  8:01 PM  Result Value Ref Range   WBC 13.4 (H) 4.0 - 10.5 K/uL   RBC 4.75 4.22 - 5.81 MIL/uL   Hemoglobin 14.8 13.0 - 17.0 g/dL   HCT 42.6 39.0 - 52.0 %   MCV 89.7 78.0 - 100.0 fL   MCH 31.2 26.0 - 34.0 pg   MCHC 34.7 30.0 - 36.0 g/dL   RDW 13.2 11.5 - 15.5 %   Platelets 132 (L) 150 - 400 K/uL   Neutrophils Relative % 84 (H) 43 - 77 %   Neutro Abs 11.3 (H) 1.7 - 7.7 K/uL   Lymphocytes Relative 8 (L) 12 - 46 %   Lymphs Abs 1.0 0.7 - 4.0 K/uL   Monocytes Relative 7 3 - 12 %   Monocytes Absolute 0.9 0.1 - 1.0 K/uL   Eosinophils Relative 1 0 - 5 %   Eosinophils Absolute 0.2 0.0 - 0.7 K/uL   Basophils Relative 0 0 - 1 %   Basophils Absolute 0.0 0.0 - 0.1 K/uL  Comprehensive metabolic panel     Status: Abnormal   Collection Time: 06/24/14  8:01 PM  Result Value Ref Range   Sodium 136 135 - 145 mmol/L   Potassium 3.7 3.5 - 5.1 mmol/L   Chloride 102 101 - 111 mmol/L   CO2 25 22 - 32 mmol/L   Glucose, Bld 110 (H) 65 - 99 mg/dL   BUN 14 6 - 20 mg/dL   Creatinine, Ser 1.32 (H) 0.61 - 1.24 mg/dL   Calcium 9.0 8.9 - 10.3 mg/dL   Total Protein 7.4 6.5 - 8.1 g/dL   Albumin 4.3 3.5 - 5.0 g/dL   AST 20 15 - 41 U/L   ALT 24 17 - 63 U/L   Alkaline Phosphatase 83 38 - 126 U/L   Total Bilirubin 1.0 0.3 - 1.2 mg/dL   GFR calc non Af Amer 55 (L) >60 mL/min   GFR calc Af Amer >60 >60 mL/min    Comment: (NOTE) The eGFR has been calculated using the CKD EPI equation. This calculation has not been validated in all clinical situations. eGFR's persistently <60 mL/min signify possible Chronic Kidney Disease.    Anion gap 9 5 - 15  Troponin I     Status: None   Collection Time: 06/24/14  8:01 PM  Result Value Ref Range   Troponin I <0.03 <0.031 ng/mL    Comment:        NO INDICATION OF MYOCARDIAL INJURY.   I-Stat CG4 Lactic Acid, ED     Status: None   Collection Time: 06/24/14  8:03 PM   Result Value Ref Range   Lactic Acid, Venous 1.46 0.5 - 2.0 mmol/L  Urinalysis, Routine w reflex microscopic (not at [facility])     Status: Abnormal   Collection Time: 06/24/14  9:17 PM  Result Value Ref Range  Color, Urine YELLOW YELLOW   APPearance CLOUDY (A) CLEAR   Specific Gravity, Urine 1.020 1.005 - 1.030   pH 5.5 5.0 - 8.0   Glucose, UA NEGATIVE NEGATIVE mg/dL   Hgb urine dipstick NEGATIVE NEGATIVE   Bilirubin Urine NEGATIVE NEGATIVE   Ketones, ur NEGATIVE NEGATIVE mg/dL   Protein, ur 30 (A) NEGATIVE mg/dL   Urobilinogen, UA 0.2 0.0 - 1.0 mg/dL   Nitrite NEGATIVE NEGATIVE   Leukocytes, UA LARGE (A) NEGATIVE  Urine microscopic-add on     Status: Abnormal   Collection Time: 06/24/14  9:17 PM  Result Value Ref Range   Squamous Epithelial / LPF RARE RARE   WBC, UA 21-50 <3 WBC/hpf   RBC / HPF 0-2 <3 RBC/hpf   Bacteria, UA MANY (A) RARE   Urine-Other MUCOUS PRESENT   I-Stat CG4 Lactic Acid, ED     Status: None   Collection Time: 06/25/14 12:51 AM  Result Value Ref Range   Lactic Acid, Venous 0.83 0.5 - 2.0 mmol/L  Comprehensive metabolic panel     Status: Abnormal   Collection Time: 06/25/14  6:40 AM  Result Value Ref Range   Sodium 137 135 - 145 mmol/L   Potassium 3.8 3.5 - 5.1 mmol/L   Chloride 103 101 - 111 mmol/L   CO2 25 22 - 32 mmol/L   Glucose, Bld 111 (H) 65 - 99 mg/dL   BUN 14 6 - 20 mg/dL   Creatinine, Ser 1.21 0.61 - 1.24 mg/dL   Calcium 8.4 (L) 8.9 - 10.3 mg/dL   Total Protein 6.5 6.5 - 8.1 g/dL   Albumin 3.8 3.5 - 5.0 g/dL   AST 15 15 - 41 U/L   ALT 19 17 - 63 U/L   Alkaline Phosphatase 78 38 - 126 U/L   Total Bilirubin 0.9 0.3 - 1.2 mg/dL   GFR calc non Af Amer >60 >60 mL/min   GFR calc Af Amer >60 >60 mL/min    Comment: (NOTE) The eGFR has been calculated using the CKD EPI equation. This calculation has not been validated in all clinical situations. eGFR's persistently <60 mL/min signify possible Chronic Kidney Disease.    Anion gap 9 5 - 15  CBC  with Differential/Platelet     Status: Abnormal   Collection Time: 06/25/14  6:40 AM  Result Value Ref Range   WBC 9.0 4.0 - 10.5 K/uL   RBC 4.26 4.22 - 5.81 MIL/uL   Hemoglobin 13.2 13.0 - 17.0 g/dL   HCT 38.5 (L) 39.0 - 52.0 %   MCV 90.4 78.0 - 100.0 fL   MCH 31.0 26.0 - 34.0 pg   MCHC 34.3 30.0 - 36.0 g/dL   RDW 13.2 11.5 - 15.5 %   Platelets 116 (L) 150 - 400 K/uL    Comment: SPECIMEN CHECKED FOR CLOTS PLATELET COUNT CONFIRMED BY SMEAR    Neutrophils Relative % 80 (H) 43 - 77 %   Neutro Abs 7.2 1.7 - 7.7 K/uL   Lymphocytes Relative 10 (L) 12 - 46 %   Lymphs Abs 0.9 0.7 - 4.0 K/uL   Monocytes Relative 8 3 - 12 %   Monocytes Absolute 0.7 0.1 - 1.0 K/uL   Eosinophils Relative 1 0 - 5 %   Eosinophils Absolute 0.1 0.0 - 0.7 K/uL   Basophils Relative 0 0 - 1 %   Basophils Absolute 0.0 0.0 - 0.1 K/uL   No results found for this or any previous visit (from the past 240 hour(s)).  Creatinine:  Recent Labs  06/24/14 2001 06/25/14 0640  CREATININE 1.32* 1.21    Impression/Assessment:  UTI Bladder Calculi BPH with urinary retention  Plan:  Would recommend: 1. Broad Spectrum antibiotics 2. Foley to straight drain 3. Pt will require 2 weeks of antibiotics prior to his endoscopic procedure. Currently the pt is schedule for 08/2014 but he wishes to reschedule his surgery to sooner if possible. He will likely need chronic indwelling foley until his procedure  Please call with any additional questions  Patrick Weaver L 06/25/2014, 8:26 AM

## 2014-06-25 NOTE — Progress Notes (Addendum)
[facility] PROGRESS NOTE  JUDAS MOHAMMAD BPZ:[id number] DOB: 1945-02-27 DOA: 06/24/2014 PCP: Anthoney Harada, MD  Brief narrative:   Addendum to admission note done 06/25/2014 69 year old male who presented ot [facility] with 2 day history of dysuria, urgency, fevers. Pt has known bladder calculi and was scheduled to undergo cystolithalopaxy and TURP in 08/2014. He was admitted for UTI.   Assessment/Plan:    Principal Problem:   Complicated UTI (urinary tract infection) / Leukocytosis - Started on IV zosyn - Continue this abx - Follow up urine culture results - Per GU, needs 2 weeks of abx and likely foley for the same duration.   *pt admitted after midnight. For further details on HPI, A/P please refer to admission note.   DVT Prophylaxis  - Lovenox subQ ordered    Code Status: Full.  Family Communication:  plan of care discussed with the patient Disposition Plan: Home in next 1-2 days of pain controlled, if no fevers.   IV access:  Peripheral IV  Procedures and diagnostic studies:    US Scrotum 06/24/2014  1. Normal testicles with normal color Doppler and spectral waveforms. 2. Bilateral small hydroceles.     [place] Art/ven Flow Abd Pelv Doppler 06/24/2014  1. Normal testicles with normal color Doppler and spectral waveforms. 2. Bilateral small hydroceles.     Ct Renal Stone Study 06/24/2014  1. Multiple bladder stones again noted. The bladder is compressed by the patient's markedly enlarged prostate. 2. Markedly enlarged prostate noted, measuring 7.2 cm in transverse dimension and 8.8 cm in craniocaudal dimension. Would correlate with PSA, and consider further evaluation as deemed clinically appropriate. 3. Few small left renal cysts seen.  Kidneys otherwise unremarkable. 4. Minimal diverticulosis along the distal descending and proximal sigmoid colon, without evidence of diverticulitis. 5. Mild right basilar atelectasis noted.     Medical Consultants:  Urology  Other  Consultants:  None   IAnti-Infectives:   Zosyn 06/25/2014 -->   Leisa Lenz, MD  [facility] Pager [phone]  If 7PM-7AM, please contact night-coverage [email or web address] Password [email or web address] 06/25/2014, 11:08 AM   LOS: 1 day    HPI/Subjective: No acute overnight events. Patient reports pain 6/10 in intensity.   Objective: Filed Vitals:   06/24/14 2330 06/25/14 0000 06/25/14 0245 06/25/14 0605  BP: 129/61 119/67 136/75 139/67  Pulse: 70 70 69 70  Temp:   97.9 F (36.6 C) 98.3 F (36.8 C)  TempSrc:   Oral Oral  Resp:   18 16  Height:   6' (1.829 m)   Weight:   95.2 kg (209 lb 14.1 oz)   SpO2: 93% 94% 98% 97%    Intake/Output Summary (Last 24 hours) at 06/25/14 1108 Last data filed at 06/25/14 1443  Gross per 24 hour  Intake  867.5 ml  Output    410 ml  Net  457.5 ml    Exam:   General:  Pt is alert, follows commands appropriately, not in acute distress  Cardiovascular: Regular rate and rhythm, S1/S2, no murmurs  Respiratory: Clear to auscultation bilaterally, no wheezing, no crackles, no rhonchi  Data Reviewed: Basic Metabolic Panel:  Recent Labs Lab 06/24/14 2001 06/25/14 0640  NA 136 137  K 3.7 3.8  CL 102 103  CO2 25 25  GLUCOSE 110* 111*  BUN 14 14  CREATININE 1.32* 1.21  CALCIUM 9.0 8.4*   Liver Function Tests:  Recent Labs Lab 06/24/14 2001 06/25/14 0640  AST 20 15  ALT 24 19  ALKPHOS 83  78  BILITOT 1.0 0.9  PROT 7.4 6.5  ALBUMIN 4.3 3.8   No results for input(s): LIPASE, AMYLASE in the last 168 hours. No results for input(s): AMMONIA in the last 168 hours. CBC:  Recent Labs Lab 06/24/14 2001 06/25/14 0640  WBC 13.4* 9.0  NEUTROABS 11.3* 7.2  HGB 14.8 13.2  HCT 42.6 38.5*  MCV 89.7 90.4  PLT 132* 116*   Cardiac Enzymes:  Recent Labs Lab 06/24/14 2001  TROPONINI <0.03   BNP: Invalid input(s): POCBNP CBG: No results for input(s): GLUCAP in the last 168 hours.  No results found for this or any previous visit  (from the past 240 hour(s)).   Scheduled Meds: . amLODipine  5 mg Oral Daily  . antiseptic oral rinse  7 mL Mouth Rinse q12n4p  . atorvastatin  10 mg Oral q1800  . carvedilol  6.25 mg Oral BID  . chlorhexidine  15 mL Mouth Rinse BID  . enoxaparin (LOVENOX) injection  40 mg Subcutaneous Q24H  . flecainide  75 mg Oral Q12H  . piperacillin-tazobactam (ZOSYN)  IV  3.375 g Intravenous 3 times per day  . [DISCONTINUED] sodium chloride   Intravenous STAT   Continuous Infusions: . sodium chloride 75 mL/hr at 06/25/14 0550

## 2014-06-25 NOTE — ED Notes (Signed)
Patient resting comfortably in bed with eyes closed.  Wife has went home.  Patient awaiting transport to [facility]; [facility] en route.  Equal rise and fall of chest noted; no respiratory distress noted.

## 2014-06-26 DIAGNOSIS — I10 Essential (primary) hypertension: Secondary | ICD-10-CM

## 2014-06-26 DIAGNOSIS — I5032 Chronic diastolic (congestive) heart failure: Secondary | ICD-10-CM

## 2014-06-26 DIAGNOSIS — Z950 Presence of cardiac pacemaker: Secondary | ICD-10-CM

## 2014-06-26 LAB — URINE CULTURE: Culture: 9000

## 2014-06-26 MED ORDER — SULFAMETHOXAZOLE-TRIMETHOPRIM 800-160 MG PO TABS: 1.0000 | ORAL_TABLET | Freq: Two times a day (BID) | ORAL | Status: DC

## 2014-06-26 MED ORDER — ACETAMINOPHEN 325 MG PO TABS: 650.0000 mg | ORAL_TABLET | Freq: Four times a day (QID) | ORAL | Status: AC | PRN

## 2014-06-26 NOTE — Progress Notes (Addendum)
Foley education handout given and reviewed with pt. Pt verbalized understanding of foley care.

## 2014-06-26 NOTE — Discharge Summary (Signed)
Physician Discharge Summary  Patrick Weaver MHW:[id number] DOB: 1945-11-19 DOA: 06/24/2014  PCP: Anthoney Harada, MD  Admit date: 06/24/2014 Discharge date: 06/26/2014  Recommendations for Outpatient Follow-up:  1. Please continue bactrim for urinary tract infection and please follow up with urology prior to finishing antibiotic if you should continue taking beyond 2 weeks  Discharge Diagnoses:  Principal Problem:   Complicated UTI (urinary tract infection) Active Problems:   Aneurysm of thoracic aorta   PACEMAKER-[employer]   Urinary tract infection   Testicular pain   Essential hypertension   Leukocytosis    Discharge Condition: stable   Diet recommendation: as tolerated   History of present illness:  69 year old male with past medical history of hypertension, pacemaker, dyslipidemia who presented ot [facility] with 2 day history of dysuria, urgency, fevers. Pt has known bladder calculi and was scheduled to undergo cystolithalopaxy and TURP in 08/2014. He was admitted for UTI.  Hospital Course:   Assessment/Plan:    Principal Problem:  Complicated UTI (urinary tract infection) / Leukocytosis - Started on IV zosyn - Urine culture with insignificant growth - Blood cultures show no growth - Bactrim for 2 weeks on discharge - Continue foley catheter   Active Problems: Hypertension - Continue Norvasc  Chronic diastolic CHF - Last 2 D ECHO in 2015 with grade 1 diastolic dysfunction, EF 59% - Compensated  - Has pacemaker - Continue coreg 6.25 mg PO BID - Continue flecainide  Dyslipidemia - Continue statin therapy    Signed:  Leisa Lenz, MD  [facility] 06/26/2014, 12:18 PM  Pager #: [phone]  Time spent in minutes: more than 30 minutes  Procedures:  Catheter placement   Consultations:  Urology, Dr. Raynelle Bring   Discharge Exam: Filed Vitals:   06/26/14 0528  BP: 133/76  Pulse: 72  Temp: 97.8 F (36.6 C)  Resp: 18   Filed  Vitals:   06/25/14 0605 06/25/14 1423 06/25/14 2138 06/26/14 0528  BP: 139/67 127/66 122/57 133/76  Pulse: 70 70 69 72  Temp: 98.3 F (36.8 C) 98 F (36.7 C) 98.1 F (36.7 C) 97.8 F (36.6 C)  TempSrc: Oral Oral Oral Axillary  Resp: 16 20 18 18   Height:      Weight:      SpO2: 97% 99% 96% 100%    General: Pt is alert, follows commands appropriately, not in acute distress Cardiovascular: Regular rate and rhythm, S1/S2 +, no murmurs Respiratory: Clear to auscultation bilaterally, no wheezing, no crackles, no rhonchi Abdominal: Soft, non tender, non distended, bowel sounds +, no guarding Extremities: no edema, no cyanosis, pulses palpable bilaterally DP and PT Neuro: Grossly nonfocal  Discharge Instructions  Discharge Instructions    Call MD for:  difficulty breathing, headache or visual disturbances    Complete by:  As directed      Call MD for:  persistant nausea and vomiting    Complete by:  As directed      Call MD for:  severe uncontrolled pain    Complete by:  As directed      Diet - low sodium heart healthy    Complete by:  As directed      Discharge instructions    Complete by:  As directed   1. Please continue bactrim for urinary tract infection and please follow up with urology prior to finishing antibiotic if you should continue taking beyond 2 weeks     Increase activity slowly    Complete by:  As directed  Medication List    TAKE these medications        acetaminophen 325 MG tablet  Commonly known as:  TYLENOL  Take 2 tablets (650 mg total) by mouth every 6 (six) hours as needed for mild pain (or Fever >/= 101).     amLODipine 5 MG tablet  Commonly known as:  NORVASC  TAKE 1 TABLET BY MOUTH ONCE DAILY     atorvastatin 10 MG tablet  Commonly known as:  LIPITOR  TAKE 1 TABLET (10 MG TOTAL) BY MOUTH DAILY.     carvedilol 6.25 MG tablet  Commonly known as:  COREG  TAKE 1 TABLET BY MOUTH TWICE A DAY     flecainide 150 MG tablet  Commonly  known as:  TAMBOCOR  TAKE 1/2 TABLET 2 TIMES A DAY     sulfamethoxazole-trimethoprim 800-160 MG per tablet  Commonly known as:  BACTRIM DS,SEPTRA DS  Take 1 tablet by mouth 2 (two) times daily.           Follow-up Information    Follow up with Anthoney Harada, MD. Schedule an appointment as soon as possible for a visit in 1 week.   Specialty:  Family Medicine   Why:  Follow up appt after recent hospitalization   Contact information:   [place] [address] [phone]       Follow up with Dutch Gray, MD. Schedule an appointment as soon as possible for a visit in 2 weeks.   Specialty:  Urology   Why:  Follow up appt after recent hospitalization   Contact information:   [address] [place] [address] [phone]        The results of significant diagnostics from this hospitalization (including imaging, microbiology, ancillary and laboratory) are listed below for reference.    Significant Diagnostic Studies: US Scrotum  06/24/2014   CLINICAL DATA:  Pain radiating down to scrotum. History of bladder stones.  EXAM: SCROTAL ULTRASOUND  DOPPLER ULTRASOUND OF THE TESTICLES  TECHNIQUE: Complete ultrasound examination of the testicles, epididymis, and other scrotal structures was performed. Color and spectral Doppler ultrasound were also utilized to evaluate blood flow to the testicles.  COMPARISON:  CT 06/24/2014  FINDINGS: Right testicle  Measurements: Normal in size and homogeneous echotexture measuring 4.4 x 2.0 x 3.5 cm.  Left testicle  Measurements: Normal in size and homogeneous echotexture measuring 5.0 x 2.0 x 3.0 cm.  Right epididymis:  Normal in size and appearance.  Left epididymis:  Normal in size and appearance.  Hydrocele:  Small bilateral hydroceles.  Varicocele:  None visualized.  Pulsed Doppler interrogation of both testes demonstrates normal low resistance arterial and venous waveforms bilaterally.  IMPRESSION: 1. Normal testicles with normal  color Doppler and spectral waveforms. 2. Bilateral small hydroceles.   Electronically Signed   By: Suzy Bouchard M.D.   On: 06/24/2014 21:16   [place] Art/ven Flow Abd Pelv Doppler  06/24/2014   CLINICAL DATA:  Pain radiating down to scrotum. History of bladder stones.  EXAM: SCROTAL ULTRASOUND  DOPPLER ULTRASOUND OF THE TESTICLES  TECHNIQUE: Complete ultrasound examination of the testicles, epididymis, and other scrotal structures was performed. Color and spectral Doppler ultrasound were also utilized to evaluate blood flow to the testicles.  COMPARISON:  CT 06/24/2014  FINDINGS: Right testicle  Measurements: Normal in size and homogeneous echotexture measuring 4.4 x 2.0 x 3.5 cm.  Left testicle  Measurements: Normal in size and homogeneous echotexture measuring 5.0 x 2.0 x 3.0 cm.  Right epididymis:  Normal in size and appearance.  Left epididymis:  Normal in size and appearance.  Hydrocele:  Small bilateral hydroceles.  Varicocele:  None visualized.  Pulsed Doppler interrogation of both testes demonstrates normal low resistance arterial and venous waveforms bilaterally.  IMPRESSION: 1. Normal testicles with normal color Doppler and spectral waveforms. 2. Bilateral small hydroceles.   Electronically Signed   By: Suzy Bouchard M.D.   On: 06/24/2014 21:16   Ct Renal Stone Study  06/24/2014   CLINICAL DATA:  Acute onset of penile and scrotal pain. Dysuria. Initial encounter.  EXAM: CT ABDOMEN AND PELVIS WITHOUT CONTRAST  TECHNIQUE: Multidetector CT imaging of the abdomen and pelvis was performed following the standard protocol without IV contrast.  COMPARISON:  Abdominal images from CTA of the chest performed 07/15/2012  FINDINGS: Mild right basilar atelectasis is noted. Trace pericardial fluid remains within normal limits. Pacemaker leads are partially imaged.  The liver and spleen are unremarkable in appearance. The gallbladder is within normal limits. The pancreas and adrenal glands are unremarkable.  Mild  nonspecific perinephric stranding is noted bilaterally. A few left renal cysts are seen, measuring up to 2.5 cm in size. There is no evidence of hydronephrosis. No renal or ureteral stones are identified.  No free fluid is identified. The small bowel is unremarkable in appearance. The stomach is within normal limits. No acute vascular abnormalities are seen.  The appendix is normal in caliber and contains trace air, without evidence of appendicitis. Minimal diverticulosis is noted along the distal descending and proximal sigmoid colon, without evidence of diverticulitis.  The bladder is mildly distended, with multiple stones noted dependently in the bladder. The prostate is markedly enlarged, measuring 7.2 cm in transverse dimension and 8.8 cm in craniocaudal dimension, with mass effect on the base of the bladder. No inguinal lymphadenopathy is seen.  No acute osseous abnormalities are identified.  IMPRESSION: 1. Multiple bladder stones again noted. The bladder is compressed by the patient's markedly enlarged prostate. 2. Markedly enlarged prostate noted, measuring 7.2 cm in transverse dimension and 8.8 cm in craniocaudal dimension. Would correlate with PSA, and consider further evaluation as deemed clinically appropriate. 3. Few small left renal cysts seen.  Kidneys otherwise unremarkable. 4. Minimal diverticulosis along the distal descending and proximal sigmoid colon, without evidence of diverticulitis. 5. Mild right basilar atelectasis noted.   Electronically Signed   By: Garald Balding M.D.   On: 06/24/2014 21:22    Microbiology: Recent Results (from the past 240 hour(s))  Blood culture (routine x 2)     Status: None (Preliminary result)   Collection Time: 06/24/14  8:20 PM  Result Value Ref Range Status   Specimen Description BLOOD RIGHT HAND  Final   Special Requests BOTTLES DRAWN AEROBIC AND ANAEROBIC 5CC EACH  Final   Culture   Final    NO GROWTH < 24 HOURS Performed at [facility]     Report Status PENDING  Incomplete  Blood culture (routine x 2)     Status: None (Preliminary result)   Collection Time: 06/24/14  8:25 PM  Result Value Ref Range Status   Specimen Description BLOOD LEFT HAND  Final   Special Requests BOTTLES DRAWN AEROBIC AND ANAEROBIC 5CC EACH  Final   Culture   Final    NO GROWTH < 24 HOURS Performed at [facility]    Report Status PENDING  Incomplete  Urine culture     Status: None   Collection Time: 06/24/14  9:17 PM  Result Value Ref Range Status   Specimen Description URINE, CLEAN CATCH  Final   Special Requests NONE  Final   Culture   Final    9,000 COLONIES/mL INSIGNIFICANT GROWTH Performed at [facility]    Report Status 06/26/2014 FINAL  Final     Labs: Basic Metabolic Panel:  Recent Labs Lab 06/24/14 2001 06/25/14 0640  NA 136 137  K 3.7 3.8  CL 102 103  CO2 25 25  GLUCOSE 110* 111*  BUN 14 14  CREATININE 1.32* 1.21  CALCIUM 9.0 8.4*   Liver Function Tests:  Recent Labs Lab 06/24/14 2001 06/25/14 0640  AST 20 15  ALT 24 19  ALKPHOS 83 78  BILITOT 1.0 0.9  PROT 7.4 6.5  ALBUMIN 4.3 3.8   No results for input(s): LIPASE, AMYLASE in the last 168 hours. No results for input(s): AMMONIA in the last 168 hours. CBC:  Recent Labs Lab 06/24/14 2001 06/25/14 0640  WBC 13.4* 9.0  NEUTROABS 11.3* 7.2  HGB 14.8 13.2  HCT 42.6 38.5*  MCV 89.7 90.4  PLT 132* 116*   Cardiac Enzymes:  Recent Labs Lab 06/24/14 2001  TROPONINI <0.03   BNP: BNP (last 3 results) No results for input(s): BNP in the last 8760 hours.  ProBNP (last 3 results) No results for input(s): PROBNP in the last 8760 hours.  CBG: No results for input(s): GLUCAP in the last 168 hours.

## 2014-06-26 NOTE — Discharge Instructions (Signed)

## 2014-06-29 LAB — CULTURE, BLOOD (ROUTINE X 2)
CULTURE: NO GROWTH
CULTURE: NO GROWTH

## 2014-06-30 ENCOUNTER — Encounter: Payer: Self-pay | Admitting: Cardiology

## 2014-06-30 DIAGNOSIS — R3916 Straining to void: Secondary | ICD-10-CM | POA: Diagnosis not present

## 2014-06-30 DIAGNOSIS — N210 Calculus in bladder: Secondary | ICD-10-CM | POA: Diagnosis not present

## 2014-06-30 DIAGNOSIS — N401 Enlarged prostate with lower urinary tract symptoms: Secondary | ICD-10-CM | POA: Diagnosis not present

## 2014-06-30 DIAGNOSIS — R339 Retention of urine, unspecified: Secondary | ICD-10-CM | POA: Diagnosis not present

## 2014-07-07 NOTE — Patient Instructions (Addendum)
YOUR PROCEDURE IS SCHEDULED ON : 07/12/14  REPORT TO [facility] MAIN ENTRANCE FOLLOW SIGNS TO EAST ELEVATOR - GO TO 3rd FLOOR CHECK IN AT 3 EAST NURSES STATION (SHORT STAY) AT: 2:15 pm  CALL THIS NUMBER IF YOU HAVE PROBLEMS THE MORNING OF SURGERY [phone]  REMEMBER:ONLY 1 PER PERSON MAY GO TO SHORT STAY WITH YOU TO GET READY THE MORNING OF YOUR SURGERY  DO NOT EAT FOOD  AFTER MIDNIGHT  MAY HAVE CLEAR LIQUIDS UNTIL 10:15 AM  TAKE THESE MEDICINES THE MORNING OF SURGERY: AMLODIPINE / LIPITOR / CARVEDILOL / FLECAINIDE / FLOMAX  BRING C PAP MASK AND TUBING TO HOSPITAL     CLEAR LIQUID DIET   Foods Allowed                                                                     Foods Excluded  Coffee and tea, regular and decaf                             liquids that you cannot  Plain Jell-O in any flavor                                             see through such as: Fruit ices (not with fruit pulp)                                     milk, soups, orange juice  Iced Popsicles                                                 All solid food Carbonated beverages, regular and diet                                    Cranberry, grape and apple juices Sports drinks like Gatorade Lightly seasoned clear broth or consume(fat free) Sugar, honey syrup  _____________________________________________________________________    YOU MAY NOT HAVE ANY METAL ON YOUR BODY INCLUDING HAIR PINS AND PIERCING'S. DO NOT WEAR JEWELRY, MAKEUP, LOTIONS, POWDERS OR PERFUMES. DO NOT WEAR NAIL POLISH. DO NOT SHAVE 48 HRS PRIOR TO SURGERY. MEN MAY SHAVE FACE AND NECK.  DO NOT [facility]. [facility] IS NOT RESPONSIBLE FOR VALUABLES.  CONTACTS, DENTURES OR PARTIALS MAY NOT BE WORN TO SURGERY. LEAVE SUITCASE IN CAR. CAN BE BROUGHT TO ROOM AFTER SURGERY.  PATIENTS DISCHARGED THE DAY OF SURGERY WILL NOT BE ALLOWED TO DRIVE HOME.  PLEASE READ OVER THE FOLLOWING INSTRUCTION  SHEETS _________________________________________________________________________________                                           - PREPARING  FOR SURGERY  Before surgery, you can play an important role.  Because skin is not sterile, your skin needs to be as free of germs as possible.  You can reduce the number of germs on your skin by washing with CHG (chlorahexidine gluconate) soap before surgery.  CHG is an antiseptic cleaner which kills germs and bonds with the skin to continue killing germs even after washing. Please DO NOT use if you have an allergy to CHG or antibacterial soaps.  If your skin becomes reddened/irritated stop using the CHG and inform your nurse when you arrive at Short Stay. Do not shave (including legs and underarms) for at least 48 hours prior to the first CHG shower.  You may shave your face. Please follow these instructions carefully:   1.  Shower with CHG Soap the night before surgery and the  morning of Surgery.   2.  If you choose to wash your hair, wash your hair first as usual with your  normal  Shampoo.   3.  After you shampoo, rinse your hair and body thoroughly to remove the  shampoo.                                         4.  Use CHG as you would any other liquid soap.  You can apply chg directly  to the skin and wash . Gently wash with scrungie or clean wascloth    5.  Apply the CHG Soap to your body ONLY FROM THE NECK DOWN.   Do not use on open                           Wound or open sores. Avoid contact with eyes, ears mouth and genitals (private parts).                        Genitals (private parts) with your normal soap.              6.  Wash thoroughly, paying special attention to the area where your surgery  will be performed.   7.  Thoroughly rinse your body with warm water from the neck down.   8.  DO NOT shower/wash with your normal soap after using and rinsing off  the CHG Soap .                9.  Pat yourself dry with a clean  towel.             10.  Wear clean night clothes to bed after shower             11.  Place clean sheets on your bed the night of your first shower and do not  sleep with pets.  Day of Surgery : Do not apply any lotions/deodorants the morning of surgery.  Please wear clean clothes to the hospital/surgery center.  FAILURE TO FOLLOW THESE INSTRUCTIONS MAY RESULT IN THE CANCELLATION OF YOUR SURGERY    PATIENT SIGNATURE_________________________________  ______________________________________________________________________

## 2014-07-08 ENCOUNTER — Ambulatory Visit (HOSPITAL_COMMUNITY)
Admission: RE | Admit: 2014-07-08 | Discharge: 2014-07-08 | Disposition: A | Discharge status: Home / Self Care | Payer: 59 | Source: Ambulatory Visit | Attending: Urology | Admitting: Urology

## 2014-07-08 ENCOUNTER — Other Ambulatory Visit: Payer: Self-pay | Admitting: Urology

## 2014-07-08 ENCOUNTER — Encounter (HOSPITAL_COMMUNITY)
Admission: RE | Admit: 2014-07-08 | Discharge: 2014-07-08 | Disposition: A | Discharge status: Home / Self Care | Payer: 59 | Source: Ambulatory Visit | Attending: Urology | Admitting: Urology

## 2014-07-08 ENCOUNTER — Encounter (HOSPITAL_COMMUNITY): Payer: Self-pay

## 2014-07-08 DIAGNOSIS — I10 Essential (primary) hypertension: Secondary | ICD-10-CM

## 2014-07-08 DIAGNOSIS — I517 Cardiomegaly: Secondary | ICD-10-CM | POA: Insufficient documentation

## 2014-07-08 HISTORY — DX: Benign prostatic hyperplasia without lower urinary tract symptoms: N40.0

## 2014-07-08 LAB — BASIC METABOLIC PANEL
ANION GAP: 9 (ref 5–15)
BUN: 18 mg/dL (ref 6–20)
CO2: 23 mmol/L (ref 22–32)
Calcium: 9.3 mg/dL (ref 8.9–10.3)
Chloride: 107 mmol/L (ref 101–111)
Creatinine, Ser: 1.18 mg/dL (ref 0.61–1.24)
Glucose, Bld: 106 mg/dL — ABNORMAL HIGH (ref 65–99)
POTASSIUM: 4.1 mmol/L (ref 3.5–5.1)
Sodium: 139 mmol/L (ref 135–145)

## 2014-07-08 LAB — CBC
HCT: 39.6 % (ref 39.0–52.0)
HEMOGLOBIN: 13.4 g/dL (ref 13.0–17.0)
MCH: 29.9 pg (ref 26.0–34.0)
MCHC: 33.8 g/dL (ref 30.0–36.0)
MCV: 88.4 fL (ref 78.0–100.0)
Platelets: 158 10*3/uL (ref 150–400)
RBC: 4.48 MIL/uL (ref 4.22–5.81)
RDW: 13.2 % (ref 11.5–15.5)
WBC: 5.7 10*3/uL (ref 4.0–10.5)

## 2014-07-08 NOTE — H&P (Signed)
History of Present Illness Patrick Weaver is a 69 year old with the following urologic history:    1) Elevated PSA: He was initially evaluated in November 2011 and had been noted to have an increasing PSA which had been as high as 4.2 and was steadily increasing over the prior few years. His PSA at his initial evaluation was 3.45. His grandfather and uncle had prostate cancer with his uncle having died of metastatic prostate cancer. He underwent a prostate biopsy in January 2011 which demonstrated BPH and focal chronic inflammation.    Jan 2012: 12 core biopsy - Focal chronic inflammation, Vol 138 cc    2) BPH/LUTS: His baseline symptoms include a sense of incomplete emptying, weak stream, urgency, and nocturia. Baseline IPSS was 24. His prostate volume is 138 cc measured by TRUS. He began combination medical therapy for BPH in January 2012 but stopped finasteride in October 2013 due to breast swelling.  Current treatment: tamsulosin    Interval history:    He presents today after having seen Jiles Crocker last week with complaints of worsening lower urinary tract symptoms. A KUB x-ray demonstrated possible bladder calculi. He follows up today for further evaluation. He states that he has since passed about 8 small stones. Currently, his symptoms are significantly improved. He also restarted tamsulosin the liver feels this has had minimal impact on his overall symptoms. He currently denies any hematuria.   Past Medical History Problems  1. History of Benign prostatic hypertrophy without lower urinary tract symptoms (N40.0) 2. History of Cardiac Devices Pacemaker Present 3. History of aortic aneurysm (Z86.79) 4. History of sinus bradycardia (Z86.79) 5. History of sleep apnea (Z87.09) 6. History of Skin Cancer  Surgical History Problems  1. History of Biopsy Skin 2. History of Pacemaker Placement 3. History of Tonsillectomy  Current Meds 1. Advil TABS; prn;  Therapy:  (Recorded:30Jan2013) to Recorded 2. AmLODIPine Besylate 5 MG Oral Tablet;  Therapy: [id number] to Recorded 3. Atorvastatin Calcium 10 MG Oral Tablet;  Therapy: [id number] to Recorded 4. Carvedilol 6.25 MG Oral Tablet;  Therapy: (Recorded:30Nov2011) to Recorded 5. CPAP;  Therapy: (Recorded:30Nov2011) to Recorded 6. Flecainide Acetate TABS;  Therapy: (Recorded:30Nov2011) to Recorded 7. Losartan Potassium 50 MG Oral Tablet;  Therapy: (Recorded:30Nov2011) to Recorded 8. Meloxicam TABS;  Therapy: (Recorded:31Jul2013) to Recorded 9. Tamsulosin HCl - 0.4 MG Oral Capsule; 1HS - TAKE ONE CAPSULE BY MOUTH AT  BEDTIME;  Therapy: [id number] to (Evaluate:26Sep2016)  Requested for: 02Oct2015; Last  Rx:02Oct2015 Ordered 10. Tylenol TABS; prn;   Therapy: (Recorded:30Jan2013) to Recorded  Allergies Medication  1. Codeine Derivatives  Family History Problems  1. Family history of Heart Disease : Father 2. Family history of Prostate Cancer : Maternal Grandfather 3. Family history of Prostate Cancer : Maternal Uncle  Social History Problems  1. Alcohol Use 2. Former smoker [phone]) 3. Marital History - Currently Married 4. Occupation:   Dealer  Review of Systems  Genitourinary: no hematuria.  Constitutional: no fever and no recent weight loss.  Cardiovascular: no leg swelling.    Vitals Vital Signs [Data Includes: Last 1 Day]  Recorded: [id number] 08:11AM  Blood Pressure: 124 / 79 Temperature: 97.7 F Heart Rate: 87  Physical Exam Constitutional: Well nourished and well developed . No acute distress.  ENT:. The ears and nose are normal in appearance.  Neck: The appearance of the neck is normal and no neck mass is present.  Pulmonary: No respiratory distress, normal respiratory rhythm and effort and clear bilateral breath sounds.  Cardiovascular:  Heart rate and rhythm are normal . No peripheral edema.  Skin: Normal skin turgor, no visible rash and no visible skin lesions.   Neuro/Psych:. Mood and affect are appropriate.    Results/Data Urine [Data Includes: Last 1 Day]   [id number]  COLOR GREEN   APPEARANCE CLEAR   SPECIFIC GRAVITY 1.025   pH 5.5   GLUCOSE NEG mg/dL  BILIRUBIN NEG   KETONE NEG mg/dL  BLOOD NEG   PROTEIN NEG mg/dL  UROBILINOGEN 0.2 mg/dL  NITRITE NEG   LEUKOCYTE ESTERASE NEG    Procedure  Procedure: Cystoscopy   Indication: Lower Urinary Tract Symptoms. Possible bladder calculi.  Informed Consent: Risks, benefits, and potential adverse events were discussed and informed consent was obtained from the patient.  Prep: The patient was prepped with betadine.  Anesthesia:. Local anesthesia was administered intraurethrally with 2% lidocaine jelly.  Antibiotic prophylaxis: Ciprofloxacin.  Procedure Note:  Urethral meatus:. No abnormalities.  Anterior urethra: No abnormalities.  Prostatic urethra:. He does have a very large prostate with coapting lateral lobes. However, he appears to have a very large anterior intravesical lobe that appears to be the primary source of his obstruction.  Bladder: Visulization was clear. The ureteral orifices were in the normal anatomic position bilaterally and had clear efflux of urine. Systematic examination of the bladder reveals some mild to moderate trabeculation throughout the bladder. There are multiple small bladder calculi with the largest measuring approximately 1 cm. The patient tolerated the procedure well.  Complications: None.    Assessment Assessed  1. Bladder calculus (N21.0) 2. Benign prostatic hyperplasia (BPH) with straining on urination (N40.1,R39.16)  Plan Health Maintenance  1. UA With REFLEX; [Do Not Release]; Status:Complete;   Done: [id number] 01:34PM  Discussion/Summary 1. BPH/LUTS/bladder calculi: He appears to have a large intravesical component of his BPH appears to be the primary source of his obstruction likely leading to incomplete emptying and development of bladder  calculi. We discussed these findings today and I recommended that he strongly consider proceeding with removal of his bladder stones endoscopically along with transurethral resection of his intravesical lobe. He has not responded well to medical therapy which is not surprising understanding his anatomy. We discussed options and alternatives. He does wish to proceed with cystoscopy and transurethral resection of the prostate along with removal of his bladder calculi. We have reviewed this procedure in detail and discussed the potential risks/complications including the risks of urinary incontinence, erectile dysfunction, retrograde ejaculation, bladder neck contracture, urethral stricture, failure of the procedure to improve his symptoms, the possibility of regrowth of prostate tissue, etc. He will need his pacemaker interrogated postoperatively. He will stop meloxicam prior to surgery. He would like to schedule this following his vacation later this summer.    2. Elevated PSA: This will be rechecked in the fall and his scheduled appointment in October. This will likely help to reestablish his new baseline.    Cc: Dr. Melinda Crutch     Signatures Electronically signed by : Raynelle Bring, M.D.; Jun 18 2014  5:24PM EST

## 2014-07-08 NOTE — Progress Notes (Signed)
[employer] REP notified of Dr. Lynne Logan request for pacer interrogation post op - she said to page her when pt is finished surgery.

## 2014-07-09 NOTE — Progress Notes (Signed)
Per Dr Alinda Money office, Chastity that Dr Alinda Money requesting pacemaker rep to be present pre and post op. I spoke with Dannial Monarch, [employer] rep @ [phone] and informed her of this.  She stated to call her pre op. Chastity at Dr Laney Pastor notified that contact had been done

## 2014-07-12 ENCOUNTER — Ambulatory Visit (HOSPITAL_COMMUNITY): Payer: 59 | Admitting: Anesthesiology

## 2014-07-12 ENCOUNTER — Encounter (HOSPITAL_COMMUNITY)
Admission: AD | Disposition: A | Discharge status: Home / Self Care | Payer: Self-pay | Source: Ambulatory Visit | Attending: Urology

## 2014-07-12 ENCOUNTER — Encounter (HOSPITAL_COMMUNITY): Payer: Self-pay | Admitting: Anesthesiology

## 2014-07-12 ENCOUNTER — Inpatient Hospital Stay (HOSPITAL_COMMUNITY)
Admission: AD | Admit: 2014-07-12 | Discharge: 2014-07-14 | DRG: 713 | Disposition: A | Discharge status: Home / Self Care | Payer: 59 | Source: Ambulatory Visit | Attending: Urology | Admitting: Urology

## 2014-07-12 DIAGNOSIS — Z79899 Other long term (current) drug therapy: Secondary | ICD-10-CM

## 2014-07-12 DIAGNOSIS — Z87891 Personal history of nicotine dependence: Secondary | ICD-10-CM

## 2014-07-12 DIAGNOSIS — Z9889 Other specified postprocedural states: Secondary | ICD-10-CM

## 2014-07-12 DIAGNOSIS — N401 Enlarged prostate with lower urinary tract symptoms: Secondary | ICD-10-CM | POA: Diagnosis not present

## 2014-07-12 DIAGNOSIS — G4730 Sleep apnea, unspecified: Secondary | ICD-10-CM | POA: Diagnosis present

## 2014-07-12 DIAGNOSIS — Z950 Presence of cardiac pacemaker: Secondary | ICD-10-CM

## 2014-07-12 DIAGNOSIS — Z8249 Family history of ischemic heart disease and other diseases of the circulatory system: Secondary | ICD-10-CM

## 2014-07-12 DIAGNOSIS — Z8679 Personal history of other diseases of the circulatory system: Secondary | ICD-10-CM

## 2014-07-12 DIAGNOSIS — N138 Other obstructive and reflux uropathy: Secondary | ICD-10-CM | POA: Diagnosis present

## 2014-07-12 DIAGNOSIS — R3916 Straining to void: Secondary | ICD-10-CM

## 2014-07-12 DIAGNOSIS — Z85828 Personal history of other malignant neoplasm of skin: Secondary | ICD-10-CM

## 2014-07-12 DIAGNOSIS — N400 Enlarged prostate without lower urinary tract symptoms: Secondary | ICD-10-CM | POA: Diagnosis not present

## 2014-07-12 DIAGNOSIS — N210 Calculus in bladder: Secondary | ICD-10-CM | POA: Diagnosis not present

## 2014-07-12 DIAGNOSIS — Z8042 Family history of malignant neoplasm of prostate: Secondary | ICD-10-CM

## 2014-07-12 HISTORY — PX: CYSTOSCOPY WITH LITHOLAPAXY: SHX1425

## 2014-07-12 HISTORY — PX: TRANSURETHRAL RESECTION OF PROSTATE: SHX73

## 2014-07-12 SURGERY — CYSTOSCOPY, WITH BLADDER CALCULUS LITHOLAPAXY
Anesthesia: General | Site: Prostate

## 2014-07-12 MED ORDER — TRAMADOL HCL 50 MG PO TABS: 50.0000 mg | ORAL_TABLET | Freq: Four times a day (QID) | ORAL | Status: DC | PRN

## 2014-07-12 MED ORDER — MIDAZOLAM HCL 2 MG/2ML IJ SOLN
INTRAMUSCULAR | Status: AC
  Filled 2014-07-12: qty 2

## 2014-07-12 MED ORDER — PHENYLEPHRINE HCL 10 MG/ML IJ SOLN
INTRAMUSCULAR | Status: DC | PRN
  Administered 2014-07-12: 40 ug via INTRAVENOUS
  Administered 2014-07-12 (×3): 80 ug via INTRAVENOUS
  Administered 2014-07-12: 40 ug via INTRAVENOUS
  Administered 2014-07-12 (×2): 80 ug via INTRAVENOUS
  Administered 2014-07-12 (×2): 40 ug via INTRAVENOUS

## 2014-07-12 MED ORDER — SODIUM CHLORIDE 0.45 % IV SOLN
INTRAVENOUS | Status: DC
  Administered 2014-07-12: 20:00:00 via INTRAVENOUS

## 2014-07-12 MED ORDER — SODIUM CHLORIDE 0.9 % IR SOLN
3000.0000 mL | Status: DC
  Administered 2014-07-12 (×2): 3000 mL

## 2014-07-12 MED ORDER — CARVEDILOL 6.25 MG PO TABS
6.2500 mg | ORAL_TABLET | Freq: Two times a day (BID) | ORAL | Status: DC
  Administered 2014-07-13 – 2014-07-14 (×3): 6.25 mg via ORAL
  Filled 2014-07-12 (×4): qty 1

## 2014-07-12 MED ORDER — LIDOCAINE HCL (CARDIAC) 20 MG/ML IV SOLN
INTRAVENOUS | Status: AC
  Filled 2014-07-12: qty 5

## 2014-07-12 MED ORDER — SODIUM CHLORIDE 0.9 % IR SOLN
Status: DC | PRN
  Administered 2014-07-12: 6000 mL

## 2014-07-12 MED ORDER — ONDANSETRON HCL 4 MG/2ML IJ SOLN
INTRAMUSCULAR | Status: AC
  Filled 2014-07-12: qty 2

## 2014-07-12 MED ORDER — CIPROFLOXACIN IN D5W 400 MG/200ML IV SOLN
INTRAVENOUS | Status: AC
Start: 2014-07-12 — End: 2014-07-12
  Filled 2014-07-12: qty 200

## 2014-07-12 MED ORDER — DOCUSATE SODIUM 100 MG PO CAPS: 100.0000 mg | ORAL_CAPSULE | Freq: Two times a day (BID) | ORAL | Status: DC

## 2014-07-12 MED ORDER — FENTANYL CITRATE (PF) 100 MCG/2ML IJ SOLN
INTRAMUSCULAR | Status: AC
Start: 2014-07-12 — End: 2014-07-12
  Filled 2014-07-12: qty 2

## 2014-07-12 MED ORDER — HYDROMORPHONE HCL 1 MG/ML IJ SOLN
INTRAMUSCULAR | Status: AC
  Filled 2014-07-12: qty 1

## 2014-07-12 MED ORDER — 0.9 % SODIUM CHLORIDE (POUR BTL) OPTIME
TOPICAL | Status: DC | PRN
  Administered 2014-07-12: 1000 mL

## 2014-07-12 MED ORDER — PHENYLEPHRINE 40 MCG/ML (10ML) SYRINGE FOR IV PUSH (FOR BLOOD PRESSURE SUPPORT)
PREFILLED_SYRINGE | INTRAVENOUS | Status: AC
Start: 2014-07-12 — End: 2014-07-12
  Filled 2014-07-12: qty 10

## 2014-07-12 MED ORDER — LIDOCAINE HCL (CARDIAC) 20 MG/ML IV SOLN
INTRAVENOUS | Status: DC | PRN
  Administered 2014-07-12: 90 mg via INTRAVENOUS

## 2014-07-12 MED ORDER — FENTANYL CITRATE (PF) 100 MCG/2ML IJ SOLN
INTRAMUSCULAR | Status: AC
  Filled 2014-07-12: qty 2

## 2014-07-12 MED ORDER — HYDROCODONE-ACETAMINOPHEN 5-325 MG PO TABS
1.0000 | ORAL_TABLET | ORAL | Status: DC | PRN
  Administered 2014-07-12 – 2014-07-13 (×2): 2 via ORAL
  Administered 2014-07-13: 1 via ORAL
  Administered 2014-07-14: 2 via ORAL
  Filled 2014-07-12: qty 1
  Filled 2014-07-12 (×3): qty 2

## 2014-07-12 MED ORDER — BELLADONNA ALKALOIDS-OPIUM 16.2-60 MG RE SUPP
1.0000 | Freq: Every day | RECTAL | Status: DC
  Administered 2014-07-12: 1 via RECTAL

## 2014-07-12 MED ORDER — SODIUM CHLORIDE 0.9 % IJ SOLN: 3.0000 mL | INTRAMUSCULAR | Status: DC | PRN

## 2014-07-12 MED ORDER — LACTATED RINGERS IV SOLN
INTRAVENOUS | Status: DC
  Administered 2014-07-12: 19:00:00 via INTRAVENOUS
  Administered 2014-07-12: 1000 mL via INTRAVENOUS

## 2014-07-12 MED ORDER — PHENAZOPYRIDINE HCL 100 MG PO TABS: 100.0000 mg | ORAL_TABLET | Freq: Three times a day (TID) | ORAL | Status: DC | PRN

## 2014-07-12 MED ORDER — ONDANSETRON HCL 4 MG/2ML IJ SOLN
INTRAMUSCULAR | Status: DC | PRN
  Administered 2014-07-12: 4 mg via INTRAVENOUS

## 2014-07-12 MED ORDER — FENTANYL CITRATE (PF) 100 MCG/2ML IJ SOLN: INTRAMUSCULAR | Status: DC | PRN

## 2014-07-12 MED ORDER — PROPOFOL 10 MG/ML IV BOLUS: INTRAVENOUS | Status: DC | PRN

## 2014-07-12 MED ORDER — DOCUSATE SODIUM 100 MG PO CAPS
100.0000 mg | ORAL_CAPSULE | Freq: Two times a day (BID) | ORAL | Status: DC
  Administered 2014-07-12 – 2014-07-14 (×4): 100 mg via ORAL
  Filled 2014-07-12 (×5): qty 1

## 2014-07-12 MED ORDER — ONDANSETRON HCL 4 MG/2ML IJ SOLN: 4.0000 mg | Freq: Once | INTRAMUSCULAR | Status: DC | PRN

## 2014-07-12 MED ORDER — FLECAINIDE ACETATE 50 MG PO TABS
75.0000 mg | ORAL_TABLET | Freq: Two times a day (BID) | ORAL | Status: DC
  Administered 2014-07-13 – 2014-07-14 (×3): 75 mg via ORAL
  Filled 2014-07-12 (×4): qty 2

## 2014-07-12 MED ORDER — CIPROFLOXACIN IN D5W 400 MG/200ML IV SOLN
400.0000 mg | Freq: Two times a day (BID) | INTRAVENOUS | Status: AC
  Administered 2014-07-13 (×2): 400 mg via INTRAVENOUS
  Filled 2014-07-12 (×4): qty 200

## 2014-07-12 MED ORDER — PHENYLEPHRINE 40 MCG/ML (10ML) SYRINGE FOR IV PUSH (FOR BLOOD PRESSURE SUPPORT)
PREFILLED_SYRINGE | INTRAVENOUS | Status: AC
  Filled 2014-07-12: qty 10

## 2014-07-12 MED ORDER — FENTANYL CITRATE (PF) 100 MCG/2ML IJ SOLN
INTRAMUSCULAR | Status: DC | PRN
  Administered 2014-07-12: 50 ug via INTRAVENOUS
  Administered 2014-07-12 (×3): 25 ug via INTRAVENOUS
  Administered 2014-07-12: 50 ug via INTRAVENOUS
  Administered 2014-07-12 (×3): 25 ug via INTRAVENOUS
  Administered 2014-07-12: 50 ug via INTRAVENOUS

## 2014-07-12 MED ORDER — SODIUM CHLORIDE 0.9 % IR SOLN
Status: DC | PRN
  Administered 2014-07-12 (×15): 3000 mL

## 2014-07-12 MED ORDER — PROPOFOL 10 MG/ML IV BOLUS
INTRAVENOUS | Status: AC
  Filled 2014-07-12: qty 20

## 2014-07-12 MED ORDER — ONDANSETRON HCL 4 MG/2ML IJ SOLN
4.0000 mg | INTRAMUSCULAR | Status: DC | PRN
  Administered 2014-07-13: 4 mg via INTRAVENOUS
  Filled 2014-07-12: qty 2

## 2014-07-12 MED ORDER — CIPROFLOXACIN IN D5W 400 MG/200ML IV SOLN
400.0000 mg | INTRAVENOUS | Status: DC
  Administered 2014-07-12: 400 mg via INTRAVENOUS

## 2014-07-12 MED ORDER — DIPHENHYDRAMINE HCL 50 MG/ML IJ SOLN: 12.5000 mg | Freq: Four times a day (QID) | INTRAMUSCULAR | Status: DC | PRN

## 2014-07-12 MED ORDER — FENTANYL CITRATE (PF) 100 MCG/2ML IJ SOLN
25.0000 ug | INTRAMUSCULAR | Status: DC | PRN
  Administered 2014-07-12: 50 ug via INTRAVENOUS
  Administered 2014-07-12: 25 ug via INTRAVENOUS
  Administered 2014-07-12: 50 ug via INTRAVENOUS
  Administered 2014-07-12: 25 ug via INTRAVENOUS

## 2014-07-12 MED ORDER — MIDAZOLAM HCL 5 MG/5ML IJ SOLN
INTRAMUSCULAR | Status: DC | PRN
  Administered 2014-07-12 (×2): 1 mg via INTRAVENOUS

## 2014-07-12 MED ORDER — SODIUM CHLORIDE 0.9 % IV SOLN: 250.0000 mL | INTRAVENOUS | Status: DC | PRN

## 2014-07-12 MED ORDER — PROPOFOL 10 MG/ML IV BOLUS
INTRAVENOUS | Status: DC | PRN
  Administered 2014-07-12: 250 mg via INTRAVENOUS

## 2014-07-12 MED ORDER — ATORVASTATIN CALCIUM 10 MG PO TABS
10.0000 mg | ORAL_TABLET | Freq: Every day | ORAL | Status: DC
  Administered 2014-07-13: 10 mg via ORAL
  Filled 2014-07-12 (×2): qty 1

## 2014-07-12 MED ORDER — STERILE WATER FOR IRRIGATION IR SOLN
Status: DC | PRN
  Administered 2014-07-12: 3000 mL

## 2014-07-12 MED ORDER — DIPHENHYDRAMINE HCL 12.5 MG/5ML PO ELIX
12.5000 mg | ORAL_SOLUTION | Freq: Four times a day (QID) | ORAL | Status: DC | PRN
  Filled 2014-07-12: qty 5

## 2014-07-12 MED ORDER — SODIUM CHLORIDE 0.9 % IJ SOLN
3.0000 mL | Freq: Two times a day (BID) | INTRAMUSCULAR | Status: DC
  Administered 2014-07-13: 3 mL via INTRAVENOUS

## 2014-07-12 MED ORDER — BELLADONNA ALKALOIDS-OPIUM 16.2-60 MG RE SUPP
RECTAL | Status: AC
Start: 2014-07-12 — End: 2014-07-12
  Filled 2014-07-12: qty 1

## 2014-07-12 MED ORDER — AMLODIPINE BESYLATE 5 MG PO TABS
5.0000 mg | ORAL_TABLET | Freq: Every day | ORAL | Status: DC
  Administered 2014-07-13 – 2014-07-14 (×2): 5 mg via ORAL
  Filled 2014-07-12 (×2): qty 1

## 2014-07-12 MED ORDER — HYDROMORPHONE HCL 1 MG/ML IJ SOLN
0.2500 mg | INTRAMUSCULAR | Status: DC | PRN
  Administered 2014-07-12 (×2): 0.25 mg via INTRAVENOUS
  Administered 2014-07-12: 0.5 mg via INTRAVENOUS

## 2014-07-12 SURGICAL SUPPLY — 17 items
BAG URINE DRAINAGE (UROLOGICAL SUPPLIES) ×3 IMPLANT
BAG URO CATCHER STRL LF (DRAPE) ×3 IMPLANT
CATH HEMA 3WAY 30CC 22FR COUDE (CATHETERS) ×3 IMPLANT
CLOTH BEACON ORANGE TIMEOUT ST (SAFETY) ×3 IMPLANT
ELECT REM PT RETURN 9FT ADLT (ELECTROSURGICAL) ×3
ELECTRODE REM PT RTRN 9FT ADLT (ELECTROSURGICAL) ×2 IMPLANT
GLOVE BIOGEL M STRL SZ7.5 (GLOVE) ×3 IMPLANT
GOWN STRL REUS W/TWL LRG LVL3 (GOWN DISPOSABLE) ×6 IMPLANT
GOWN STRL REUS W/TWL XL LVL3 (GOWN DISPOSABLE) ×3 IMPLANT
HOLDER FOLEY CATH W/STRAP (MISCELLANEOUS) ×3 IMPLANT
KIT ASPIRATION TUBING (SET/KITS/TRAYS/PACK) ×3 IMPLANT
LOOP CUT BIPOLAR 24F LRG (ELECTROSURGICAL) ×3 IMPLANT
MANIFOLD NEPTUNE II (INSTRUMENTS) ×3 IMPLANT
PACK CYSTO (CUSTOM PROCEDURE TRAY) ×3 IMPLANT
SUT ETHILON 3 0 PS 1 (SUTURE) IMPLANT
SYRINGE IRR TOOMEY STRL 70CC (SYRINGE) ×3 IMPLANT
TUBING CONNECTING 10 (TUBING) ×3 IMPLANT

## 2014-07-12 NOTE — Op Note (Signed)
Preoperative diagnosis: 1. Bladder outlet obstruction secondary to BPH 2. Bladder calculi  Postoperative diagnosis:  1. Bladder outlet obstruction secondary to BPH 2. Bladder calculi  Procedure:  1. Cystoscopy 2. Transurethral resection of the prostate 3. Cystolithalopaxy (largest stone 2.5 cm)  Surgeon: Roxy Horseman, Brooke Bonito. M.D.  Anesthesia: General  Complications: None  EBL: Minimal  Specimens: 1. Prostate chips 2. Bladder calculi  Disposition of specimens: Pathology for prostate chips  Indication: Patrick Weaver is a patient with bladder outlet obstruction secondary to benign prostatic hyperplasia and complicating multiple bladder calculi. After reviewing the management options for treatment, he elected to proceed with the above surgical procedure(s). We have discussed the potential benefits and risks of the procedure, side effects of the proposed treatment, the likelihood of the patient achieving the goals of the procedure, and any potential problems that might occur during the procedure or recuperation. Informed consent has been obtained.  Description of procedure:  The patient was taken to the operating room and general anesthesia was induced.  The patient was placed in the dorsal lithotomy position, prepped and draped in the usual sterile fashion, and preoperative antibiotics were administered. A preoperative time-out was performed.   Cystourethroscopy was performed.  The patient's urethra was examined and demonstrated no anterior urethral abnormalities with bilobar prostatic hypertrophy with a median lobe.  Prostate length was about 3.5 cm.  The bladder was then systematically examined in its entirety. There was no evidence of any bladder tumors, but there were multiple bladder stones measuring up to 2.5 cm and mild trabeculation.  The bladder stones were able to be removed via the resectoscope with irrigation except for the largest stone.  This stone was able to be  manipulated into the prostatic urethra with the resection loop and carefully brought out to the meatus under direct vision.  The ureteral orifices were identified and marked so as to be avoided during the procedure.  The prostate adenoma was then resected utilizing loop cautery resection with the bipolar cutting loop.  The prostate adenoma from the bladder neck back to the verumontanum was resected beginning at the six o'clock position and then extended to include the right and left lobes of the prostate and anterior prostate. Care was taken not to resect distal to the verumontanum.  Hemostasis was then achieved with the cautery and the bladder was emptied and reinspected with no significant bleeding noted at the end of the procedure.    A 3 way catheter was then placed into the bladder and placed on continuous bladder irrigation.  The patient appeared to tolerate the procedure well and without complications.  The patient was able to be awakened and transferred to the recovery unit in satisfactory condition.

## 2014-07-12 NOTE — Anesthesia Procedure Notes (Addendum)
Procedure Name: LMA Insertion Date/Time: 07/12/2014 4:47 PM Performed by: Carleene Cooper A Pre-anesthesia Checklist: Patient identified, Emergency Drugs available, Suction available and Patient being monitored Patient Re-evaluated:Patient Re-evaluated prior to inductionOxygen Delivery Method: Circle system utilized Preoxygenation: Pre-oxygenation with 100% oxygen Intubation Type: IV induction Ventilation: Mask ventilation without difficulty LMA: LMA with gastric port inserted LMA Size: 4.0 Number of attempts: 2 (same size, just trying to reseat properly) Tube secured with: Tape Dental Injury: Teeth and Oropharynx as per pre-operative assessment

## 2014-07-12 NOTE — Progress Notes (Signed)
RT placed patient on CPAP. Patient is on auto 7-16 cmH2O for pt comfort. Sterile water added to water chamber for humidification. Patient seems to be tolerating well. RT will continue to monitor.

## 2014-07-12 NOTE — Interval H&P Note (Signed)
History and Physical Interval Note:  07/12/2014 4:07 PM  Patrick Weaver  has presented today for surgery, with the diagnosis of BENIGN PROSTATIC HYPERPLASIA, BLADDER CALCULI  The various methods of treatment have been discussed with the patient and family. After consideration of risks, benefits and other options for treatment, the patient has consented to  Procedure(s): CYSTOSCOPY WITH LITHOLAPAXY (N/A) TRANSURETHRAL RESECTION OF THE PROSTATE (TURP) (N/A) as a surgical intervention .  The patient's history has been reviewed, patient examined, no change in status, stable for surgery.  I have reviewed the patient's chart and labs.  Questions were answered to the patient's satisfaction.     Kaisen Ackers,LES

## 2014-07-12 NOTE — Anesthesia Postprocedure Evaluation (Signed)
Anesthesia Post Note  Patient: Patrick Weaver  Procedure(s) Performed: Procedure(s) (LRB): CYSTOSCOPY WITH LITHOLAPAXY (N/A) TRANSURETHRAL RESECTION OF THE PROSTATE (TURP) (N/A)  Anesthesia type: General  Patient location: PACU  Post pain: Pain level controlled and Adequate analgesia  Post assessment: Post-op Vital signs reviewed, Patient's Cardiovascular Status Stable, Respiratory Function Stable, Patent Airway and Pain level controlled  Last Vitals:  Filed Vitals:   07/12/14 1945  BP: 140/74  Pulse: 72  Temp:   Resp: 12    Post vital signs: Reviewed and stable  Level of consciousness: awake, alert  and oriented  Complications: No apparent anesthesia complications

## 2014-07-12 NOTE — Discharge Instructions (Signed)
1. You may see some blood in the urine and may have some burning with urination for 48-72 hours. You also may notice that you have to urinate more frequently or urgently after your procedure which is normal.  °2. You should call should you develop an inability urinate, fever > 101, persistent nausea and vomiting that prevents you from eating or drinking to stay hydrated.  °

## 2014-07-12 NOTE — Transfer of Care (Signed)
Immediate Anesthesia Transfer of Care Note  Patient: Patrick Weaver  Procedure(s) Performed: Procedure(s): CYSTOSCOPY WITH LITHOLAPAXY (N/A) TRANSURETHRAL RESECTION OF THE PROSTATE (TURP) (N/A)  Patient Location: PACU  Anesthesia Type:General  Level of Consciousness:  sedated, patient cooperative and responds to stimulation  Airway & Oxygen Therapy:Patient Spontanous Breathing and Patient connected to face mask oxgen  Post-op Assessment:  Report given to PACU RN and Post -op Vital signs reviewed and stable  Post vital signs:  Reviewed and stable  Last Vitals:  Filed Vitals:   07/12/14 1416  BP: 118/95  Pulse: 76  Temp: 36.4 C  Resp: 20    Complications: No apparent anesthesia complications

## 2014-07-12 NOTE — Anesthesia Preprocedure Evaluation (Addendum)
Anesthesia Evaluation  Patient identified by MRN, date of birth, ID band Patient awake    Reviewed: Allergy & Precautions, H&P , NPO status , Patient's Chart, lab work & pertinent test results, reviewed documented beta blocker date and time , Unable to perform ROS - Chart review only  History of Anesthesia Complications Negative for: history of anesthetic complications  Airway Mallampati: II  TM Distance: >3 FB Neck ROM: full    Dental no notable dental hx.    Pulmonary sleep apnea and Continuous Positive Airway Pressure Ventilation ,  breath sounds clear to auscultation  Pulmonary exam normal       Cardiovascular hypertension, On Medications Normal cardiovascular exam+ dysrhythmias (PVCs) + pacemaker ([employer], 2009 placed) Rhythm:regular Rate:Normal     Neuro/Psych negative neurological ROS  negative psych ROS   GI/Hepatic negative GI ROS, Neg liver ROS,   Endo/Other  negative endocrine ROS  Renal/GU negative Renal ROS     Musculoskeletal   Abdominal   Peds  Hematology negative hematology ROS (+)   Anesthesia Other Findings NPO appropriate, allergies reviewed Denies active cardiac or pulmonary symptoms, METS > 4 No recent congestive cough or symptoms of upper respiratory infection -meds today:  Last 2 D ECHO in 2015 with grade 1 diastolic dysfunction, EF 22% - Compensated  - Has pacemaker - will continue antiarrythmic and BB   Reproductive/Obstetrics negative OB ROS                          Anesthesia Physical Anesthesia Plan  ASA: III  Anesthesia Plan: General   Post-op Pain Management:    Induction: Intravenous  Airway Management Planned: LMA  Additional Equipment:   Intra-op Plan:   Post-operative Plan:   Informed Consent: I have reviewed the patients History and Physical, chart, labs and discussed the procedure including the risks, benefits and alternatives for the  proposed anesthesia with the patient or authorized representative who has indicated his/her understanding and acceptance.   Dental Advisory Given  Plan Discussed with: Anesthesiologist, CRNA and Surgeon  Anesthesia Plan Comments:        Anesthesia Quick Evaluation

## 2014-07-13 ENCOUNTER — Encounter (HOSPITAL_COMMUNITY): Payer: Self-pay | Admitting: Urology

## 2014-07-13 DIAGNOSIS — N210 Calculus in bladder: Secondary | ICD-10-CM | POA: Diagnosis present

## 2014-07-13 DIAGNOSIS — G4730 Sleep apnea, unspecified: Secondary | ICD-10-CM | POA: Diagnosis present

## 2014-07-13 DIAGNOSIS — Z85828 Personal history of other malignant neoplasm of skin: Secondary | ICD-10-CM | POA: Diagnosis not present

## 2014-07-13 DIAGNOSIS — R972 Elevated prostate specific antigen [PSA]: Secondary | ICD-10-CM | POA: Diagnosis present

## 2014-07-13 DIAGNOSIS — Z8249 Family history of ischemic heart disease and other diseases of the circulatory system: Secondary | ICD-10-CM | POA: Diagnosis not present

## 2014-07-13 DIAGNOSIS — Z8679 Personal history of other diseases of the circulatory system: Secondary | ICD-10-CM | POA: Diagnosis not present

## 2014-07-13 DIAGNOSIS — N401 Enlarged prostate with lower urinary tract symptoms: Secondary | ICD-10-CM | POA: Diagnosis present

## 2014-07-13 DIAGNOSIS — Z950 Presence of cardiac pacemaker: Secondary | ICD-10-CM | POA: Diagnosis not present

## 2014-07-13 DIAGNOSIS — Z79899 Other long term (current) drug therapy: Secondary | ICD-10-CM | POA: Diagnosis not present

## 2014-07-13 DIAGNOSIS — Z9889 Other specified postprocedural states: Secondary | ICD-10-CM | POA: Diagnosis not present

## 2014-07-13 DIAGNOSIS — Z87891 Personal history of nicotine dependence: Secondary | ICD-10-CM | POA: Diagnosis not present

## 2014-07-13 DIAGNOSIS — Z8042 Family history of malignant neoplasm of prostate: Secondary | ICD-10-CM | POA: Diagnosis not present

## 2014-07-13 DIAGNOSIS — N138 Other obstructive and reflux uropathy: Secondary | ICD-10-CM | POA: Diagnosis present

## 2014-07-13 LAB — HEMOGLOBIN AND HEMATOCRIT, BLOOD
HEMATOCRIT: 33.5 % — AB (ref 39.0–52.0)
Hemoglobin: 11.1 g/dL — ABNORMAL LOW (ref 13.0–17.0)

## 2014-07-13 LAB — BASIC METABOLIC PANEL WITH GFR
Anion gap: 6 (ref 5–15)
BUN: 16 mg/dL (ref 6–20)
CO2: 26 mmol/L (ref 22–32)
Calcium: 8.2 mg/dL — ABNORMAL LOW (ref 8.9–10.3)
Chloride: 102 mmol/L (ref 101–111)
Creatinine, Ser: 1.24 mg/dL (ref 0.61–1.24)
GFR calc Af Amer: >60 mL/min
GFR calc non Af Amer: 59 mL/min — ABNORMAL LOW
Glucose, Bld: 106 mg/dL — ABNORMAL HIGH (ref 65–99)
Potassium: 4.3 mmol/L (ref 3.5–5.1)
Sodium: 134 mmol/L — ABNORMAL LOW (ref 135–145)

## 2014-07-13 MED ORDER — SODIUM CHLORIDE 0.45 % IV SOLN
INTRAVENOUS | Status: DC
  Administered 2014-07-13 – 2014-07-14 (×2): via INTRAVENOUS

## 2014-07-13 MED ORDER — MORPHINE SULFATE 2 MG/ML IJ SOLN: 2.0000 mg | INTRAMUSCULAR | Status: DC | PRN

## 2014-07-13 MED ORDER — PHENAZOPYRIDINE HCL 100 MG PO TABS
100.0000 mg | ORAL_TABLET | Freq: Three times a day (TID) | ORAL | Status: DC | PRN
  Filled 2014-07-13 (×2): qty 1

## 2014-07-13 MED ORDER — CHLORHEXIDINE GLUCONATE 0.12 % MT SOLN
15.0000 mL | Freq: Two times a day (BID) | OROMUCOSAL | Status: DC
  Filled 2014-07-13 (×3): qty 15

## 2014-07-13 MED ORDER — CETYLPYRIDINIUM CHLORIDE 0.05 % MT LIQD
7.0000 mL | Freq: Two times a day (BID) | OROMUCOSAL | Status: DC
  Administered 2014-07-13: 7 mL via OROMUCOSAL

## 2014-07-13 NOTE — Care Management Note (Signed)
Case Management Note  Patient Details  Name: Patrick Weaver MRN: [id number] Date of Birth: October 24, 1945  Subjective/Objective: 69 y/o m admitted w/BPH. From home.                   Action/Plan:No anticipated d/c needs.   Expected Discharge Date:                  Expected Discharge Plan:  Home/Self Care  In-House Referral:     Discharge planning Services  CM Consult  Post Acute Care Choice:    Choice offered to:     DME Arranged:    DME Agency:     HH Arranged:    HH Agency:     Status of Service:  In process, will continue to follow  Medicare Important Message Given:    Date Medicare IM Given:    Medicare IM give by:    Date Additional Medicare IM Given:    Additional Medicare Important Message give by:     If discussed at [place] of Stay Meetings, dates discussed:    Additional Comments:  Dessa Phi, RN 07/13/2014, 1:17 PM

## 2014-07-13 NOTE — Progress Notes (Signed)
Patient ID: Patrick Weaver, male   DOB: February 10, 1945, 69 y.o.   MRN: [id number]  Pt has voided 3 times today with mostly dark red urine.  He has developed worsening penile pain over the past few hours.  Bladder scans have indicated between 100-200 cc in the bladder.  Pt is currently in obvious discomfort and pain.    I inserted a 16 Fr catheter under sterile conditions with return of 800 cc of grossly tea colored urine.  Pt with immediate relief.  I hand irrigated catheter without evidence of clots noted.  Plan: Will keep him admitted due to poor po intake and for pain control.  Will reassess in the morning for another voiding trial vs. discharge with the catheter.

## 2014-07-13 NOTE — Progress Notes (Signed)
Patient ID: Patrick Weaver, male   DOB: 12/05/1945, 69 y.o.   MRN: [id number]  1 Day Post-Op Subjective: Pt doing well.  CBI titrated down overnight.   Objective: Vital signs in last 24 hours: Temp:  [97.5 F (36.4 C)-97.9 F (36.6 C)] 97.7 F (36.5 C) (07/12 0525) Pulse Rate:  [69-76] 69 (07/12 0525) Resp:  [12-22] 12 (07/12 0525) BP: (114-152)/(62-98) 117/63 mmHg (07/12 0525) SpO2:  [95 %-100 %] 100 % (07/12 0525) Weight:  [94.802 kg (209 lb)-96.9 kg (213 lb 10 oz)] 96.9 kg (213 lb 10 oz) (07/11 2111)  Intake/Output from previous day: 07/11 0701 - 07/12 0700 In: 65993 [P.O.:360; I.V.:2220; IV Piggyback:200] Out: 22850 [Urine:22850] Intake/Output this shift: Total I/O In: 57017 [P.O.:360; I.V.:1220; BLTJQ:30092; IV Piggyback:200] Out: 22850 [Urine:22850]  Physical Exam:  General: Alert and oriented CV: RRR Lungs: Clear Abdomen: Soft, ND GU: Urine is light pink on minimal drip Ext: NT, No erythema  Lab Results:  Recent Labs  07/13/14 0405  HGB 11.1*  HCT 33.5*   BMET  Recent Labs  07/13/14 0405  NA 134*  K 4.3  CL 102  CO2 26  GLUCOSE 106*  BUN 16  CREATININE 1.24  CALCIUM 8.2*   CBI turned off and pt monitored for 20 minutes with drip off.  Urine remained pink to light red off drip.  Catheter was removed.  Assessment/Plan: BPH/LUTS/bladder calculi: - Voiding trial this morning - D/C home later     Erven Ramson,LES 07/13/2014, 6:39 AM

## 2014-07-13 NOTE — Progress Notes (Signed)
Patient placed self on CPAP. Patient is resting well. RT will continue to monitor.

## 2014-07-13 NOTE — Progress Notes (Signed)
Dr Alinda Money notified of PVR and 3 post-catheter voids

## 2014-07-14 NOTE — Progress Notes (Signed)
Patient ID: Patrick Weaver, male   DOB: Dec 20, 1945, 69 y.o.   MRN: [id number]  2 Days Post-Op Subjective: Pt did well overnight with pain relieved since catheter placement.  Objective: Vital signs in last 24 hours: Temp:  [98 F (36.7 C)-98.1 F (36.7 C)] 98 F (36.7 C) (07/13 0533) Pulse Rate:  [70] 70 (07/13 0533) Resp:  [15-20] 16 (07/13 0533) BP: (113-157)/(60-81) 113/61 mmHg (07/13 0533) SpO2:  [98 %-100 %] 98 % (07/13 0533)  Intake/Output from previous day: 07/12 0701 - 07/13 0700 In: 340 [IV Piggyback:200] Out: 4521 [Urine:4521] Intake/Output this shift:    Physical Exam:  General: Alert and oriented GU: Urine very light pink and draining well.  Lab Results:  Recent Labs  07/13/14 0405  HGB 11.1*  HCT 33.5*   BMET  Recent Labs  07/13/14 0405  NA 134*  K 4.3  CL 102  CO2 26  GLUCOSE 106*  BUN 16  CREATININE 1.24  CALCIUM 8.2*     Studies/Results: No results found.  Assessment/Plan: BPH/LUTS s/p TURP - Will d/c home with catheter and plan for outpatient voiding trial.  Pt does not wish to proceed with voiding trial today.   LOS: 1 day   Patrick Weaver,LES 07/14/2014, 7:04 AM

## 2014-07-14 NOTE — Discharge Summary (Signed)
  Date of admission: 07/12/2014  Date of discharge: 07/14/2014  Admission diagnosis: BPH/LUTS, Bladder calculi  Discharge diagnosis: BPH/LUTS, Bladder calculi  History and Physical: For full details, please see admission history and physical. Briefly, Patrick Weaver is a 69 y.o. year old patient with BPH and LUTS with recent urinary retention.   Hospital Course: He was taken to the OR for cystolithalopaxy and TURP.  His procedure was without complications.  He was maintained on CBI overnight.  He underwent a voiding trial on POD#1. He developed urinary retention requiring catheter replacement.  He was monitored the next night and his catheter continued to drain well with light pink urine.  He was given the option of a voiding trial on POD#2 but preferred to be discharged with a cathter and to undergo a voiding trial as an outpatient next week.  Laboratory values:  Recent Labs  07/13/14 0405  HGB 11.1*  HCT 33.5*    Recent Labs  07/13/14 0405  CREATININE 1.24    Disposition: Home  Discharge instruction: The patient was instructed to be ambulatory but told to refrain from heavy lifting, strenuous activity, or driving. He was given instructions on catheter care.  Discharge medications:    Medication List    STOP taking these medications        sulfamethoxazole-trimethoprim 800-160 MG per tablet  Commonly known as:  BACTRIM DS,SEPTRA DS     tamsulosin 0.4 MG Caps capsule  Commonly known as:  FLOMAX      TAKE these medications        acetaminophen 325 MG tablet  Commonly known as:  TYLENOL  Take 2 tablets (650 mg total) by mouth every 6 (six) hours as needed for mild pain (or Fever >/= 101).     amLODipine 5 MG tablet  Commonly known as:  NORVASC  TAKE 1 TABLET BY MOUTH ONCE DAILY     atorvastatin 10 MG tablet  Commonly known as:  LIPITOR  TAKE 1 TABLET (10 MG TOTAL) BY MOUTH DAILY.     carvedilol 6.25 MG tablet  Commonly known as:  COREG  TAKE 1 TABLET BY MOUTH  TWICE A DAY     docusate sodium 100 MG capsule  Commonly known as:  COLACE  Take 1 capsule (100 mg total) by mouth 2 (two) times daily.     flecainide 150 MG tablet  Commonly known as:  TAMBOCOR  TAKE 1/2 TABLET 2 TIMES A DAY     phenazopyridine 100 MG tablet  Commonly known as:  PYRIDIUM  Take 1 tablet (100 mg total) by mouth 3 (three) times daily as needed for pain (for burning).     traMADol 50 MG tablet  Commonly known as:  ULTRAM  Take 1 tablet (50 mg total) by mouth every 6 (six) hours as needed for moderate pain.        Followup:      Follow-up Information    Follow up with Dutch Gray, MD.   Specialty:  Urology   Why:  10/06/14 at 8:30 am   Contact information:   [place] [address] [phone]       Follow up with Dutch Gray, MD.   Specialty:  Urology   Why:  Will call to schedule follow up for catheter removal next week   Contact information:   [place] [address] [phone]

## 2014-07-23 ENCOUNTER — Other Ambulatory Visit: Payer: Self-pay | Admitting: Internal Medicine

## 2014-08-17 ENCOUNTER — Other Ambulatory Visit: Payer: Self-pay | Admitting: Internal Medicine

## 2014-08-19 ENCOUNTER — Other Ambulatory Visit: Payer: Self-pay | Admitting: Internal Medicine

## 2014-09-02 ENCOUNTER — Other Ambulatory Visit: Payer: Self-pay

## 2014-09-02 ENCOUNTER — Ambulatory Visit (INDEPENDENT_AMBULATORY_CARE_PROVIDER_SITE_OTHER): Payer: 59 | Admitting: Internal Medicine

## 2014-09-02 ENCOUNTER — Encounter: Payer: Self-pay | Admitting: Internal Medicine

## 2014-09-02 VITALS — BP 122/84 | HR 74 | Ht 72.0 in | Wt 207.8 lb

## 2014-09-02 DIAGNOSIS — Z950 Presence of cardiac pacemaker: Secondary | ICD-10-CM

## 2014-09-02 DIAGNOSIS — I495 Sick sinus syndrome: Secondary | ICD-10-CM | POA: Diagnosis not present

## 2014-09-02 DIAGNOSIS — Z45018 Encounter for adjustment and management of other part of cardiac pacemaker: Secondary | ICD-10-CM

## 2014-09-02 DIAGNOSIS — R001 Bradycardia, unspecified: Secondary | ICD-10-CM | POA: Diagnosis not present

## 2014-09-02 DIAGNOSIS — I498 Other specified cardiac arrhythmias: Secondary | ICD-10-CM

## 2014-09-02 LAB — CUP PACEART INCLINIC DEVICE CHECK
Battery Impedance: 737 Ohm
Battery Remaining Longevity: 68 mo
Brady Statistic AP VP Percent: 9 %
Brady Statistic AS VP Percent: 1 %
Brady Statistic AS VS Percent: 3 %
Date Time Interrogation Session: 20160901114047
Lead Channel Impedance Value: 486 Ohm
Lead Channel Pacing Threshold Amplitude: 0.5 V
Lead Channel Pacing Threshold Amplitude: 0.5 V
Lead Channel Pacing Threshold Pulse Width: 0.4 ms
Lead Channel Sensing Intrinsic Amplitude: 1.4 mV
Lead Channel Sensing Intrinsic Amplitude: 15.67 mV
Lead Channel Setting Pacing Amplitude: 2 V
Lead Channel Setting Pacing Amplitude: 2.5 V
Lead Channel Setting Pacing Pulse Width: 0.4 ms
Lead Channel Setting Sensing Sensitivity: 5.6 mV
MDC IDC MSMT BATTERY VOLTAGE: 2.78 V
MDC IDC MSMT LEADCHNL RA IMPEDANCE VALUE: 408 Ohm
MDC IDC MSMT LEADCHNL RV PACING THRESHOLD PULSEWIDTH: 0.4 ms
MDC IDC STAT BRADY AP VS PERCENT: 88 %

## 2014-09-02 NOTE — Patient Instructions (Signed)
Medication Instructions:  Your physician recommends that you continue on your current medications as directed. Please refer to the Current Medication list given to you today.  Labwork: None ordered  Testing/Procedures: None ordered  Follow-Up: Remote monitoring is used to monitor your Pacemaker of ICD from home. This monitoring reduces the number of office visits required to check your device to one time per year. It allows us to keep an eye on the functioning of your device to ensure it is working properly. You are scheduled for a device check from home on 12/02/14. You may send your transmission at any time that day. If you have a wireless device, the transmission will be sent automatically. After your physician reviews your transmission, you will receive a postcard with your next transmission date.  Your physician wants you to follow-up in: 1 year with Dr. Klein.  You will receive a reminder letter in the mail two months in advance. If you don't receive a letter, please call our office to schedule the follow-up appointment.  Any Other Special Instructions Will Be Listed Below (If Applicable). Thank you for choosing [facility]!!         

## 2014-09-02 NOTE — Progress Notes (Signed)
Patient Care Team: Vernie Shanks, MD as PCP - General (Family Medicine)   HPI  Patrick Weaver is a 69 y.o. male seen in followup for pacer implantation for bradycardia. He has also had significant problems with pvcs. This has been treated with flecanide with much improvement.  He denies shortness of breath. He has occasional palpitations. He describes exertional chest tightness. Left Heart catheterization Aug 2014 demonstrated no significant coronary disease. EF 65%  .  Echo 2013 Aortic regurgitation Mild to moderate regurgitation directed centrally  in the LVOT. Regurgitation pressure half-time: 572ms.  Aorta: Ascending aortic aneurysm. Aortic root dimension: 24mm  Ascending aortic diameter: 45 mm by CT 2011   Repeat echocardiogram 8/15 was inaccurate in measuring the aortic root measured at 42 mm.   Repeat CT 7/14 showed stable aortic diameter 45 mm  The patient denies chest pain, shortness of breath, nocturnal dyspnea, orthopnea or peripheral edema. There have been no palpitations, lightheadedness or syncope.    He had prostate surgery for difficulty voiding. This was very very painful. He is gradually getting better.  Past Medical History  Diagnosis Date  . Palpitations   . Dyslipidemia     failed niaspan  . Bradycardia     treated with pacemaker placement- Dr. Virl Axe MD  . HTN (hypertension)   . PVCs (premature ventricular contractions)   . Pacemaker reprogramming/check   . PACEMAKER-Medtronic 08/31/2008    Qualifier: Diagnosis of  By: Caryl Comes, MD, Remus Blake   . Bladder stone   . BPH (benign prostatic hyperplasia)   . Sleep apnea     USES C-PAP    Past Surgical History  Procedure Laterality Date  . Permanent pacemaker  2009  . Tonsillectomy    . Cystoscopy with litholapaxy N/A 07/12/2014    Procedure: CYSTOSCOPY WITH LITHOLAPAXY;  Surgeon: Raynelle Bring, MD;  Location: [facility];  Service: Urology;  Laterality: N/A;  . Transurethral resection  of prostate N/A 07/12/2014    Procedure: TRANSURETHRAL RESECTION OF THE PROSTATE (TURP);  Surgeon: Raynelle Bring, MD;  Location: [facility];  Service: Urology;  Laterality: N/A;    Current Outpatient Prescriptions  Medication Sig Dispense Refill  . acetaminophen (TYLENOL) 325 MG tablet Take 2 tablets (650 mg total) by mouth every 6 (six) hours as needed for mild pain (or Fever >/= 101). 30 tablet 0  . amLODipine (NORVASC) 5 MG tablet TAKE 1 TABLET BY MOUTH ONCE DAILY 90 tablet 3  . atorvastatin (LIPITOR) 10 MG tablet TAKE 1 TABLET (10 MG TOTAL) BY MOUTH DAILY. 90 tablet 1  . carvedilol (COREG) 6.25 MG tablet TAKE 1 TABLET BY MOUTH TWICE A DAY 180 tablet 0  . docusate sodium (COLACE) 100 MG capsule Take 1 capsule (100 mg total) by mouth 2 (two) times daily. 30 capsule 0  . flecainide (TAMBOCOR) 150 MG tablet Take 1/2 tablet by mouth twice a day     No current facility-administered medications for this visit.    Allergies  Allergen Reactions  . Codeine Nausea Only    Review of Systems negative except from HPI and PMH  Physical Exam BP 122/84 mmHg  Pulse 74  Ht 6' (1.829 m)  Wt 207 lb 12.8 oz (94.257 kg)  BMI 28.18 kg/m2 Well developed and well nourished in no acute distress HENT normal E scleral and icterus clear Neck Supple JVP flat; carotids brisk and full Clear to ausculation  Device pocket well healed; without hematoma or erythema.  There is  no tethering   Regular rate and rhythm, no murmurs gallops or rub Soft with active bowel sounds No clubbing cyanosis  Edema Alert and oriented, grossly normal motor and sensory function Skin Warm and Dry  Ecg  Atrial paced rhythm QRS duration was about 108 ms on 23 June PVCs were prevalent  Echocardiogram 7  Assessment and  Plan  Sinus node dysfunction  Pacemaker-Medtronic The patient's device was interrogated.  The information was reviewed. No changes were made in the programming.     PVCs-flecainide  HTN    Aortic root  dilatation with central AI  Aortic root assessment by echo underestimated CT measurements suggesting that it is inadequately sensitive. However, clearly there is no significant interval enlargement. He is to see Dr. [name] in the spring  Device interrogation demonstrates PVCs at a rate of about 4000 per day. There is no significant symptoms. As noted above, LV function remains normal.  He is gradually recovering from prostate surgery which left him with a very very sore perineal area; activity has been not. Histograms are flat. He will let us know how he does exercise-wise as he becomes more active

## 2014-10-08 ENCOUNTER — Other Ambulatory Visit: Payer: Self-pay | Admitting: Internal Medicine

## 2014-10-11 ENCOUNTER — Other Ambulatory Visit: Payer: Self-pay | Admitting: Internal Medicine

## 2014-10-25 ENCOUNTER — Other Ambulatory Visit: Payer: Self-pay | Admitting: Internal Medicine

## 2014-12-02 ENCOUNTER — Ambulatory Visit (INDEPENDENT_AMBULATORY_CARE_PROVIDER_SITE_OTHER): Payer: 59 | Admitting: *Deleted

## 2014-12-02 ENCOUNTER — Telehealth: Payer: Self-pay | Admitting: Cardiology

## 2014-12-02 DIAGNOSIS — R001 Bradycardia, unspecified: Secondary | ICD-10-CM

## 2014-12-02 NOTE — Telephone Encounter (Signed)
Spoke with pt and reminded pt of remote transmission that is due today. Pt verbalized understanding.   

## 2014-12-03 NOTE — Progress Notes (Signed)
Remote pacemaker transmission.   

## 2014-12-09 LAB — CUP PACEART REMOTE DEVICE CHECK
Battery Remaining Longevity: 66 mo
Brady Statistic AP VP Percent: 6 %
Brady Statistic AP VS Percent: 92 %
Brady Statistic AS VP Percent: 0 %
Implantable Lead Implant Date: 20090316
Implantable Lead Location: 753860
Implantable Lead Model: 5076
Implantable Lead Model: 5076
Lead Channel Impedance Value: 430 Ohm
Lead Channel Setting Pacing Amplitude: 2 V
Lead Channel Setting Pacing Amplitude: 2.5 V
MDC IDC LEAD IMPLANT DT: 20090316
MDC IDC LEAD LOCATION: 753859
MDC IDC MSMT BATTERY IMPEDANCE: 788 Ohm
MDC IDC MSMT BATTERY VOLTAGE: 2.79 V
MDC IDC MSMT LEADCHNL RV IMPEDANCE VALUE: 516 Ohm
MDC IDC SESS DTM: 20161201183631
MDC IDC SET LEADCHNL RV PACING PULSEWIDTH: 0.4 ms
MDC IDC SET LEADCHNL RV SENSING SENSITIVITY: 4 mV
MDC IDC STAT BRADY AS VS PERCENT: 1 %

## 2014-12-14 ENCOUNTER — Encounter: Payer: Self-pay | Admitting: Cardiology

## 2015-02-15 ENCOUNTER — Other Ambulatory Visit: Payer: Self-pay | Admitting: Internal Medicine

## 2015-03-03 ENCOUNTER — Telehealth: Payer: Self-pay | Admitting: Cardiology

## 2015-03-03 ENCOUNTER — Ambulatory Visit (INDEPENDENT_AMBULATORY_CARE_PROVIDER_SITE_OTHER): Payer: 59 | Admitting: *Deleted

## 2015-03-03 DIAGNOSIS — R001 Bradycardia, unspecified: Secondary | ICD-10-CM

## 2015-03-03 DIAGNOSIS — Z950 Presence of cardiac pacemaker: Secondary | ICD-10-CM | POA: Diagnosis not present

## 2015-03-03 NOTE — Telephone Encounter (Signed)
Spoke with pt and reminded pt of remote transmission that is due today. Pt verbalized understanding.   

## 2015-03-04 NOTE — Progress Notes (Signed)
Remote pacemaker transmission.   

## 2015-03-11 LAB — CUP PACEART REMOTE DEVICE CHECK
Battery Impedance: 813 Ohm
Battery Voltage: 2.78 V
Brady Statistic AP VP Percent: 9 %
Brady Statistic AS VP Percent: 1 %
Date Time Interrogation Session: 20170302214116
Implantable Lead Implant Date: 20090316
Implantable Lead Location: 753860
Implantable Lead Model: 5076
Lead Channel Setting Pacing Amplitude: 2.5 V
Lead Channel Setting Pacing Pulse Width: 0.4 ms
MDC IDC LEAD IMPLANT DT: 20090316
MDC IDC LEAD LOCATION: 753859
MDC IDC MSMT BATTERY REMAINING LONGEVITY: 65 mo
MDC IDC MSMT LEADCHNL RA IMPEDANCE VALUE: 408 Ohm
MDC IDC MSMT LEADCHNL RV IMPEDANCE VALUE: 526 Ohm
MDC IDC SET LEADCHNL RA PACING AMPLITUDE: 2 V
MDC IDC SET LEADCHNL RV SENSING SENSITIVITY: 5.6 mV
MDC IDC STAT BRADY AP VS PERCENT: 86 %
MDC IDC STAT BRADY AS VS PERCENT: 4 %

## 2015-03-11 NOTE — Progress Notes (Signed)
Normal remote reviewed.  Next [facility] 06/02/15

## 2015-03-16 ENCOUNTER — Encounter: Payer: Self-pay | Admitting: Cardiology

## 2015-04-06 DIAGNOSIS — R972 Elevated prostate specific antigen [PSA]: Secondary | ICD-10-CM | POA: Diagnosis not present

## 2015-04-06 DIAGNOSIS — N401 Enlarged prostate with lower urinary tract symptoms: Secondary | ICD-10-CM | POA: Diagnosis not present

## 2015-04-06 DIAGNOSIS — R3916 Straining to void: Secondary | ICD-10-CM | POA: Diagnosis not present

## 2015-05-03 ENCOUNTER — Encounter: Payer: Self-pay | Admitting: *Deleted

## 2015-05-06 ENCOUNTER — Encounter: Payer: Self-pay | Admitting: Internal Medicine

## 2015-05-06 ENCOUNTER — Ambulatory Visit (INDEPENDENT_AMBULATORY_CARE_PROVIDER_SITE_OTHER): Payer: 59 | Admitting: Internal Medicine

## 2015-05-06 VITALS — BP 120/62 | HR 87 | Ht 72.0 in | Wt 209.4 lb

## 2015-05-06 DIAGNOSIS — E785 Hyperlipidemia, unspecified: Secondary | ICD-10-CM

## 2015-05-06 DIAGNOSIS — I712 Thoracic aortic aneurysm, without rupture, unspecified: Secondary | ICD-10-CM

## 2015-05-06 DIAGNOSIS — I10 Essential (primary) hypertension: Secondary | ICD-10-CM | POA: Diagnosis not present

## 2015-05-06 DIAGNOSIS — I359 Nonrheumatic aortic valve disorder, unspecified: Secondary | ICD-10-CM

## 2015-05-06 LAB — CBC
HCT: 42.8 % (ref 38.5–50.0)
HEMOGLOBIN: 15 g/dL (ref 13.2–17.1)
MCH: 30.5 pg (ref 27.0–33.0)
MCHC: 35 g/dL (ref 32.0–36.0)
MCV: 87.2 fL (ref 80.0–100.0)
MPV: 10.4 fL (ref 7.5–12.5)
PLATELETS: 145 10*3/uL (ref 140–400)
RBC: 4.91 MIL/uL (ref 4.20–5.80)
RDW: 14.2 % (ref 11.0–15.0)
WBC: 5.2 10*3/uL (ref 3.8–10.8)

## 2015-05-06 LAB — BASIC METABOLIC PANEL
BUN: 13 mg/dL (ref 7–25)
CO2: 27 mmol/L (ref 20–31)
Calcium: 9.6 mg/dL (ref 8.6–10.3)
Chloride: 105 mmol/L (ref 98–110)
Creat: 1.27 mg/dL — ABNORMAL HIGH (ref 0.70–1.25)
GLUCOSE: 97 mg/dL (ref 65–99)
POTASSIUM: 4.1 mmol/L (ref 3.5–5.3)
Sodium: 139 mmol/L (ref 135–146)

## 2015-05-06 LAB — LIPID PANEL
CHOL/HDL RATIO: 4.4 ratio (ref <=5.0)
Cholesterol: 115 mg/dL — ABNORMAL LOW (ref 125–200)
HDL: 26 mg/dL — ABNORMAL LOW (ref >=40)
LDL Cholesterol: 59 mg/dL (ref <130)
TRIGLYCERIDES: 151 mg/dL — AB (ref <150)
VLDL: 30 mg/dL (ref <30)

## 2015-05-06 NOTE — Progress Notes (Signed)
Cardiology Office Note   Date:  05/06/2015   ID:  Patrick Weaver, DOB [birth year], MRN [id number]  PCP:  Anthoney Harada, MD  Cardiologist:   Dorris Carnes, MD   Pt presents for f/u of AV dz., PVCs,  PVOD    History of Present Illness: Patrick Weaver is a 70 y.o. male with a history of PVCs, mildly dilated aorta., CP, bradycardia He is s/p PPM I saw the pt 1 year ago    Since seen he says that if he  walks too fast or too far he gets  chest tightness   No change  About like it has been  Notes occasional flutter in chest    Now he is developed shingles  Bothered by severe pain  Taking motrin, tylenol and gabapentin     Outpatient Prescriptions Prior to Visit  Medication Sig Dispense Refill  . acetaminophen (TYLENOL) 325 MG tablet Take 2 tablets (650 mg total) by mouth every 6 (six) hours as needed for mild pain (or Fever >/= 101). 30 tablet 0  . amLODipine (NORVASC) 5 MG tablet TAKE 1 TABLET BY MOUTH ONCE DAILY 90 tablet 3  . atorvastatin (LIPITOR) 10 MG tablet TAKE 1 TABLET (10 MG TOTAL) BY MOUTH DAILY. 90 tablet 1  . carvedilol (COREG) 6.25 MG tablet TAKE 1 TABLET BY MOUTH TWICE A DAY 180 tablet 2  . docusate sodium (COLACE) 100 MG capsule Take 1 capsule (100 mg total) by mouth 2 (two) times daily. 30 capsule 0  . flecainide (TAMBOCOR) 150 MG tablet Take 1/2 tablet by mouth twice a day    . flecainide (TAMBOCOR) 150 MG tablet TAKE 1/2 TABLET 2 TIMES A DAY 90 tablet 3   No facility-administered medications prior to visit.     Allergies:   Codeine   Past Medical History  Diagnosis Date  . Palpitations   . Dyslipidemia     failed niaspan  . Bradycardia     treated with pacemaker placement- Dr. Virl Axe MD  . HTN (hypertension)   . PVCs (premature ventricular contractions)   . Pacemaker reprogramming/check   . PACEMAKER-[employer] 08/31/2008    Qualifier: Diagnosis of  By: Caryl Comes, MD, Remus Blake   . Bladder stone   . BPH (benign prostatic hyperplasia)     . Sleep apnea     USES C-PAP    Past Surgical History  Procedure Laterality Date  . Ep implantable device  2009  . Tonsillectomy    . Cystoscopy with litholapaxy N/A 07/12/2014    Procedure: CYSTOSCOPY WITH LITHOLAPAXY;  Surgeon: Raynelle Bring, MD;  Location: [facility];  Service: Urology;  Laterality: N/A;  . Transurethral resection of prostate N/A 07/12/2014    Procedure: TRANSURETHRAL RESECTION OF THE PROSTATE (TURP);  Surgeon: Raynelle Bring, MD;  Location: [facility];  Service: Urology;  Laterality: N/A;     Social History:  The patient  reports that he has never smoked. He has never used smokeless tobacco. He reports that he does not drink alcohol or use illicit drugs.   Family History:  The patient's family history includes Cancer in his mother; Colon cancer in his maternal grandfather; Congestive Heart Failure in his father; Heart attack in his father; Heart disease in his father; Hypertension in his brother and mother. There is no history of Stomach cancer or Esophageal cancer.    ROS:  Please see the history of present illness. All other systems are reviewed and  Negative to the above problem except  as noted.    PHYSICAL EXAM: VS:  BP 120/62 mmHg  Pulse 87  Ht 6' (1.829 m)  Wt 209 lb 6.4 oz (94.983 kg)  BMI 28.39 kg/m2  GEN: Well nourished, well developed, in no acute distress HEENT: normal Neck: no JVD, carotid bruits, or masses Cardiac: RRR; Gr II/VI systolic murmur Base  Gr I/VI diastolic mrumur, rubs, or gallops,no edema  Respiratory:  clear to auscultation bilaterally, normal work of breathing GI: soft, nontender, nondistended, + BS  No hepatomegaly  MS: no deformity Moving all extremities   Skin: warm and dry, Rash along L back and stomach consistent with shingles   Neuro:  Strength and sensation are intact Psych: euthymic mood, full affect   EKG:  EKG is not  ordered today.   Lipid Panel    Component Value Date/Time   CHOL 104 10/30/2013 0947   TRIG 131.0  10/30/2013 0947   HDL 23.70* 10/30/2013 0947   CHOLHDL 4 10/30/2013 0947   VLDL 26.2 10/30/2013 0947   LDLCALC 54 10/30/2013 0947   LDLDIRECT 98.6 05/30/2006 0856      Wt Readings from Last 3 Encounters:  05/06/15 209 lb 6.4 oz (94.983 kg)  09/02/14 207 lb 12.8 oz (94.257 kg)  07/12/14 213 lb 10 oz (96.9 kg)      ASSESSMENT AND PLAN: 1   AV dz  Will set up for echo  2  Thoracic aortic aneurysm  Set up for repeat CT  3.  Bradycardai  S/p PPM  Follows with S Klein in fall  4  PVC   Continue flecanide  5  HL  Continue on Lipitor  Will check CBC, BMET, lipid panel  F/U with me in 1 year     Signed, Dorris Carnes, MD  05/06/2015 8:12 AM    [address], [place], [place]  [address] Phone: [phone]; Fax: [phone]

## 2015-05-06 NOTE — Patient Instructions (Signed)
Your physician recommends that you continue on your current medications as directed. Please refer to the Current Medication list given to you today. Your physician recommends that you return for lab work in: TODAY (bmet, cbc, lipids)  PLEASE CALL TO SCHEDULE AN ECHOCARDIOGRAM AND A CT SCAN OF YOUR CHEST TO LOOK AT [place]. WAIT UNTIL YOUR SHINGLES/PAIN HAVE RESOLVED.

## 2015-06-02 ENCOUNTER — Telehealth: Payer: Self-pay | Admitting: Cardiology

## 2015-06-02 ENCOUNTER — Encounter: Payer: 59 | Admitting: *Deleted

## 2015-06-02 NOTE — Telephone Encounter (Signed)
Spoke with pt and reminded pt of remote transmission that is due today. Pt verbalized understanding.   

## 2015-06-03 ENCOUNTER — Encounter: Payer: Self-pay | Admitting: Cardiology

## 2015-06-13 ENCOUNTER — Telehealth: Payer: Self-pay | Admitting: Internal Medicine

## 2015-06-13 NOTE — Telephone Encounter (Signed)
New message      Pt has a bipap machine.  Dr Gwenette Greet was followering him but he left his practice.  Wife want to know if Dr [name] will follow pt?

## 2015-06-13 NOTE — Telephone Encounter (Signed)
Spoke with patient's wife who states he has been tired during the day and waking up SOB and states his bipap is older and does not auto titrate. Discussed with Dr. Theodosia Blender nurse.  Appointment made for 6/15 at 9:45 am.  Patient's wife is aware of this appointment date/time and is appreciative for the assistance.

## 2015-06-16 ENCOUNTER — Ambulatory Visit (INDEPENDENT_AMBULATORY_CARE_PROVIDER_SITE_OTHER): Payer: 59 | Admitting: Cardiology

## 2015-06-16 ENCOUNTER — Encounter: Payer: Self-pay | Admitting: Cardiology

## 2015-06-16 VITALS — BP 120/62 | HR 57 | Ht 72.0 in | Wt 211.4 lb

## 2015-06-16 DIAGNOSIS — I10 Essential (primary) hypertension: Secondary | ICD-10-CM | POA: Diagnosis not present

## 2015-06-16 DIAGNOSIS — G4730 Sleep apnea, unspecified: Secondary | ICD-10-CM

## 2015-06-16 NOTE — Patient Instructions (Signed)
Medication Instructions:  Your physician recommends that you continue on your current medications as directed. Please refer to the Current Medication list given to you today.   Labwork: None  Testing/Procedures: None  Follow-Up: Your physician wants you to follow-up in: 1 year with Dr. Radford Pax. You will receive a reminder letter in the mail two months in advance. If you don't receive a letter, please call our office to schedule the follow-up appointment.   Any Other Special Instructions Will Be Listed Below (If Applicable). You have been ordered a new BiPAP.  Romelle Starcher is our office PAP assistant. Please contact her directly at [phone].    If you need a refill on your cardiac medications before your next appointment, please call your pharmacy.

## 2015-06-16 NOTE — Progress Notes (Signed)
Cardiology Office Note    Date:  06/16/2015   ID:  Patrick Weaver, DOB 10/18/1945, MRN [id number]  PCP:  Anthoney Harada, MD  Cardiologist:  Fransico Him, MD   Chief Complaint  Patient presents with  . Sleep Apnea    History of Present Illness:  Patrick Weaver is a 70 y.o. male with a history of OSA on CPAP, HTN and PVC's.  He had been followed by Dr. Gwenette Greet in the past until he moved. He was initially on CPAP and switched to BiPAP to try to improve compliance and improve pressure tolerance.  He has also taken a sedative hyponotic in the past as well to help with sleep.   He is now here to establish sleep therapy care.  He tolerates his CPAP device without any problems.  He tolerates the full face mask but feels the pressure is not high enough.  He somewhat  feels rested in the am but not as well as it had been when he first used it but he has not been sleeping well for the past few months due to shingles.  He feels sleepy during the day but attributes it to the gabapentin for the shingles.  He denies any mouth dryness or nasal congestion.  He does not snore.    Past Medical History  Diagnosis Date  . Palpitations   . Dyslipidemia     failed niaspan  . Bradycardia     treated with pacemaker placement- Dr. Virl Axe MD  . HTN (hypertension)   . PVCs (premature ventricular contractions)   . Pacemaker reprogramming/check   . PACEMAKER-[employer] 08/31/2008    Qualifier: Diagnosis of  By: Caryl Comes, MD, Remus Blake   . Bladder stone   . BPH (benign prostatic hyperplasia)   . Sleep apnea     USES C-PAP    Past Surgical History  Procedure Laterality Date  . Ep implantable device  2009  . Tonsillectomy    . Cystoscopy with litholapaxy N/A 07/12/2014    Procedure: CYSTOSCOPY WITH LITHOLAPAXY;  Surgeon: Raynelle Bring, MD;  Location: [facility];  Service: Urology;  Laterality: N/A;  . Transurethral resection of prostate N/A 07/12/2014    Procedure: TRANSURETHRAL RESECTION OF  THE PROSTATE (TURP);  Surgeon: Raynelle Bring, MD;  Location: [facility];  Service: Urology;  Laterality: N/A;    Current Medications: Outpatient Prescriptions Prior to Visit  Medication Sig Dispense Refill  . acetaminophen (TYLENOL) 325 MG tablet Take 2 tablets (650 mg total) by mouth every 6 (six) hours as needed for mild pain (or Fever >/= 101). 30 tablet 0  . amLODipine (NORVASC) 5 MG tablet TAKE 1 TABLET BY MOUTH ONCE DAILY 90 tablet 3  . atorvastatin (LIPITOR) 10 MG tablet TAKE 1 TABLET (10 MG TOTAL) BY MOUTH DAILY. 90 tablet 1  . carvedilol (COREG) 6.25 MG tablet TAKE 1 TABLET BY MOUTH TWICE A DAY 180 tablet 2  . docusate sodium (COLACE) 100 MG capsule Take 1 capsule (100 mg total) by mouth 2 (two) times daily. 30 capsule 0  . flecainide (TAMBOCOR) 150 MG tablet Take 1/2 tablet by mouth twice a day    . gabapentin (NEURONTIN) 300 MG capsule Reported on 06/16/2015  0  . triamcinolone cream (KENALOG) 0.1 % Reported on 06/16/2015  0   No facility-administered medications prior to visit.     Allergies:   Codeine   Social History   Social History  . Marital Status: Married    Spouse Name: N/A  .  Number of Children: 3  . Years of Education: N/A   Social History Main Topics  . Smoking status: Never Smoker   . Smokeless tobacco: Never Used  . Alcohol Use: No  . Drug Use: No  . Sexual Activity: Not Asked   Other Topics Concern  . None   Social History Narrative   Married, 3 daughters. 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 Drinks 3 caffeinated beverages/day.      Family History:  The patient's family history includes Cancer in his mother; Colon cancer in his maternal grandfather; Congestive Heart Failure in his father; Heart attack in his father; Heart disease in his father; Hypertension in his brother and mother. There is no history of Stomach cancer or Esophageal cancer.   ROS:   Please see the history of present illness.    ROS All other systems reviewed and are  negative.   PHYSICAL EXAM:   VS:  BP 120/62 mmHg  Pulse 57  Ht 6' (1.829 m)  Wt 211 lb 6.4 oz (95.89 kg)  BMI 28.66 kg/m2   GEN: Well nourished, well developed, in no acute distress HEENT: normal Neck: no JVD, carotid bruits, or masses Cardiac: RRR; no murmurs, rubs, or gallops,no edema.  Intact distal pulses bilaterally.  Respiratory:  clear to auscultation bilaterally, normal work of breathing GI: soft, nontender, nondistended, + BS MS: no deformity or atrophy Skin: warm and dry, no rash Neuro:  Alert and Oriented x 3, Strength and sensation are intact Psych: euthymic mood, full affect  Wt Readings from Last 3 Encounters:  06/16/15 211 lb 6.4 oz (95.89 kg)  05/06/15 209 lb 6.4 oz (94.983 kg)  09/02/14 207 lb 12.8 oz (94.257 kg)      Studies/Labs Reviewed:   EKG:  EKG is not ordered today.    Recent Labs: 06/25/2014: ALT 19 05/06/2015: BUN 13; Creat 1.27*; Hemoglobin 15.0; Platelets 145; Potassium 4.1; Sodium 139   Lipid Panel    Component Value Date/Time   CHOL 115* 05/06/2015 0839   TRIG 151* 05/06/2015 0839   HDL 26* 05/06/2015 0839   CHOLHDL 4.4 05/06/2015 0839   VLDL 30 05/06/2015 0839   LDLCALC 59 05/06/2015 0839   LDLDIRECT 98.6 05/30/2006 0856    Additional studies/ records that were reviewed today include:  none    ASSESSMENT:    1. Sleep apnea   2. Essential hypertension      PLAN:  In order of problems listed above:  OSA - the patient is tolerating PAP therapy well without any problems. The patient has been using and benefiting from PAP use and will continue to benefit from therapy. He does not have an SD card to get a download from.  He uses [employer] for his supplies. His device is 68 year old so I am going to get him a new one so we can monitor his compliance and AHI.  Patient has been using and benefiting from CPAP use and will continue to benefit from therapy.  HTN - BP controlled on current medical regimen. Continue  amlodipine/BB     Medication Adjustments/Labs and Tests Ordered: Current medicines are reviewed at length with the patient today.  Concerns regarding medicines are outlined above.  Medication changes, Labs and Tests ordered today are listed in the Patient Instructions below.  There are no Patient Instructions on file for this visit.   Signed, Fransico Him, MD  06/16/2015 10:02 AM    [address],  [place], [place]  [address] Phone: [phone]; Fax: [phone]

## 2015-07-07 ENCOUNTER — Other Ambulatory Visit: Payer: Self-pay | Admitting: *Deleted

## 2015-07-07 DIAGNOSIS — I10 Essential (primary) hypertension: Secondary | ICD-10-CM

## 2015-07-08 ENCOUNTER — Other Ambulatory Visit: Payer: Self-pay | Admitting: *Deleted

## 2015-07-08 DIAGNOSIS — G4733 Obstructive sleep apnea (adult) (pediatric): Secondary | ICD-10-CM

## 2015-07-22 ENCOUNTER — Other Ambulatory Visit: Payer: Self-pay

## 2015-07-22 ENCOUNTER — Other Ambulatory Visit: Payer: Self-pay | Admitting: *Deleted

## 2015-07-22 ENCOUNTER — Other Ambulatory Visit (INDEPENDENT_AMBULATORY_CARE_PROVIDER_SITE_OTHER): Payer: 59 | Admitting: *Deleted

## 2015-07-22 ENCOUNTER — Ambulatory Visit (HOSPITAL_COMMUNITY): Payer: 59 | Attending: Cardiovascular Disease

## 2015-07-22 DIAGNOSIS — I119 Hypertensive heart disease without heart failure: Secondary | ICD-10-CM | POA: Insufficient documentation

## 2015-07-22 DIAGNOSIS — G4730 Sleep apnea, unspecified: Secondary | ICD-10-CM | POA: Insufficient documentation

## 2015-07-22 DIAGNOSIS — I359 Nonrheumatic aortic valve disorder, unspecified: Secondary | ICD-10-CM | POA: Insufficient documentation

## 2015-07-22 DIAGNOSIS — I10 Essential (primary) hypertension: Secondary | ICD-10-CM

## 2015-07-22 DIAGNOSIS — E785 Hyperlipidemia, unspecified: Secondary | ICD-10-CM | POA: Diagnosis not present

## 2015-07-22 DIAGNOSIS — I712 Thoracic aortic aneurysm, without rupture, unspecified: Secondary | ICD-10-CM

## 2015-07-22 DIAGNOSIS — I351 Nonrheumatic aortic (valve) insufficiency: Secondary | ICD-10-CM | POA: Diagnosis not present

## 2015-07-22 LAB — BASIC METABOLIC PANEL
BUN: 15 mg/dL (ref 7–25)
CALCIUM: 8.7 mg/dL (ref 8.6–10.3)
CO2: 25 mmol/L (ref 20–31)
Chloride: 106 mmol/L (ref 98–110)
Creat: 1.25 mg/dL (ref 0.70–1.25)
GLUCOSE: 96 mg/dL (ref 65–99)
Potassium: 3.9 mmol/L (ref 3.5–5.3)
SODIUM: 141 mmol/L (ref 135–146)

## 2015-07-22 MED ORDER — CARVEDILOL 6.25 MG PO TABS: 6.2500 mg | ORAL_TABLET | Freq: Two times a day (BID) | ORAL | Status: DC

## 2015-07-22 NOTE — Addendum Note (Signed)
Addended by: Eulis Foster on: 07/22/2015 08:05 AM   Modules accepted: Orders

## 2015-07-28 ENCOUNTER — Ambulatory Visit (INDEPENDENT_AMBULATORY_CARE_PROVIDER_SITE_OTHER): Payer: 59 | Admitting: Internal Medicine

## 2015-07-28 ENCOUNTER — Ambulatory Visit (INDEPENDENT_AMBULATORY_CARE_PROVIDER_SITE_OTHER)
Admission: RE | Admit: 2015-07-28 | Discharge: 2015-07-28 | Disposition: A | Discharge status: Home / Self Care | Payer: 59 | Source: Ambulatory Visit | Attending: Internal Medicine | Admitting: Internal Medicine

## 2015-07-28 ENCOUNTER — Ambulatory Visit (INDEPENDENT_AMBULATORY_CARE_PROVIDER_SITE_OTHER): Payer: 59 | Admitting: *Deleted

## 2015-07-28 VITALS — BP 123/79

## 2015-07-28 DIAGNOSIS — I10 Essential (primary) hypertension: Secondary | ICD-10-CM | POA: Diagnosis not present

## 2015-07-28 DIAGNOSIS — I495 Sick sinus syndrome: Secondary | ICD-10-CM | POA: Diagnosis not present

## 2015-07-28 DIAGNOSIS — E785 Hyperlipidemia, unspecified: Secondary | ICD-10-CM

## 2015-07-28 DIAGNOSIS — I359 Nonrheumatic aortic valve disorder, unspecified: Secondary | ICD-10-CM

## 2015-07-28 DIAGNOSIS — I712 Thoracic aortic aneurysm, without rupture, unspecified: Secondary | ICD-10-CM

## 2015-07-28 DIAGNOSIS — I498 Other specified cardiac arrhythmias: Secondary | ICD-10-CM

## 2015-07-28 LAB — CUP PACEART INCLINIC DEVICE CHECK
Battery Impedance: 1027 Ohm
Brady Statistic AP VP Percent: 9 %
Brady Statistic AP VS Percent: 83 %
Date Time Interrogation Session: 20170727133243
Implantable Lead Implant Date: 20090316
Implantable Lead Location: 753859
Implantable Lead Location: 753860
Lead Channel Pacing Threshold Amplitude: 0.75 V
Lead Channel Pacing Threshold Pulse Width: 0.4 ms
Lead Channel Pacing Threshold Pulse Width: 0.4 ms
Lead Channel Sensing Intrinsic Amplitude: 2 mV
Lead Channel Setting Pacing Amplitude: 2.5 V
Lead Channel Setting Pacing Pulse Width: 0.4 ms
MDC IDC LEAD IMPLANT DT: 20090316
MDC IDC MSMT BATTERY REMAINING LONGEVITY: 56 mo
MDC IDC MSMT BATTERY VOLTAGE: 2.78 V
MDC IDC MSMT LEADCHNL RA IMPEDANCE VALUE: 366 Ohm
MDC IDC MSMT LEADCHNL RA PACING THRESHOLD AMPLITUDE: 0.375 V
MDC IDC MSMT LEADCHNL RA PACING THRESHOLD AMPLITUDE: 0.5 V
MDC IDC MSMT LEADCHNL RA PACING THRESHOLD PULSEWIDTH: 0.4 ms
MDC IDC MSMT LEADCHNL RV IMPEDANCE VALUE: 475 Ohm
MDC IDC MSMT LEADCHNL RV PACING THRESHOLD AMPLITUDE: 0.625 V
MDC IDC MSMT LEADCHNL RV PACING THRESHOLD PULSEWIDTH: 0.4 ms
MDC IDC MSMT LEADCHNL RV SENSING INTR AMPL: 11.2 mV
MDC IDC SET LEADCHNL RA PACING AMPLITUDE: 2 V
MDC IDC SET LEADCHNL RV SENSING SENSITIVITY: 4 mV
MDC IDC STAT BRADY AS VP PERCENT: 1 %
MDC IDC STAT BRADY AS VS PERCENT: 6 %

## 2015-07-28 MED ORDER — IOPAMIDOL (ISOVUE-370) INJECTION 76%
100.0000 mL | Freq: Once | INTRAVENOUS | Status: AC | PRN
  Administered 2015-07-28: 100 mL via INTRAVENOUS

## 2015-07-28 NOTE — Progress Notes (Signed)
Pacemaker check in clinic per Dr.Ross. Normal device function. Thresholds, sensing, impedances consistent with previous measurements. Device programmed to maximize longevity. <0.1% AT/AF burden--1 AHR episode x 1 min @ 282/132. (7) high ventricular rates noted--VT-NS per available EGMs. Device programmed at appropriate safety margins. Histogram distribution appropriate for patient activity level. Device programmed to optimize intrinsic conduction. Estimated longevity 4.5 years. Patient will follow up as scheduled.

## 2015-07-31 NOTE — Progress Notes (Signed)
Cardiology Office Note   Date:  07/31/2015   ID:  Patrick Weaver, DOB 08-Apr-1945, MRN [id number]  PCP:  Anthoney Harada, MD  Cardiologist:   Dorris Carnes, MD   Pt presents for evaluation of LV dysfunction   History of Present Illness: Patrick Weaver is a 70 y.o. male with a history of PVCs, mildly dielated aorta, CP, bradycardia  He is s/p PPM I saw him in clinic in May  He is also followed by [name]   He underwent echo to evaluate aorta  This showed LVEF was 25 to 30% which was signif different from 2015 echo when LVEF was 50 to 55%  Aorta rel unchanged   The pt has had cath in the past that showed no signif dz  He notes some SOB with activity  He has had for a long time  Does get some chest tightness  Rests and eases   No symptoms at rest  Cath showed 25% acute marginalof RCA  Mod AI  Aortic insufficitency appears mild to mod on echo     Outpatient Medications Prior to Visit  Medication Sig Dispense Refill  . acetaminophen (TYLENOL) 325 MG tablet Take 2 tablets (650 mg total) by mouth every 6 (six) hours as needed for mild pain (or Fever >/= 101). 30 tablet 0  . amLODipine (NORVASC) 5 MG tablet TAKE 1 TABLET BY MOUTH ONCE DAILY 90 tablet 3  . atorvastatin (LIPITOR) 10 MG tablet TAKE 1 TABLET (10 MG TOTAL) BY MOUTH DAILY. 90 tablet 1  . carvedilol (COREG) 6.25 MG tablet Take 1 tablet (6.25 mg total) by mouth 2 (two) times daily. Please call and schedule a one year follow up appointment 180 tablet 0  . docusate sodium (COLACE) 100 MG capsule Take 1 capsule (100 mg total) by mouth 2 (two) times daily. 30 capsule 0  . flecainide (TAMBOCOR) 150 MG tablet Take 1/2 tablet by mouth twice a day     No facility-administered medications prior to visit.      Allergies:   Codeine   Past Medical History:  Diagnosis Date  . Bladder stone   . BPH (benign prostatic hyperplasia)   . Bradycardia    treated with pacemaker placement- Dr. Virl Axe MD  . Dyslipidemia    failed  niaspan  . HTN (hypertension)   . Pacemaker reprogramming/check   . PACEMAKER-[employer] 08/31/2008   Qualifier: Diagnosis of  By: Caryl Comes, MD, Remus Blake   . Palpitations   . PVCs (premature ventricular contractions)   . Sleep apnea    USES C-PAP    Past Surgical History:  Procedure Laterality Date  . CYSTOSCOPY WITH LITHOLAPAXY N/A 07/12/2014   Procedure: CYSTOSCOPY WITH LITHOLAPAXY;  Surgeon: Raynelle Bring, MD;  Location: [facility];  Service: Urology;  Laterality: N/A;  . EP IMPLANTABLE DEVICE  2009  . TONSILLECTOMY    . TRANSURETHRAL RESECTION OF PROSTATE N/A 07/12/2014   Procedure: TRANSURETHRAL RESECTION OF THE PROSTATE (TURP);  Surgeon: Raynelle Bring, MD;  Location: [facility];  Service: Urology;  Laterality: N/A;     Social History:  The patient  reports that he has never smoked. He has never used smokeless tobacco. He reports that he does not drink alcohol or use drugs.   Family History:  The patient's family history includes Cancer in his mother; Colon cancer in his maternal grandfather; Congestive Heart Failure in his father; Heart attack in his father; Heart disease in his father; Hypertension in his brother and  mother.    ROS:  Please see the history of present illness. All other systems are reviewed and  Negative to the above problem except as noted.    PHYSICAL EXAM: VS:  BP 123/79 (BP Location: Right Arm, Patient Position: Sitting, Cuff Size: Normal)   GEN: Well nourished, well developed, in no acute distress  HEENT: normal  Neck: no JVD, carotid bruits, or masses Cardiac: RRR; no murmurs, rubs, or gallops,no edema  Respiratory:  clear to auscultation bilaterally, normal work of breathing GI: soft, nontender, nondistended, + BS  No hepatomegaly  MS: no deformity Moving all extremities   Skin: warm and dry, no rash Neuro:  Strength and sensation are intact Psych: euthymic mood, full affect   EKG:  EKG is ordered today.  AV sequenctial pacing Occasional PVC      Lipid Panel    Component Value Date/Time   CHOL 115 (L) 05/06/2015 0839   TRIG 151 (H) 05/06/2015 0839   HDL 26 (L) 05/06/2015 0839   CHOLHDL 4.4 05/06/2015 0839   VLDL 30 05/06/2015 0839   LDLCALC 59 05/06/2015 0839   LDLDIRECT 98.6 05/30/2006 0856      Wt Readings from Last 3 Encounters:  06/16/15 211 lb 6.4 oz (95.9 kg)  05/06/15 209 lb 6.4 oz (95 kg)  09/02/14 207 lb 12.8 oz (94.3 kg)      ASSESSMENT AND PLAN:  1  Chronic systolic CHF  Volume status looks good   I am not sure what has lead tochange  Interroation of pacer shows LV is paced about 10% of the time He has symptoms of DOE and some chest tightness  These are not new  He has had for a long time  No signif dz at cath in 2014   I would keep on same meds for now  I will review with [name] next week when he returns to clinic   2.  HTN  Keep on same meds    3.  AI  Continue to follow    Signed, Dorris Carnes, MD  07/31/2015 12:23 AM    [address], [place], [place]  [address] Phone: [phone]; Fax: [phone]

## 2015-08-01 ENCOUNTER — Telehealth: Payer: Self-pay | Admitting: Internal Medicine

## 2015-08-01 DIAGNOSIS — R0789 Other chest pain: Secondary | ICD-10-CM

## 2015-08-01 DIAGNOSIS — R0602 Shortness of breath: Secondary | ICD-10-CM

## 2015-08-01 DIAGNOSIS — I493 Ventricular premature depolarization: Secondary | ICD-10-CM

## 2015-08-01 DIAGNOSIS — Z79899 Other long term (current) drug therapy: Secondary | ICD-10-CM

## 2015-08-01 NOTE — Telephone Encounter (Signed)
Received call from Dr. [name].  Pt called her back and discussed that he prefers to not take flecanide OR amiodarone.   Because of this, he will not need lab work or PFTs.  Dr. [name] will follow and will discuss with Dr. Caryl Comes when next pacer interrogation should be.

## 2015-08-01 NOTE — Telephone Encounter (Signed)
Called pt   I have contacted [name] and reviewed Pt is still having frequent PVCs  10000 per day This may explain decrease in LVEF Would 1  Stop Flecanide 2 Start amiodarone  400 bid for 2 wks then 200 bid for 2 wks then 200 mg per day  3  Needs LFTs and TSH 4  Needs PFTs  5  F/U in a few months with pacer interrogation

## 2015-08-03 ENCOUNTER — Encounter: Payer: Self-pay | Admitting: Internal Medicine

## 2015-08-03 MED ORDER — AMIODARONE HCL 200 MG PO TABS: 200.0000 mg | ORAL_TABLET | Freq: Every day | ORAL | 3 refills | Status: DC

## 2015-08-03 MED ORDER — AMIODARONE HCL 200 MG PO TABS: 400.0000 mg | ORAL_TABLET | Freq: Two times a day (BID) | ORAL | 0 refills | Status: DC

## 2015-08-03 NOTE — Telephone Encounter (Signed)
Follow-up      The pt wants to speak with Dr. Alan Ripper nurse no other information provided

## 2015-08-03 NOTE — Telephone Encounter (Signed)
Spoke with patient. He called asking for appointment with Dr. Caryl Comes to discuss if there is something else he could take instead of amiodarone.  Advised that I would check with his scheduler and we would call him with appointment.  (he is due to be sent his 13 m recall letter.  We reviewed that Dr. [name] discussed with Dr. Caryl Comes and what the recommendations were per this phone encounter.  Pt is nervous to start the amiodarone but also is worried his heart "will get worse" if he waits.   He will come on Friday for his LFP and TSH.  Placed order for PFTs.    He plans to start the amiodarone since this was the recommendation.  He is aware someone will call to schedule PFTs.

## 2015-08-05 ENCOUNTER — Other Ambulatory Visit: Payer: 59 | Admitting: *Deleted

## 2015-08-05 ENCOUNTER — Encounter (INDEPENDENT_AMBULATORY_CARE_PROVIDER_SITE_OTHER): Payer: Self-pay

## 2015-08-05 DIAGNOSIS — I493 Ventricular premature depolarization: Secondary | ICD-10-CM

## 2015-08-05 DIAGNOSIS — R0602 Shortness of breath: Secondary | ICD-10-CM

## 2015-08-05 DIAGNOSIS — R0789 Other chest pain: Secondary | ICD-10-CM

## 2015-08-05 DIAGNOSIS — Z79899 Other long term (current) drug therapy: Secondary | ICD-10-CM

## 2015-08-05 LAB — TSH: TSH: 2.15 mIU/L (ref 0.40–4.50)

## 2015-08-05 LAB — HEPATIC FUNCTION PANEL
ALK PHOS: 81 U/L (ref 40–115)
ALT: 23 U/L (ref 9–46)
AST: 20 U/L (ref 10–35)
Albumin: 4.5 g/dL (ref 3.6–5.1)
BILIRUBIN INDIRECT: 0.7 mg/dL (ref 0.2–1.2)
Bilirubin, Direct: 0.2 mg/dL (ref <=0.2)
TOTAL PROTEIN: 6.5 g/dL (ref 6.1–8.1)
Total Bilirubin: 0.9 mg/dL (ref 0.2–1.2)

## 2015-08-08 ENCOUNTER — Telehealth: Payer: Self-pay | Admitting: Internal Medicine

## 2015-08-08 NOTE — Telephone Encounter (Signed)
Notified the pt that Dr [name] has the order for him to have a PFT done at the hospital, and our office will be calling him in the very near future to have this scheduled for him to have this done over at the hospital.  Pt verbalized understanding and agrees with this plan.  Will send [facility] scheduling a message to follow-up with this pt.

## 2015-08-08 NOTE — Telephone Encounter (Signed)
Patrick Weaver is calling because he was to be set up for a lung test , wants to know if he is supposed to call or was we supposed to . Please call   Thanks

## 2015-08-11 ENCOUNTER — Other Ambulatory Visit: Payer: Self-pay | Admitting: Internal Medicine

## 2015-08-11 ENCOUNTER — Telehealth: Payer: Self-pay | Admitting: Internal Medicine

## 2015-08-11 NOTE — Telephone Encounter (Signed)
New message     Pt wife wants to talk about some of the symptoms that the pt is having and about starting amiodarione.  Please call.

## 2015-08-11 NOTE — Telephone Encounter (Signed)
I called and spoke with Mrs. Marden Noble (DPR). She states that she and the patient have been doing some research on the amiodarone. At first, he was not on board with starting this, but he is currently agreeable with this after some research. Dr. [name] had already sent in his RX for him to start 400 mg BID x 2 weeks, then 200 mg BID x 2 weeks, then 200 mg once daily. He has had baseline LFT's/ TSH done on 8/4. He is scheduled for PFT's tomorrow. I have advised the patient's wife to have him go ahead and start the drug tomorrow after his PFT's are done.   will clarify with Dr. Caryl Comes 1) when does he want him to f/u 2) will he need a holter at all prior to f/u or will device interrogation be sufficient.  I will call Mrs. Dyce back once recommendations are received.  She is agreeable.

## 2015-08-11 NOTE — Telephone Encounter (Signed)
I agree with amio Will need repeat echo in 3 month And repeat holter in 2 months And I will see him in 3 mon

## 2015-08-12 ENCOUNTER — Encounter (HOSPITAL_COMMUNITY): Payer: Self-pay | Admitting: Emergency Medicine

## 2015-08-12 ENCOUNTER — Telehealth: Payer: Self-pay | Admitting: Internal Medicine

## 2015-08-12 ENCOUNTER — Other Ambulatory Visit: Payer: Self-pay

## 2015-08-12 ENCOUNTER — Inpatient Hospital Stay (HOSPITAL_COMMUNITY)
Admission: EM | Admit: 2015-08-12 | Discharge: 2015-08-15 | DRG: 308 | Disposition: A | Discharge status: Home / Self Care | Payer: 59 | Attending: Cardiology | Admitting: Cardiology

## 2015-08-12 ENCOUNTER — Ambulatory Visit (HOSPITAL_COMMUNITY)
Admission: RE | Admit: 2015-08-12 | Discharge: 2015-08-12 | Disposition: A | Discharge status: Home / Self Care | Payer: 59 | Source: Ambulatory Visit | Attending: Internal Medicine | Admitting: Internal Medicine

## 2015-08-12 ENCOUNTER — Emergency Department (HOSPITAL_COMMUNITY): Payer: 59

## 2015-08-12 DIAGNOSIS — Z950 Presence of cardiac pacemaker: Secondary | ICD-10-CM | POA: Diagnosis not present

## 2015-08-12 DIAGNOSIS — I712 Thoracic aortic aneurysm, without rupture, unspecified: Secondary | ICD-10-CM | POA: Diagnosis present

## 2015-08-12 DIAGNOSIS — G4733 Obstructive sleep apnea (adult) (pediatric): Secondary | ICD-10-CM | POA: Diagnosis present

## 2015-08-12 DIAGNOSIS — I5021 Acute systolic (congestive) heart failure: Secondary | ICD-10-CM | POA: Diagnosis present

## 2015-08-12 DIAGNOSIS — Z79899 Other long term (current) drug therapy: Secondary | ICD-10-CM

## 2015-08-12 DIAGNOSIS — I359 Nonrheumatic aortic valve disorder, unspecified: Secondary | ICD-10-CM | POA: Diagnosis not present

## 2015-08-12 DIAGNOSIS — I493 Ventricular premature depolarization: Secondary | ICD-10-CM

## 2015-08-12 DIAGNOSIS — I43 Cardiomyopathy in diseases classified elsewhere: Secondary | ICD-10-CM

## 2015-08-12 DIAGNOSIS — G4730 Sleep apnea, unspecified: Secondary | ICD-10-CM | POA: Diagnosis present

## 2015-08-12 DIAGNOSIS — I110 Hypertensive heart disease with heart failure: Secondary | ICD-10-CM | POA: Diagnosis present

## 2015-08-12 DIAGNOSIS — R0789 Other chest pain: Secondary | ICD-10-CM

## 2015-08-12 DIAGNOSIS — R0602 Shortness of breath: Secondary | ICD-10-CM | POA: Diagnosis not present

## 2015-08-12 DIAGNOSIS — R0609 Other forms of dyspnea: Secondary | ICD-10-CM | POA: Diagnosis not present

## 2015-08-12 DIAGNOSIS — I5189 Other ill-defined heart diseases: Secondary | ICD-10-CM

## 2015-08-12 DIAGNOSIS — E785 Hyperlipidemia, unspecified: Secondary | ICD-10-CM | POA: Diagnosis present

## 2015-08-12 DIAGNOSIS — I10 Essential (primary) hypertension: Secondary | ICD-10-CM | POA: Diagnosis not present

## 2015-08-12 DIAGNOSIS — I351 Nonrheumatic aortic (valve) insufficiency: Secondary | ICD-10-CM | POA: Diagnosis present

## 2015-08-12 DIAGNOSIS — I429 Cardiomyopathy, unspecified: Secondary | ICD-10-CM | POA: Diagnosis present

## 2015-08-12 DIAGNOSIS — R079 Chest pain, unspecified: Secondary | ICD-10-CM

## 2015-08-12 DIAGNOSIS — R000 Tachycardia, unspecified: Secondary | ICD-10-CM

## 2015-08-12 DIAGNOSIS — R0600 Dyspnea, unspecified: Secondary | ICD-10-CM | POA: Diagnosis present

## 2015-08-12 DIAGNOSIS — I5022 Chronic systolic (congestive) heart failure: Secondary | ICD-10-CM

## 2015-08-12 DIAGNOSIS — I519 Heart disease, unspecified: Secondary | ICD-10-CM

## 2015-08-12 LAB — CBC
HEMATOCRIT: 46 % (ref 39.0–52.0)
Hemoglobin: 15.5 g/dL (ref 13.0–17.0)
MCH: 30.6 pg (ref 26.0–34.0)
MCHC: 33.7 g/dL (ref 30.0–36.0)
MCV: 90.9 fL (ref 78.0–100.0)
PLATELETS: 149 10*3/uL — AB (ref 150–400)
RBC: 5.06 MIL/uL (ref 4.22–5.81)
RDW: 13.1 % (ref 11.5–15.5)
WBC: 7.5 10*3/uL (ref 4.0–10.5)

## 2015-08-12 LAB — BASIC METABOLIC PANEL
Anion gap: 11 (ref 5–15)
BUN: 12 mg/dL (ref 6–20)
CHLORIDE: 105 mmol/L (ref 101–111)
CO2: 24 mmol/L (ref 22–32)
CREATININE: 1.17 mg/dL (ref 0.61–1.24)
Calcium: 9.8 mg/dL (ref 8.9–10.3)
GFR calc Af Amer: >60 mL/min (ref >60)
Glucose, Bld: 97 mg/dL (ref 65–99)
Potassium: 3.9 mmol/L (ref 3.5–5.1)
Sodium: 140 mmol/L (ref 135–145)

## 2015-08-12 LAB — BRAIN NATRIURETIC PEPTIDE: B NATRIURETIC PEPTIDE 5: 358 pg/mL — AB (ref 0.0–100.0)

## 2015-08-12 LAB — I-STAT TROPONIN, ED: Troponin i, poc: 0.01 ng/mL (ref 0.00–0.08)

## 2015-08-12 MED ORDER — AMIODARONE LOAD VIA INFUSION
150.0000 mg | Freq: Once | INTRAVENOUS | Status: AC
  Administered 2015-08-12: 150 mg via INTRAVENOUS
  Filled 2015-08-12: qty 83.34

## 2015-08-12 MED ORDER — ASPIRIN EC 81 MG PO TBEC
81.0000 mg | DELAYED_RELEASE_TABLET | Freq: Every day | ORAL | Status: DC
  Administered 2015-08-13 – 2015-08-15 (×3): 81 mg via ORAL
  Filled 2015-08-12 (×3): qty 1

## 2015-08-12 MED ORDER — AMIODARONE LOAD VIA INFUSION
150.0000 mg | Freq: Once | INTRAVENOUS | Status: DC
  Filled 2015-08-12: qty 83.34

## 2015-08-12 MED ORDER — ACETAMINOPHEN 325 MG PO TABS: 650.0000 mg | ORAL_TABLET | Freq: Four times a day (QID) | ORAL | Status: DC | PRN

## 2015-08-12 MED ORDER — AMLODIPINE BESYLATE 5 MG PO TABS
5.0000 mg | ORAL_TABLET | Freq: Every day | ORAL | Status: DC
  Administered 2015-08-13: 5 mg via ORAL
  Filled 2015-08-12: qty 1

## 2015-08-12 MED ORDER — AMIODARONE HCL IN DEXTROSE 360-4.14 MG/200ML-% IV SOLN
30.0000 mg/h | INTRAVENOUS | Status: DC
  Administered 2015-08-12 – 2015-08-14 (×4): 30 mg/h via INTRAVENOUS
  Filled 2015-08-12 (×3): qty 200

## 2015-08-12 MED ORDER — ATORVASTATIN CALCIUM 10 MG PO TABS
10.0000 mg | ORAL_TABLET | Freq: Every day | ORAL | Status: DC
  Administered 2015-08-13 – 2015-08-14 (×2): 10 mg via ORAL
  Filled 2015-08-12 (×2): qty 1

## 2015-08-12 MED ORDER — TAMSULOSIN HCL 0.4 MG PO CAPS
0.4000 mg | ORAL_CAPSULE | Freq: Every day | ORAL | Status: DC
  Filled 2015-08-12: qty 1

## 2015-08-12 MED ORDER — CARVEDILOL 12.5 MG PO TABS: 12.5000 mg | ORAL_TABLET | Freq: Two times a day (BID) | ORAL | Status: DC

## 2015-08-12 MED ORDER — GABAPENTIN 300 MG PO CAPS
300.0000 mg | ORAL_CAPSULE | Freq: Two times a day (BID) | ORAL | Status: DC
  Administered 2015-08-13 – 2015-08-15 (×5): 300 mg via ORAL
  Filled 2015-08-12 (×5): qty 1

## 2015-08-12 MED ORDER — AMIODARONE HCL IN DEXTROSE 360-4.14 MG/200ML-% IV SOLN
60.0000 mg/h | INTRAVENOUS | Status: DC
  Administered 2015-08-12: 60 mg/h via INTRAVENOUS
  Filled 2015-08-12 (×2): qty 200

## 2015-08-12 MED ORDER — SODIUM CHLORIDE 0.9% FLUSH
3.0000 mL | Freq: Two times a day (BID) | INTRAVENOUS | Status: DC
  Administered 2015-08-14 – 2015-08-15 (×2): 3 mL via INTRAVENOUS

## 2015-08-12 MED ORDER — CARVEDILOL 12.5 MG PO TABS
12.5000 mg | ORAL_TABLET | Freq: Two times a day (BID) | ORAL | Status: DC
  Administered 2015-08-12 – 2015-08-15 (×6): 12.5 mg via ORAL
  Filled 2015-08-12 (×4): qty 1
  Filled 2015-08-12: qty 2
  Filled 2015-08-12: qty 1

## 2015-08-12 MED ORDER — AMIODARONE HCL IN DEXTROSE 360-4.14 MG/200ML-% IV SOLN: 30.0000 mg/h | INTRAVENOUS | Status: DC

## 2015-08-12 MED ORDER — ALBUTEROL SULFATE (2.5 MG/3ML) 0.083% IN NEBU
5.0000 mg | INHALATION_SOLUTION | Freq: Once | RESPIRATORY_TRACT | Status: DC
  Filled 2015-08-12: qty 6

## 2015-08-12 MED ORDER — AMIODARONE HCL IN DEXTROSE 360-4.14 MG/200ML-% IV SOLN: 60.0000 mg/h | INTRAVENOUS | Status: DC

## 2015-08-12 NOTE — ED Notes (Signed)
MD Cardama at the bedside  

## 2015-08-12 NOTE — ED Notes (Signed)
Cardiology at bedside.

## 2015-08-12 NOTE — ED Notes (Signed)
Pt returned from X-ray.  

## 2015-08-12 NOTE — Telephone Encounter (Signed)
Pt c/o Shortness Of Breath: STAT if SOB developed within the last 24 hours or pt is noticeably SOB on the phone  1. Are you currently SOB (can you hear that pt is SOB on the phone)? Pt not on phone Tammie called from [facility] respiratory department  2. How long have you been experiencing SOB? 08/12/15 walking to respiratory department  3. Are you SOB when sitting or when up moving around? Moving around   4. Are you currently experiencing any other symptoms? Lightheaded

## 2015-08-12 NOTE — H&P (Signed)
Patient ID: [place] MRN: [id number], DOB/AGE: 05-22-1945   Admit date: 08/12/2015   Primary Physician: [place] Harada, MD Primary Cardiologist: Dr. Harrington [place] Electrophysiologist: Dr. Caryl [place]  Pt. Profile:  70 y/o male with PPM for bradycardia and h/o high PVC burden and newly diagnosed reduced LV systolic function (EF [id number]), felt to be tachy mediated, as well as h/o AI and ascending aortic aneurysm, presenting to the ED with symptomatic PVCs.   Problem List  Past Medical History:  Diagnosis Date  . Bladder stone   . BPH (benign prostatic hyperplasia)   . Bradycardia    treated with pacemaker placement- Dr. Virl Axe MD  . Dyslipidemia    failed niaspan  . HTN (hypertension)   . Pacemaker reprogramming/check   . PACEMAKER-[employer]tronic 08/31/2008   Qualifier: Diagnosis of  By: [place] Comes, MD, [place]   . Palpitations   . PVCs (premature ventricular contractions)   . Sleep apnea    USES C-PAP    Past Surgical History:  Procedure Laterality Date  . CYSTOSCOPY WITH LITHOLAPAXY N/A 07/12/2014   Procedure: CYSTOSCOPY WITH LITHOLAPAXY;  Surgeon: [place], MD;  Location: [facility];  Service: Urology;  Laterality: N/A;  . EP IMPLANTABLE DEVICE  2009  . TONSILLECTOMY    . TRANSURETHRAL RESECTION OF PROSTATE N/A 07/12/2014   Procedure: TRANSURETHRAL RESECTION OF THE PROSTATE (TURP);  Surgeon: [place], MD;  Location: [facility];  Service: Urology;  Laterality: N/A;     Allergies  Allergies  Allergen Reactions  . Codeine Nausea Only    HPI  70 y/o male with h/o symptomatic bradycardia w/p PPM. He has also had significant problems with PVCs, and was treated with Flecainide. He had a LHC in Aug 2014 which demonstrated no significant coronary disease. EF was 65%. He also has AI and an ascending aortic aneurysm. Recent chest CT 07/28/15 showed stable aortic diameter of 42 mm. 2D echo also 07/2015 showed severely reduced LVEF of 25-30%. This was significantly  reduced compared to prior study in which EF was normal at  50-55%. His AI was noted to be mild to moderate with regurgitation directed centrally in the LVOT. The left atrium was severely dilated. His primary cardiologist is Dr. Harrington [place]. Dr. Caryl [place] is his EP MD and Dr. Radford [place] follows him for OSA. He is on CAPA.   He was seen in clinic by Dr. Harrington [place] in f/u on 07/28/15. His PPM was interrogated and he was noted to have frequent PVCs. It was felt that his reduced LVEF was likely tachy mediated. As a result, his Flecainide was discontinued and he was started on amiodarone. He was instructed to take 400 mg BID x 2 weeks>>200 mg BID x 2 weeks>>200 mg daily. LFTs and TSH were checked and normal. PFTs were also arranged, scheduled for today, 08/12/15. It should also be noted that the patient and his wife had concerns regarding potential side effects of amiodarone, thus have not yet starting taking the medication. They wanted to wait until he got his PFT results before starting.   Today while walking in the the clinic office for his appointment for his PFTs, he developed severe dyspnea and left sided CP with radiation down his left harm. He notes he has been symptomatic with exertion for several days but gradually worsening. Also with dizziness and near syncope. No frank syncope. Also with palpitations. He discontinued Flecainide as directed but has not yet started amiodarone as mentioned above. EKG and telemetry shows frequent PVCs and bigeminy.  He denies dyspnea and chest pain at rest.   Home Medications  Prior to Admission medications   Medication Sig Start Date End Date Taking? Authorizing Provider  acetaminophen (TYLENOL) 325 MG tablet Take 2 tablets (650 mg total) by mouth every 6 (six) hours as needed for mild pain (or Fever >/= 101). 06/26/14  Yes Patrick Lis, MD  amLODipine (NORVASC) 5 MG tablet TAKE 1 TABLET BY MOUTH ONCE DAILY Patient taking differently: TAKE 5 MG BY MOUTH ONCE DAILY 08/17/14  Yes Patrick Sprang, MD  atorvastatin (LIPITOR) 10 MG tablet TAKE 1 TABLET (10 MG TOTAL) BY MOUTH DAILY. 08/11/15  Yes Patrick Records, MD  carvedilol (COREG) 6.25 MG tablet Take 1 tablet (6.25 mg total) by mouth 2 (two) times daily. Please call and schedule a one year follow up appointment 07/22/15  Yes Patrick Sprang, MD  gabapentin (NEURONTIN) 300 MG capsule Take 300 mg by mouth 2 (two) times daily.   Yes Historical Provider, MD  amiodarone (PACERONE) 200 MG tablet Take 2 tablets (400 mg total) by mouth 2 (two) times daily. For 14 days 08/03/15   Patrick Records, MD  amiodarone (PACERONE) 200 MG tablet Take 1 tablet (200 mg total) by mouth daily. 08/03/15   Patrick Records, MD  docusate sodium (COLACE) 100 MG capsule Take 1 capsule (100 mg total) by mouth 2 (two) times daily. Patient not taking: Reported on 08/12/2015 07/12/14   Patrick Bring, MD  tamsulosin (FLOMAX) 0.4 MG CAPS capsule Take 0.4 mg by mouth daily. 06/28/15   Historical Provider, MD    Family History  Family History  Problem Relation Age of Onset  . Hypertension Mother   . Cancer Mother   . Congestive Heart Failure Father   . Heart disease Father   . Heart attack Father   . Colon cancer Maternal Grandfather   . Hypertension Brother   . Stomach cancer Neg Hx   . Esophageal cancer Neg Hx     Social History  Social History   Social History  . Marital status: Married    Spouse name: N/A  . Number of children: 3  . Years of education: N/A   Occupational History  . Not on file.   Social History Main Topics  . Smoking status: Never Smoker  . Smokeless tobacco: Never Used  . Alcohol use No  . Drug use: No  . Sexual activity: Not on file   Other Topics Concern  . Not on file   Social History Narrative   Married, 3 daughters. Lives in [place], owns his own [employer]. Drinks 3 caffeinated beverages/day.      Review of Systems General:  No chills, fever, night sweats or weight changes.  Cardiovascular:  +chest pain, +dyspnea on  exertion, no edema, orthopnea,+ palpitations, no paroxysmal nocturnal dyspnea. Dermatological: No rash, lesions/masses Respiratory: No cough, dyspnea Urologic: No hematuria, dysuria Abdominal:   No nausea, vomiting, diarrhea, bright red blood per rectum, melena, or hematemesis Neurologic:  No visual changes, wkns, changes in mental status. All other systems reviewed and are otherwise negative except as noted above.  Physical Exam  Blood pressure 150/79, pulse (!) 52, temperature 97.7 F (36.5 C), temperature source Oral, resp. rate 13, SpO2 100 %.  General: Pleasant, NAD Psych: Normal affect. Neuro: Alert and oriented X 3. Moves all extremities spontaneously. HEENT: Normal  Neck: Supple without bruits or JVD. Lungs:  Resp regular and unlabored, CTA. Heart: irregular rhythm, frequent PVCs Abdomen: Soft, non-tender,  non-distended, BS + x 4.  Extremities: No clubbing, cyanosis or edema. DP/PT/Radials 2+ and equal bilaterally.  Labs  Troponin (Point of Care Test)  Recent Labs  08/12/15 1116  TROPIPOC 0.01   No results for input(s): CKTOTAL, CKMB, TROPONINI in the last 72 hours. Lab Results  Component Value Date   WBC 7.5 08/12/2015   HGB 15.5 08/12/2015   HCT 46.0 08/12/2015   MCV 90.9 08/12/2015   PLT 149 (L) 08/12/2015     Recent Labs Lab 08/12/15 1053  NA 140  K 3.9  CL 105  CO2 24  BUN 12  CREATININE 1.17  CALCIUM 9.8  GLUCOSE 97   Lab Results  Component Value Date   CHOL 115 (L) 05/06/2015   HDL 26 (L) 05/06/2015   LDLCALC 59 05/06/2015   TRIG 151 (H) 05/06/2015   No results found for: DDIMER   Radiology/Studies  Ct Angio Chest Aorta W/cm &/or Wo/cm  Result Date: 07/28/2015 CLINICAL DATA:  Follow-up thoracic aortic aneurysm EXAM: CT ANGIOGRAPHY CHEST WITH CONTRAST TECHNIQUE: Multidetector CT imaging of the chest was performed using the standard protocol during bolus administration of intravenous contrast. Multiplanar CT image reconstructions and MIPs  were obtained to evaluate the vascular anatomy. CONTRAST:  100 cc Isovue 370 IV COMPARISON:  03/02/2009 FINDINGS: Cardiovascular: 4.2 cm dilatation of the ascending thoracic aorta compared to 4.5 cm previously. Pacer wires are noted in the right atrium and right ventricle. Heart is mildly enlarged. No evidence of aortic dissection. Mediastinum/Nodes: No mediastinal, hilar, or axillary adenopathy. Lungs/Pleura: Linear atelectasis or scarring in the lung bases. Lungs otherwise clear. No pleural effusions. Upper Abdomen: Imaging into the upper abdomen shows no acute findings. Musculoskeletal: Chest wall soft tissues are unremarkable. No acute bony abnormality or focal bone lesion. Review of the MIP images confirms the above findings. IMPRESSION: Maximum aortic diameter 4.2 cm in the ascending thoracic aorta compared to 4.5 cm previously. Mild cardiomegaly. Electronically Signed   By: Rolm Baptise M.D.   On: 07/28/2015 09:59   ECG  Sinus tachycardia with 1st degree A-V block with frequent Premature ventricular complexes in a pattern of bigeminy  Echocardiogram 07-22-15  Study Conclusions  - Left ventricle: The cavity size was mildly dilated. Wall   thickness was normal. Systolic function was severely reduced. The   estimated ejection fraction was in the range of 25% to 30%.   Severe diffuse hypokinesis with distinct regional wall motion   abnormalities. Disproportionately severe hypokinesis of the   inferolateral and inferior myocardium. Abnormal relaxation with   normal filling pressures. - Aortic valve: There was mild to moderate regurgitation directed   centrally in the LVOT. - Left atrium: The atrium was severely dilated. - Right ventricle: The cavity size was mildly dilated. Wall   thickness was normal. - Right atrium: The atrium was mildly dilated. - Pulmonary arteries: Systolic pressure was mildly increased. PA   peak pressure: 36 mm Hg (S).  Impressions:  - Compared to August 2015  report, there is substantial reduction in   LV systolic function.    ASSESSMENT AND PLAN  Active Problems:   Hyperlipidemia   Aortic valve disorder   PVC's (premature ventricular contractions)   Aneurysm of thoracic aorta (HCC)   Sleep apnea   PACEMAKER-[employer]tr[employer]   Essential hypertension   Left ventricular systolic dysfunction   1. Symptomatic PVCs: recent device interrogation revealed frequent PVCs. Flecainide stopped 07/28/15 for severe LV dysfunction. Patient was ordered to start PO Amiodarone but has not yet  started. Telemetry and EKG shows frequent PCVs in a pattern of bigeminy. He is symptomatic with exertional dyspnea, CP, palpitations, dizziness and near syncope. BP is stable. K is WNL. Recent TSH 08/05/15 was normal. We will plan to admit and load with IV amiodarone and will monitor closely on telemetry. We will ask EP to see.   2. Cardiomyopathy/ LV Systolic Dysfunction: recent echo 07/22/15 showed reduced LVEF down to 25-30%, previously 50-55%. This is suspected to be tachy mediated in the setting of high PVC burden. He had a LHC 4 years ago, 08/2011, which showed normal LM, mild luminal irregularities in the LAD, small but normal diagonals, normal LCx/ OM1, OM2, mild luminal irregularities in the RCA with only 25% stenosis after the AM.  PDA moderate sized and normal. Continue Coreg. We will increase this to 12.5 mg BID. Will try to add and ACE/ARB if BP allows.  3. PPM: implanted for symptomatic bradycardia. Followed by Dr. C[place].   4. Aortic Insufficiency: recent echo 07/2015 showed mild to moderate regurgitation directed centrally in the LVOT.  5. Ascending Aortic Aneurysm: followed by CT. Recent study 07/22/15 showed stable dimensions measuring at 42 mm (previous study in 2014 was measured at 45 mm).   6. OSA: on CPAP. Followed by Dr. Radford [place].   7. HTN: BP is moderately elevated. Will add ACE/ARB given new LV systolic dysfunction.   8. HLD: continue home statin, Lipitor 10 mg.    Signed, Lyda Jester, PA-C 08/12/2015, 2:09 PM  The patient was seen, examined and discussed with Brittainy M. Rosita Fire, PA-C and I agree with the above.   This is a very pleasant 70 year old gentleman with prior medical history of symptomatic bradycardia for which he had a pacemaker placed, who has been followed by Dr. Harrington Weaver is been complaining for lack of energy, worsening shortness of breath. Recent interrogation of his pacemaker revealed that he has heavy burden of PVCs. The patient was scheduled to undergo pulmonary function tests today and he was supposed to start taking by mouth amiodarone afterwards. However he became profoundly short of breath PFTs and was brought to the ER. Telemetry in the ER shows ventricular bigeminy when every single sinus beat is followed by a PVC, patient states he feels nauseous, short of breath with no energy. He was also recently diagnosed with new LV dysfunction with LVEF of 25-36% previously being 55-60% and this is believed to be tachycardia or PVC mediated.  We will start amiodarone drip as well as increase his carvedilol from 6.25 twice a day to 12.5 by mouth twice a day. We will admit to telemetry and follow closely. His potassium is 3.9, we'll check his magnesium, BNP is elevated at 360 and troponin is negative. On physical exam he doesn't have any signs of volume overload.  Ena Dawley 08/12/2015

## 2015-08-12 NOTE — ED Provider Notes (Signed)
[facility] DEPT Provider Note   CSN: [id number] Arrival date & time: 08/12/15  1041  First Provider Contact:  First MD Initiated Contact with Patient 08/12/15 1059        History   Chief Complaint Chief Complaint  Patient presents with  . Shortness of Breath    HPI [place] is a 70 y.o. male.   Shortness of Breath  This is a recurrent problem. Duration: several. The problem occurs frequently.Episode onset: this episode started while walking from parking deck to hospital for scheduled PFTs. The problem has been gradually improving. Associated symptoms include chest pain and leg swelling. Pertinent negatives include no fever, no headaches, no rhinorrhea, no ear pain, no neck pain, no cough, no sputum production and no orthopnea. Precipitated by: walking. Risk factors: h/o CHF with EF 20-25% in July.  Chest Pain   This is a recurrent problem. The pain is associated with exertion. The pain is present in the substernal region. The quality of the pain is described as heavy and pressure-like. The pain radiates to the left arm. Associated symptoms include shortness of breath. Pertinent negatives include no cough, no fever, no headaches, no orthopnea and no sputum production.   Was going to get PFTs in order start amio for frequent PVCs with decreased EF recently. Recently stopped flecanide.  Pt with pacemaker for bradycardia.  Past Medical History:  Diagnosis Date  . Bladder stone   . BPH (benign prostatic hyperplasia)   . Bradycardia    treated with pacemaker placement- Dr. Virl Axe MD  . Dyslipidemia    failed niaspan  . HTN (hypertension)   . Pacemaker reprogramming/check   . PACEMAKER-[employer]tr[employer] 08/31/2008   Qualifier: Diagnosis of  By: Caryl Comes, MD, Remus Blake   . Palpitations   . PVCs (premature ventricular contractions)   . Sleep apnea    USES C-PAP    Patient Active Problem List   Diagnosis Date Noted  . Left ventricular systolic dysfunction  [id number]  . DOE (dyspnea on exertion) 08/12/2015  . Symptomatic PVCs 08/12/2015  . Benign prostatic hyperplasia (BPH) with straining on urination 07/12/2014  . Complicated UTI (urinary tract infection) 06/25/2014  . Testicular pain 06/25/2014  . Essential hypertension 06/25/2014  . Leukocytosis   . Urinary tract infection 06/24/2014  . PRECORDIAL PAIN 08/31/2008  . PACEMAKER-[employer]tr[employer] 08/31/2008  . Aneurysm of thoracic aorta ([facility]) 07/06/2008  . Hyperlipidemia 04/22/2008  . Aortic valve disorder 03/24/2008  . PVC's (premature ventricular contractions) 03/24/2008  . 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 PULMONARY NODULE 03/25/2007  . Sleep apnea 03/24/2007    Past Surgical History:  Procedure Laterality Date  . CYSTOSCOPY WITH LITHOLAPAXY N/A 07/12/2014   Procedure: CYSTOSCOPY WITH LITHOLAPAXY;  Surgeon: Raynelle Bring, MD;  Location: [facility];  Service: Urology;  Laterality: N/A;  . EP IMPLANTABLE DEVICE  2009  . TONSILLECTOMY    . TRANSURETHRAL RESECTION OF PROSTATE N/A 07/12/2014   Procedure: TRANSURETHRAL RESECTION OF THE PROSTATE (TURP);  Surgeon: Raynelle Bring, MD;  Location: [facility];  Service: Urology;  Laterality: N/A;       Home Medications    Prior to Admission medications   Medication Sig Start Date End Date Taking? Authorizing Provider  acetaminophen (TYLENOL) 325 MG tablet Take 2 tablets (650 mg total) by mouth every 6 (six) hours as needed for mild pain (or Fever >/= 101). 06/26/14  Yes Robbie Lis, MD  amLODipine (NORVASC) 5 MG tablet TAKE 1 TABLET BY MOUTH ONCE  DAILY Patient taking differently: TAKE 5 MG BY MOUTH ONCE DAILY 08/17/14  Yes Deboraha Sprang, MD  atorvastatin (LIPITOR) 10 MG tablet TAKE 1 TABLET (10 MG TOTAL) BY MOUTH DAILY. 08/11/15  Yes Fay Records, MD  carvedilol (COREG) 6.25 MG tablet Take 1 tablet (6.25 mg total) by mouth 2 (two) times daily. Please call and schedule a one year follow up appointment 07/22/15  Yes  Deboraha Sprang, MD  gabapentin (NEURONTIN) 300 MG capsule Take 300 mg by mouth 2 (two) times daily.   Yes Historical Provider, MD  tamsulosin (FLOMAX) 0.4 MG CAPS capsule Take 0.4 mg by mouth daily. 06/28/15  Yes Historical Provider, MD  amiodarone (PACERONE) 200 MG tablet Take 2 tablets (400 mg total) by mouth 2 (two) times daily. For 14 days 08/03/15   Fay Records, MD  amiodarone (PACERONE) 200 MG tablet Take 1 tablet (200 mg total) by mouth daily. 08/03/15   Fay Records, MD  docusate sodium (COLACE) 100 MG capsule Take 1 capsule (100 mg total) by mouth 2 (two) times daily. Patient not taking: Reported on 08/12/2015 07/12/14   Raynelle Bring, MD    Family History Family History  Problem Relation Age of Onset  . Hypertension Mother   . Cancer Mother   . Congestive Heart Failure Father   . Heart disease Father   . Heart attack Father   . Colon cancer Maternal Grandfather   . Hypertension Brother   . Stomach cancer Neg Hx   . Esophageal cancer Neg Hx     Social History Social History  Substance Use Topics  . Smoking status: Never Smoker  . Smokeless tobacco: Never Used  . Alcohol use No     Allergies   Codeine   Review of Systems Review of Systems  Constitutional: Negative for fever.  HENT: Negative for ear pain and rhinorrhea.   Respiratory: Positive for shortness of breath. Negative for cough and sputum production.   Cardiovascular: Positive for chest pain and leg swelling. Negative for orthopnea.  Musculoskeletal: Negative for neck pain.  Neurological: Negative for headaches.  All other systems reviewed and are negative.    Physical Exam Updated Vital Signs BP 120/65 (BP Location: Left Arm)   Pulse (!) 58   Temp 97.7 F (36.5 C) (Oral)   Resp 22   SpO2 100%   Physical Exam  Constitutional: He is oriented to person, place, and time. He appears well-nourished. No distress.  HENT:  Head: Normocephalic and atraumatic.  Right Ear: External ear normal.  Left Ear:  External ear normal.  Eyes: Pupils are equal, round, and reactive to light. Right eye exhibits no discharge. Left eye exhibits no discharge. No scleral icterus.  Neck: Normal range of motion. Neck supple.  Cardiovascular: Normal rate.  Exam reveals no gallop and no friction rub.   No murmur heard. Pulmonary/Chest: Effort normal and breath sounds normal. No stridor. No respiratory distress. He has no wheezes. He has no rales. He exhibits no tenderness.  Abdominal: Soft. He exhibits no distension and no mass. There is no tenderness. There is no rebound and no guarding.  Musculoskeletal: He exhibits no edema or tenderness.  Neurological: He is alert and oriented to person, place, and time.  Skin: Skin is warm and dry. No rash noted. He is not diaphoretic. No erythema.     ED Treatments / Results  Labs (all labs ordered are listed, but only abnormal results are displayed) Labs Reviewed  CBC - Abnormal; Notable  for the following:       Result Value   Platelets 149 (*)    All other components within normal limits  BRAIN NATRIURETIC PEPTIDE - Abnormal; Notable for the following:    B Natriuretic Peptide 358.0 (*)    All other components within normal limits  BASIC METABOLIC PANEL  I-STAT TROPOININ, ED    EKG  EKG Interpretation  Date/Time:  Friday August 12 2015 10:47:22 EDT Ventricular Rate:  104 PR Interval:  236 QRS Duration: 118 QT Interval:  368 QTC Calculation: 483 R Axis:   -32 Text Interpretation:  Sinus tachycardia with 1st degree A-V block with frequent Premature ventricular complexes in a pattern of bigeminy Left axis deviation Non-specific intra-ventricular conduction delay Nonspecific T wave abnormality Abnormal ECG Confirmed by [employer] MD, Michele Kerlin ([id number]) on 08/12/2015 1:33:57 PM       Radiology Dg Chest 2 View  Result Date: 08/12/2015 CLINICAL DATA:  Shortness of Breath EXAM: CHEST  2 VIEW COMPARISON:  07/28/2015 FINDINGS: Cardiac shadow is mildly enlarged. Pacing  device is noted. Lungs are clear bilaterally. No bony abnormality is seen. IMPRESSION: No active cardiopulmonary disease. Electronically Signed   By: Inez Catalina M.D.   On: 08/12/2015 14:43    Procedures Procedures (including critical care time)  Medications Ordered in ED Medications  albuterol (PROVENTIL) (2.5 MG/3ML) 0.083% nebulizer solution 5 mg (0 mg Nebulization Hold 08/12/15 1126)  carvedilol (COREG) tablet 12.5 mg (not administered)  amiodarone (NEXTERONE) 1.8 mg/mL load via infusion 150 mg (150 mg Intravenous Bolus from Bag 08/12/15 1638)    Followed by  amiodarone (NEXTERONE PREMIX) 360-4.14 MG/200ML-% (1.8 mg/mL) IV infusion (not administered)    Followed by  amiodarone (NEXTERONE PREMIX) 360-4.14 MG/200ML-% (1.8 mg/mL) IV infusion (not administered)     Initial Impression / Assessment and Plan / ED Course  I have reviewed the triage vital signs and the nursing notes.  Pertinent labs & imaging results that were available during my care of the patient were reviewed by me and considered in my medical decision making (see chart for details).  Clinical Course    Worsening dyspnea on exertion with recent echo revealing worsening ejection fraction. Patient also with aortic root dilatation resulting in aortic regurgitation. Scheduled to be started on amiodarone due to frequent PVCs. No infectious symptoms.  Patient's afebrile with stable vital signs satting well on room air.  EKG as above with frequent PVCs no evidence of acute ischemia. Initial troponin negative. BNP mildly elevated. Chest x-ray without evidence of pulmonary edema.  Cardiology consultation as he is followed by [facility] cardiology. They evaluated the patient in the emergency department and would like to admit him for further management. Next  He remained hemodynamically stable in the ED was transferred to the floor without complication.  Final Clinical Impressions(s) / ED Diagnoses   Final diagnoses:  DOE  (dyspnea on exertion)  Chest pain, unspecified chest pain type  Frequent PVCs    Disposition: Admit  Condition: Stable    Fatima Blank, MD 08/12/15 1711

## 2015-08-12 NOTE — Telephone Encounter (Signed)
New message     Tammie from [facility] respiratory department verbalized that pt has a PFT test ordered by Dr.Ross, that needs to be preformed but pt walked a far distant not using the valet parking and he is exhausted. She need to speak to rn to see if pt test still needs to be done or if pt needs to reschedule and report to the ER.  rn took call

## 2015-08-12 NOTE — ED Notes (Signed)
Pt waiting on Cardiology to speak with EDP. Verbalizes understanding. No needs at this time

## 2015-08-12 NOTE — ED Triage Notes (Signed)
Pt. Stated, I was on the way to have a breathing test and I got so SOB I couldn't make it.

## 2015-08-12 NOTE — Telephone Encounter (Signed)
I called and spoke with the patient's wife to relay Dr. [name] recommendations to her regarding the follow up plan for him starting amiodarone. Per Mrs. Courteau, the patient went for his PFT's today and they did not even do them as he was having lots of ectopy.  He was sent to the ER for evaluation and is currently there. Per his wife, they are waiting for a cardiology evaluation. I advised Mrs. Dillion I will notify Dr. Caryl Comes.  MD aware.

## 2015-08-12 NOTE — Telephone Encounter (Signed)
Call transferred into triage.  Spoke with Tammy in the pulmonary lab at [facility].  States patient was unaware of valet parking and parked in the garage and walked from his car to the admitting.  Admitting brought him by wheelchair to the department.  He is extremely SOB and exhausted.    He arrived alone.  She is having him reschedule the PFTs and taking him to the ED to be evaluated and wanted Dr. [name] to be aware.  I advised that Dr. [name] is not available but I will send her the message to review when she returns.

## 2015-08-12 NOTE — ED Notes (Signed)
Ordered Patient meal tray

## 2015-08-13 DIAGNOSIS — I712 Thoracic aortic aneurysm, without rupture: Secondary | ICD-10-CM

## 2015-08-13 DIAGNOSIS — I5022 Chronic systolic (congestive) heart failure: Secondary | ICD-10-CM

## 2015-08-13 DIAGNOSIS — I5021 Acute systolic (congestive) heart failure: Secondary | ICD-10-CM

## 2015-08-13 HISTORY — DX: Chronic systolic (congestive) heart failure: I50.22

## 2015-08-13 LAB — BASIC METABOLIC PANEL
ANION GAP: 9 (ref 5–15)
BUN: 14 mg/dL (ref 6–20)
CHLORIDE: 105 mmol/L (ref 101–111)
CO2: 25 mmol/L (ref 22–32)
Calcium: 8.9 mg/dL (ref 8.9–10.3)
Creatinine, Ser: 1.24 mg/dL (ref 0.61–1.24)
GFR, EST NON AFRICAN AMERICAN: 58 mL/min — AB (ref >60)
Glucose, Bld: 108 mg/dL — ABNORMAL HIGH (ref 65–99)
POTASSIUM: 3.6 mmol/L (ref 3.5–5.1)
SODIUM: 139 mmol/L (ref 135–145)

## 2015-08-13 LAB — CBC
HCT: 42.5 % (ref 39.0–52.0)
HEMOGLOBIN: 14.4 g/dL (ref 13.0–17.0)
MCH: 30.5 pg (ref 26.0–34.0)
MCHC: 33.9 g/dL (ref 30.0–36.0)
MCV: 90 fL (ref 78.0–100.0)
PLATELETS: 151 10*3/uL (ref 150–400)
RBC: 4.72 MIL/uL (ref 4.22–5.81)
RDW: 12.9 % (ref 11.5–15.5)
WBC: 7 10*3/uL (ref 4.0–10.5)

## 2015-08-13 LAB — MAGNESIUM: MAGNESIUM: 2.2 mg/dL (ref 1.7–2.4)

## 2015-08-13 MED ORDER — TAMSULOSIN HCL 0.4 MG PO CAPS
0.4000 mg | ORAL_CAPSULE | Freq: Every day | ORAL | Status: DC
  Administered 2015-08-13 – 2015-08-14 (×2): 0.4 mg via ORAL
  Filled 2015-08-13 (×2): qty 1

## 2015-08-13 MED ORDER — IRBESARTAN 150 MG PO TABS
150.0000 mg | ORAL_TABLET | Freq: Every day | ORAL | Status: DC
  Administered 2015-08-14 – 2015-08-15 (×2): 150 mg via ORAL
  Filled 2015-08-13 (×2): qty 1

## 2015-08-13 NOTE — Procedures (Signed)
Pt has home cpap with home settings.

## 2015-08-13 NOTE — Progress Notes (Signed)
       Patient Name: Patrick Weaver Date of Encounter: 08/13/2015    SUBJECTIVE: He feels well today. Chest tightness and dyspnea that occurred yesterday has resolved.  TELEMETRY:  Paced rhythm with occasional PVCs. Bigeminy is no longer continuous since starting IV amiodarone. Vitals:   08/12/15 2200 08/12/15 2252 08/12/15 2300 08/13/15 0506  BP: 128/70 123/80 123/80 124/78  Pulse: (!) 42 93 (!) 48 70  Resp: 14 18 18 16   Temp:  97.8 F (36.6 C)  98.1 F (36.7 C)  TempSrc:  Oral  Oral  SpO2: 97% 100% 99% 100%  Weight:  212 lb 14.4 oz (96.6 kg)  212 lb (96.2 kg)  Height:  6' (1.829 m)      Intake/Output Summary (Last 24 hours) at 08/13/15 1346 Last data filed at 08/13/15 0909  Gross per 24 hour  Intake              240 ml  Output                0 ml  Net              240 ml   LABS: Basic Metabolic Panel:  Recent Labs  08/12/15 1053 08/13/15 0006  NA 140 139  K 3.9 3.6  CL 105 105  CO2 24 25  GLUCOSE 97 108*  BUN 12 14  CREATININE 1.17 1.24  CALCIUM 9.8 8.9  MG  --  2.2   CBC:  Recent Labs  08/12/15 1053 08/13/15 0006  WBC 7.5 7.0  HGB 15.5 14.4  HCT 46.0 42.5  MCV 90.9 90.0  PLT 149* 151     Radiology/Studies:  No new data  Physical Exam: Blood pressure 124/78, pulse 70, temperature 98.1 F (36.7 C), temperature source Oral, resp. rate 16, height 6' (1.829 m), weight 212 lb (96.2 kg), SpO2 100 %. Weight change:   Wt Readings from Last 3 Encounters:  08/13/15 212 lb (96.2 kg)  06/16/15 211 lb 6.4 oz (95.9 kg)  05/06/15 209 lb 6.4 oz (95 kg)   2/6 systolic murmur at the right upper sternal border. Irregular rhythm is noted. Lungs are clear. No peripheral edema. No significant JVD.  ASSESSMENT:  1. 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 Presumed on the basis of ventricular ectopy with greater than 25% of cardiac activity related to premature ventricular contractions. 2. IV amiodarone to suppress ventricular ectopy. 3. 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 Obstructive sleep apnea on sleep apnea  Plan:  1. Plan to continue IV amiodarone. 2. Modify current medical therapy to optimize recovery of LV function in the setting of new systolic heart failure. 3. I will discontinue amlodipine and start ARB therapy.  Signed, Belva Crome III 08/13/2015, 1:46 PM

## 2015-08-14 LAB — BASIC METABOLIC PANEL
ANION GAP: 12 (ref 5–15)
BUN: 11 mg/dL (ref 6–20)
CHLORIDE: 103 mmol/L (ref 101–111)
CO2: 23 mmol/L (ref 22–32)
CREATININE: 1.18 mg/dL (ref 0.61–1.24)
Calcium: 9.2 mg/dL (ref 8.9–10.3)
GFR calc non Af Amer: >60 mL/min (ref >60)
Glucose, Bld: 105 mg/dL — ABNORMAL HIGH (ref 65–99)
POTASSIUM: 3.6 mmol/L (ref 3.5–5.1)
Sodium: 138 mmol/L (ref 135–145)

## 2015-08-14 MED ORDER — AMIODARONE HCL 200 MG PO TABS
400.0000 mg | ORAL_TABLET | Freq: Two times a day (BID) | ORAL | Status: DC
  Administered 2015-08-14 – 2015-08-15 (×3): 400 mg via ORAL
  Filled 2015-08-14 (×3): qty 2

## 2015-08-14 NOTE — Progress Notes (Signed)
Pt is on home unit CPAP on arrival

## 2015-08-14 NOTE — Progress Notes (Signed)
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 [name]  70 y/o male w/ PMH of PPM for bradycardia and h/o high PVC burden, newly diagnosed reduced LV systolic function (EF 123XX123) (felt to be tachy mediated), as well as h/o AI and ascending aortic aneurysm, who presented to [facility] ED on 8/11 with symptomatic PVCs.   Subjective   Significant improvement in his breathing and palpitations.   Inpatient Medications    . albuterol  5 mg Nebulization Once  . aspirin EC  81 mg Oral Daily  . atorvastatin  10 mg Oral q1800  . carvedilol  12.5 mg Oral BID WC  . gabapentin  300 mg Oral BID  . irbesartan  150 mg Oral Daily  . sodium chloride flush  3 mL Intravenous Q12H  . tamsulosin  0.4 mg Oral QHS    Vital Signs    Vitals:   08/13/15 0506 08/13/15 1546 08/13/15 2007 08/14/15 0413  BP: 124/78  117/70 95/68  Pulse: 70  (!) 53 75  Resp: 16 16 18  (!) 23  Temp: 98.1 F (36.7 C) 97.6 F (36.4 C) 98 F (36.7 C) 97.6 F (36.4 C)  TempSrc: Oral  Oral Oral  SpO2: 100% 97% 98% 98%  Weight: 212 lb (96.2 kg)   203 lb 14.4 oz (92.5 kg)  Height:        Intake/Output Summary (Last 24 hours) at 08/14/15 0854 Last data filed at 08/14/15 0700  Gross per 24 hour  Intake           1240.8 ml  Output              450 ml  Net            790.8 ml   Filed Weights   08/12/15 2252 08/13/15 0506 08/14/15 0413  Weight: 212 lb 14.4 oz (96.6 kg) 212 lb (96.2 kg) 203 lb 14.4 oz (92.5 kg)    Physical Exam    General: Well developed, well nourished, male appearing in no acute distress. Head: Normocephalic, atraumatic.  Neck: Supple without  bruits, JVD not elevated. Lungs:  Resp regular and unlabored, CTA without wheezing or rales. Heart: RRR, S1, S2, no S3, S4, 2/6 SEM at RUSB; no rub. Abdomen: Soft, non-tender, non-distended with normoactive bowel sounds. No hepatomegaly. No rebound/guarding. No obvious abdominal masses. Extremities: No clubbing, cyanosis, or edema. Distal pedal pulses are 2+ bilaterally. Neuro: Alert and oriented X 3. Moves all extremities spontaneously. Psych: Normal affect.  Labs    CBC  Recent Labs  08/12/15 1053 08/13/15 0006  WBC 7.5 7.0  HGB 15.5 14.4  HCT 46.0 42.5  MCV 90.9 90.0  PLT 149* 123XX123   Basic Metabolic Panel  Recent Labs  08/13/15 0006 08/14/15 0354  NA 139 138  K 3.6 3.6  CL 105 103  CO2 25 23  GLUCOSE 108* 105*  BUN 14 11  CREATININE 1.24 1.18  CALCIUM 8.9 9.2  MG 2.2  --     Telemetry    Paced, HR in 70's. Frequent PVC's. Episodes of Bigeminy greatly reduced.   ECG  No new tracings.    Cardiac Studies and Radiology    Dg Chest 2 View  Result Date: 08/12/2015 CLINICAL DATA:  Shortness of Breath EXAM: CHEST  2 VIEW COMPARISON:  07/28/2015 FINDINGS: Cardiac shadow is mildly enlarged. Pacing device is noted. Lungs are clear bilaterally. No bony abnormality is seen. IMPRESSION: No active cardiopulmonary disease. Electronically Signed   By: Inez Catalina M.D.   On: 08/12/2015 14:43   Ct Angio Chest Aorta W/cm &/or Wo/cm  Result Date: 07/28/2015 CLINICAL DATA:  Follow-up thoracic aortic aneurysm EXAM: CT ANGIOGRAPHY CHEST WITH CONTRAST TECHNIQUE: Multidetector CT imaging of the chest was performed using the standard protocol during bolus administration of intravenous contrast. Multiplanar CT image reconstructions and MIPs were obtained to evaluate the vascular anatomy. CONTRAST:  100 cc Isovue 370 IV COMPARISON:  03/02/2009 FINDINGS: Cardiovascular: 4.2 cm dilatation of the ascending thoracic aorta compared to 4.5 cm previously. Pacer wires are noted in the right  atrium and right ventricle. Heart is mildly enlarged. No evidence of aortic dissection. Mediastinum/Nodes: No mediastinal, hilar, or axillary adenopathy. Lungs/Pleura: Linear atelectasis or scarring in the lung bases. Lungs otherwise clear. No pleural effusions. Upper Abdomen: Imaging into the upper abdomen shows no acute findings. Musculoskeletal: Chest wall soft tissues are unremarkable. No acute bony abnormality or focal bone lesion. Review of the MIP images confirms the above findings. IMPRESSION: Maximum aortic diameter 4.2 cm in the ascending thoracic aorta compared to 4.5 cm previously. Mild cardiomegaly. Electronically Signed   By: Rolm Baptise M.D.   On: 07/28/2015 09:59   Echocardiogram: 07/22/2015 Study Conclusions  - Left ventricle: The cavity size was mildly dilated. Wall   thickness was normal. Systolic function was severely reduced. The   estimated ejection fraction was in the range of 25% to 30%.   Severe diffuse hypokinesis with distinct regional wall motion   abnormalities. Disproportionately severe hypokinesis of the   inferolateral and inferior myocardium. Abnormal relaxation with   normal filling pressures. - Aortic valve: There was mild to moderate regurgitation directed   centrally in the LVOT. - Left atrium: The atrium was severely dilated. - Right ventricle: The cavity size was mildly dilated. Wall   thickness was normal. - Right atrium: The atrium was mildly dilated. - Pulmonary arteries: Systolic pressure was mildly increased. PA   peak pressure: 36 mm Hg (S).  Impressions:  - Compared to August 2015 report, there is substantial reduction in   LV systolic function.  Assessment & Plan    1. Symptomatic PVCs - recent device interrogation revealed frequent PVCs. Flecainide stopped 07/28/15 for severe LV dysfunction. Patient was ordered to start PO Amiodarone but has not yet started. Telemetry and EKG shows frequent PCVs in a pattern of bigeminy. He is  symptomatic with exertional dyspnea, CP, palpitations, dizziness and near syncope.  - BP is stable. K is WNL. Recent TSH 08/05/15 was normal.  - started on IV Amiodarone at time of admission with significant improvement in his symptoms. Discussed with Dr. Tamala Julian, will switch from IV Amiodarone to PO Amiodarone 400mg  BID today. If symptoms remain controlled, can likely be discharged tomorrow. (Has PO Amiodarone at home, was prescribed regimen of 400mg  BID for 2 weeks, 200mg  BID for 2 weeks, then 200mg  daily).  2. Cardiomyopathy/ LV Systolic Dysfunction - recent echo 07/22/15 showed reduced LVEF down to 25-30%, previously 50-55%. This is suspected to be tachy mediated in the setting of high PVC burden. He had a LHC 4 years ago, 08/2011, which showed normal  LM, mild luminal irregularities in the LAD, small but normal diagonals, normal LCx/ OM1, OM2, mild luminal irregularities in the RCA with only 25% stenosis after the AM.  PDA moderate sized and normal.  - Coreg increased to 12.5 mg BID. Started on Avapro 150mg  daily. Dose may need to be decreased secondary to hypotension. BP does not allow for addition of Spironolactone at this time.  3. PPM - implanted for symptomatic bradycardia. Followed by Dr. Caryl Comes.   4. Aortic Insufficiency - recent echo 07/2015 showed mild to moderate regurgitation directed centrally in the LVOT.  5. Ascending Aortic Aneurysm  - followed by CT. Recent study 07/22/15 showed stable dimensions measuring at 42 mm (previous study in 2014 was measured at 45 mm).   6. OSA - on CPAP. Followed by Dr. Radford Pax.   7. HTN - BP has been 95/68 - 117/70 in the past 24 hours. - medication adjustments as above.    8. HLD - continue statin therapy.   Arna Medici , PA-C 8:54 AM 08/14/2015 Pager: [phone] The patient has been seen in conjunction with Bernerd Pho, PA-C. All aspects of care have been considered and discussed. The patient has been personally  interviewed, examined, and all clinical data has been reviewed.   Agree with the above note. Blood pressure needs to be followed closely. May need to decrease the dose of beta blocker or ARB. He is asymptomatic currently with present hemodynamics. We'll make no change of this time.  Switch amiodarone to 400 mg by mouth twice a day  Probable discharge in a.m.

## 2015-08-14 NOTE — Progress Notes (Signed)
Late entry: Pt had IV site to left anterior forearm during shift report with amiodarone infusing.  Pt reported pain at site.  Upon assessment during report, site red as well.  IV removed by prior shift RN.  Ice compress applied and extremity elevated on pillow. Site also firm.  IV team and pharmacy consulted regarding further care. No further interventions at present, site marked.  Will continue to monitor closely.

## 2015-08-15 ENCOUNTER — Encounter (HOSPITAL_COMMUNITY): Payer: 59

## 2015-08-15 ENCOUNTER — Encounter (HOSPITAL_COMMUNITY): Payer: Self-pay

## 2015-08-15 LAB — BASIC METABOLIC PANEL
ANION GAP: 7 (ref 5–15)
BUN: 13 mg/dL (ref 6–20)
CHLORIDE: 104 mmol/L (ref 101–111)
CO2: 27 mmol/L (ref 22–32)
Calcium: 9.1 mg/dL (ref 8.9–10.3)
Creatinine, Ser: 1.35 mg/dL — ABNORMAL HIGH (ref 0.61–1.24)
GFR calc Af Amer: >60 mL/min (ref >60)
GFR, EST NON AFRICAN AMERICAN: 53 mL/min — AB (ref >60)
GLUCOSE: 105 mg/dL — AB (ref 65–99)
POTASSIUM: 3.7 mmol/L (ref 3.5–5.1)
Sodium: 138 mmol/L (ref 135–145)

## 2015-08-15 MED ORDER — IRBESARTAN 150 MG PO TABS: 150.0000 mg | ORAL_TABLET | Freq: Every day | ORAL | 11 refills | Status: DC

## 2015-08-15 MED ORDER — CARVEDILOL 12.5 MG PO TABS: 12.5000 mg | ORAL_TABLET | Freq: Two times a day (BID) | ORAL | 11 refills | Status: DC

## 2015-08-15 MED ORDER — ASPIRIN 81 MG PO TBEC: 81.0000 mg | DELAYED_RELEASE_TABLET | Freq: Every day | ORAL | Status: DC

## 2015-08-15 NOTE — Progress Notes (Signed)
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 States he ambulated around the unit yesterday afternoon without complaints.   Inpatient Medications    . albuterol  5 mg Nebulization Once  . amiodarone  400 mg Oral BID  . aspirin EC  81 mg Oral Daily  . atorvastatin  10 mg Oral q1800  . carvedilol  12.5 mg Oral BID WC  . gabapentin  300 mg Oral BID  . irbesartan  150 mg Oral Daily  . sodium chloride flush  3 mL Intravenous Q12H  . tamsulosin  0.4 mg Oral QHS    Vital Signs    Vitals:   08/14/15 0413 08/14/15 1411 08/14/15 1937 08/15/15 0416  BP: 95/68  115/60 (!) 107/56  Pulse: 75  70 (!) 48  Resp: (!) 23 16 18 18   Temp: 97.6 F (36.4 C) 97.6 F (36.4 C) 97.6 F (36.4 C) 97.8 F (36.6 C)  TempSrc: Oral Oral Oral Oral  SpO2: 98% 98% 100% 96%  Weight: 203 lb 14.4 oz (92.5 kg)   203 lb 9.6 oz (92.4 kg)  Height:        Intake/Output Summary (Last 24 hours) at 08/15/15 0832 Last data filed at 08/14/15 1834  Gross per 24 hour  Intake           768.71 ml  Output                0 ml  Net           768.71 ml   Filed Weights   08/13/15 0506 08/14/15 0413 08/15/15 0416  Weight: 212 lb (96.2 kg) 203 lb 14.4 oz (92.5 kg) 203 lb 9.6 oz (92.4 kg)    Physical Exam    General: Pleasant, NAD. Neuro: Alert and oriented X 3. Moves all extremities spontaneously. Psych: Normal affect. HEENT:  Normal  Neck: Supple without bruits or JVD. Lungs:  Resp regular and unlabored, CTA. Heart: RRR no s3, s4, or murmurs. Abdomen: Soft, non-tender, non-distended, BS + x 4.    Extremities: No clubbing, cyanosis or edema. DP/PT/Radials 2+ and equal bilaterally.  Labs    CBC  Recent Labs  08/12/15 1053 08/13/15 0006  WBC 7.5 7.0  HGB 15.5 14.4  HCT 46.0 42.5  MCV 90.9 90.0  PLT 149* 123XX123   Basic Metabolic Panel  Recent Labs  08/13/15 0006 08/14/15 0354 08/15/15 0549  NA 139 138 138  K 3.6 3.6 3.7  CL 105 103 104  CO2 25 23 27   GLUCOSE 108* 105* 105*  BUN 14 11 13   CREATININE 1.24 1.18 1.35*  CALCIUM 8.9 9.2 9.1  MG 2.2  --   --    Liver Function Tests No results for input(s): AST, ALT, ALKPHOS, BILITOT, PROT, ALBUMIN in the last 72 hours. No results for input(s): LIPASE, AMYLASE in the last 72 hours. Cardiac Enzymes No results for input(s): CKTOTAL, CKMB, CKMBINDEX, TROPONINI in the last 72 hours. BNP Invalid input(s): POCBNP D-Dimer No results for input(s): DDIMER in the last 72 hours. Hemoglobin A1C  No results for input(s): HGBA1C in the last 72 hours. Fasting Lipid Panel No results for input(s): CHOL, HDL, LDLCALC, TRIG, CHOLHDL, LDLDIRECT in the last 72 hours. Thyroid Function Tests No results for input(s): TSH, T4TOTAL, T3FREE, THYROIDAB in the last 72 hours.  Invalid input(s): FREET3  Telemetry    Paced, HR in 70s. Freq PVCs.   ECG    No recent  Radiology    Dg Chest 2 View  Result Date: 08/12/2015 CLINICAL DATA:  Shortness of Breath EXAM: CHEST  2 VIEW COMPARISON:  07/28/2015 FINDINGS: Cardiac shadow is mildly enlarged. Pacing device is noted. Lungs are clear bilaterally. No bony abnormality is seen. IMPRESSION: No active cardiopulmonary disease. Electronically Signed   By: Inez Catalina M.D.   On: 08/12/2015 14:43    Assessment & Plan    70 y/o male w/ PMH of PPM for bradycardia and h/o high PVC burden, newly diagnosed reduced LV systolic function (EF 123XX123) (felt to be tachy mediated), as well as h/o AI and ascending aortic aneurysm, who presented to [facility] ED on 8/11 with symptomatic PVCs.   1. Symptomatic  PVCs - recent device interrogation revealed frequent PVCs. Flecainide stopped 07/28/15 for severe LV dysfunction. Patient was ordered to start PO Amiodarone but has not yet started. Telemetry and EKG shows frequent PCVs in a pattern of bigeminy. - BP is stable. K is WNL. Recent TSH 08/05/15 was normal.  - started on IV Amiodarone at time of admission with significant improvement in his symptoms. Switched from IV Amiodarone to PO Amiodarone 400mg  BID yesterday. Has remained asymptomatic throughout the evening and this morning. (Has PO Amiodarone at home, was prescribed regimen of 400mg  BID for 2 weeks, 200mg  BID for 2 weeks, then 200mg  daily).  2. Cardiomyopathy/ LV Systolic Dysfunction - recent echo 07/22/15 showed reduced LVEF down to 25-30%, previously 50-55%. This is suspected to be tachy mediated in the setting of high PVC burden. He had a LHC 4 years ago, 08/2011, which showed normal LM, mild luminal irregularities in the LAD, small but normal diagonals, normal LCx/ OM1, OM2, mild luminal irregularities in the RCA with only 25% stenosis after the AM. PDA moderate sized and normal.  - Coreg increased to 12.5 mg BID. Started on Avapro 150mg  daily.BP is stable this morning. BP does not allow for addition of Spironolactone at this time.  3. PPM - implanted for symptomatic bradycardia. Followed by Dr. Caryl Comes.   4. Aortic Insufficiency - recent echo 07/2015 showed mild to moderate regurgitation directed centrally in the LVOT.  5. Ascending Aortic Aneurysm - followed by CT. Recent study 07/22/15 showed stable dimensions measuring at 42 mm (previous study in 2014 was measured at 45 mm).    6. OSA - on CPAP. Followed by Dr. Radford Pax.   7. HTN - medication adjustments as above.    8. HLD - continue statin therapy.   Signed, Reino Bellis NP-C Pager [phone]   Pt seen and examined  Agree w findings as noted above by L Roberts   Pt familiar to me from clinic  Symptomatic PVCs with LV  dysfunctoin DOing better now  On po amio bid  WIll continuefor a few wks then decrease  Will need outpt PFTs  I will follow in clinic long term with [name].   OK for d/c   Dorris Carnes

## 2015-08-15 NOTE — Discharge Summary (Signed)
Discharge Summary    Patient ID: Patrick Weaver,  MRN: [id number], DOB/AGE: 1945-04-19 70 y.o.  Admit date: 08/12/2015 Discharge date: 08/15/2015  Primary Care Provider: Anthoney Harada Primary Cardiologist: Dr. [name]  Discharge Diagnoses    Active Problems:   Hyperlipidemia   Aortic valve disorder   PVC's (premature ventricular contractions)   Aneurysm of thoracic aorta (HCC)   Sleep apnea   PACEMAKER-[employer]   Essential hypertension   Left ventricular systolic dysfunction   DOE (dyspnea on exertion)   Frequent PVCs   Acute systolic heart failure (HCC)   Allergies Allergies  Allergen Reactions  . Codeine Nausea Only    Diagnostic Studies/Procedures    None _____________   History of Present Illness     70 y/o male with h/o symptomatic bradycardia w/p PPM. He has also had significant problems with PVCs, and was treated with Flecainide. He had a LHC in Aug 2014 which demonstrated no significant coronary disease. EF was 65%. He also has AI and an ascending aortic aneurysm. Recent chest CT 07/28/15 showed stable aortic diameter of 42 mm. 2D echo also 07/2015 showed severely reduced LVEF of 25-30%. This was significantly reduced compared to prior study in which EF was normal at  50-55%. His AI was noted to be mild to moderate with regurgitation directed centrally in the LVOT. The left atrium was severely dilated. His primary cardiologist is Dr. [name]. Dr. Caryl Comes is his EP MD and Dr. Radford Pax follows him for OSA. He is on CAPA.   He was seen in clinic by Dr. [name] in f/u on 07/28/15. His PPM was interrogated and he was noted to have frequent PVCs. It was felt that his reduced LVEF was likely tachy mediated. As a result, his Flecainide was discontinued and he was started on amiodarone. He was instructed to take 400 mg BID x 2 weeks>>200 mg BID x 2 weeks>>200 mg daily. LFTs and TSH were checked and normal. PFTs were also arranged, scheduled for today, 08/12/15. It should also be  noted that the patient and his wife had concerns regarding potential side effects of amiodarone, thus have not yet starting taking the medication. They wanted to wait until he got his PFT results before starting.   On 08/12/2015 while walking in the the clinic office for his appointment for his PFTs, he developed severe dyspnea and left sided CP with radiation down his left harm. He notes he has been symptomatic with exertion for several days but gradually worsening. Also with dizziness and near syncope. No frank syncope. Also with palpitations. He discontinued Flecainide as directed but has not yet started amiodarone as mentioned above. EKG and telemetry shows frequent PVCs and bigeminy. He denies dyspnea and chest pain at rest.   Hospital Course     Consultants: None   He was admitted to our service and started on an amiodarone drip, along with his coreg increased to 12.5mg  BID. Overnight his chest tightness and dyspnea resolved. His telemetry showed a continued paced rhythm with occasional PVCs, with bigeminy improved since starting the amiodarone drip. His amlodipine was stopped and he was started on avapro 150mg  daily.   On 8/13 he was transitioned over to 400mg  amiodarone BID with plans to continue this for 2 weeks, then 200mg  BID for 2 weeks and then 200mg  daily. His BP was stable with the addition of Avapro 150mg , but no room to add spironolactone. He was observed overnight without any further complaints and telemetry noted a stable HR  and continued PVCs with decreased frequency.   On 8/14 he was seen and examined by Dr. [name] and determined stable for discharge home. I have arranged a 2 week follow up in the office. He reports having filled his amiodarone Rx and has the medication at home. He will need to be rescheduled for his outpatient PFTs. Follow up BMET given he has been started on ARB.  _____________  Discharge Vitals Blood pressure (!) 107/56, pulse (!) 48, temperature 97.8 F (36.6  C), temperature source Oral, resp. rate 18, height 6' (1.829 m), weight 203 lb 9.6 oz (92.4 kg), SpO2 96 %.  Filed Weights   08/13/15 0506 08/14/15 0413 08/15/15 0416  Weight: 212 lb (96.2 kg) 203 lb 14.4 oz (92.5 kg) 203 lb 9.6 oz (92.4 kg)    Labs & Radiologic Studies    CBC  Recent Labs  08/13/15 0006  WBC 7.0  HGB 14.4  HCT 42.5  MCV 90.0  PLT 123XX123   Basic Metabolic Panel  Recent Labs  08/13/15 0006 08/14/15 0354 08/15/15 0549  NA 139 138 138  K 3.6 3.6 3.7  CL 105 103 104  CO2 25 23 27   GLUCOSE 108* 105* 105*  BUN 14 11 13   CREATININE 1.24 1.18 1.35*  CALCIUM 8.9 9.2 9.1  MG 2.2  --   --    Liver Function Tests No results for input(s): AST, ALT, ALKPHOS, BILITOT, PROT, ALBUMIN in the last 72 hours. No results for input(s): LIPASE, AMYLASE in the last 72 hours. Cardiac Enzymes No results for input(s): CKTOTAL, CKMB, CKMBINDEX, TROPONINI in the last 72 hours. BNP Invalid input(s): POCBNP D-Dimer No results for input(s): DDIMER in the last 72 hours. Hemoglobin A1C No results for input(s): HGBA1C in the last 72 hours. Fasting Lipid Panel No results for input(s): CHOL, HDL, LDLCALC, TRIG, CHOLHDL, LDLDIRECT in the last 72 hours. Thyroid Function Tests No results for input(s): TSH, T4TOTAL, T3FREE, THYROIDAB in the last 72 hours.  Invalid input(s): FREET3 _____________  Dg Chest 2 View  Result Date: 08/12/2015 CLINICAL DATA:  Shortness of Breath EXAM: CHEST  2 VIEW COMPARISON:  07/28/2015 FINDINGS: Cardiac shadow is mildly enlarged. Pacing device is noted. Lungs are clear bilaterally. No bony abnormality is seen. IMPRESSION: No active cardiopulmonary disease. Electronically Signed   By: Inez Catalina M.D.   On: 08/12/2015 14:43   Ct Angio Chest Aorta W/cm &/or Wo/cm  Result Date: 07/28/2015 CLINICAL DATA:  Follow-up thoracic aortic aneurysm EXAM: CT ANGIOGRAPHY CHEST WITH CONTRAST TECHNIQUE: Multidetector CT imaging of the chest was performed using the  standard protocol during bolus administration of intravenous contrast. Multiplanar CT image reconstructions and MIPs were obtained to evaluate the vascular anatomy. CONTRAST:  100 cc Isovue 370 IV COMPARISON:  03/02/2009 FINDINGS: Cardiovascular: 4.2 cm dilatation of the ascending thoracic aorta compared to 4.5 cm previously. Pacer wires are noted in the right atrium and right ventricle. Heart is mildly enlarged. No evidence of aortic dissection. Mediastinum/Nodes: No mediastinal, hilar, or axillary adenopathy. Lungs/Pleura: Linear atelectasis or scarring in the lung bases. Lungs otherwise clear. No pleural effusions. Upper Abdomen: Imaging into the upper abdomen shows no acute findings. Musculoskeletal: Chest wall soft tissues are unremarkable. No acute bony abnormality or focal bone lesion. Review of the MIP images confirms the above findings. IMPRESSION: Maximum aortic diameter 4.2 cm in the ascending thoracic aorta compared to 4.5 cm previously. Mild cardiomegaly. Electronically Signed   By: Rolm Baptise M.D.   On: 07/28/2015 09:59  Disposition  Pt is being discharged home today in good condition.  Follow-up Plans & Appointments    Follow-up Information    Patrick Jester, PA-C Follow up on 08/31/2015.   Specialties:  Cardiology, Radiology Why:  10am for you hospital follow up with Dr. Alan Ripper PA Brittainy Contact information: [place] [address] [phone]          Discharge Instructions    ([place]) Call MD:  Anytime you have any of the following symptoms: 1) 3 pound weight gain in 24 hours or 5 pounds in 1 week 2) shortness of breath, with or without a dry hacking cough 3) swelling in the hands, feet or stomach 4) if you have to sleep on extra pillows at night in order to breathe.    Complete by:  As directed   Diet - low sodium heart healthy    Complete by:  As directed   Discharge instructions    Complete by:  As directed   Please take  Amiodarone 400mg  twice a day for 2 weeks, then 200mg  twice a day for 2 weeks and then 200mg  daily.   Increase activity slowly    Complete by:  As directed      Discharge Medications   Current Discharge Medication List    START taking these medications   Details  aspirin EC 81 MG EC tablet Take 1 tablet (81 mg total) by mouth daily.    irbesartan (AVAPRO) 150 MG tablet Take 1 tablet (150 mg total) by mouth daily. Qty: 30 tablet, Refills: 11      CONTINUE these medications which have CHANGED   Details  carvedilol (COREG) 12.5 MG tablet Take 1 tablet (12.5 mg total) by mouth 2 (two) times daily with a meal. Qty: 60 tablet, Refills: 11      CONTINUE these medications which have NOT CHANGED   Details  acetaminophen (TYLENOL) 325 MG tablet Take 2 tablets (650 mg total) by mouth every 6 (six) hours as needed for mild pain (or Fever >/= 101). Qty: 30 tablet, Refills: 0    atorvastatin (LIPITOR) 10 MG tablet TAKE 1 TABLET (10 MG TOTAL) BY MOUTH DAILY. Qty: 90 tablet, Refills: 1    gabapentin (NEURONTIN) 300 MG capsule Take 300 mg by mouth 2 (two) times daily.    tamsulosin (FLOMAX) 0.4 MG CAPS capsule Take 0.4 mg by mouth daily.    !! amiodarone (PACERONE) 200 MG tablet Take 2 tablets (400 mg total) by mouth 2 (two) times daily. For 14 days Qty: 56 tablet, Refills: 0    !! amiodarone (PACERONE) 200 MG tablet Take 1 tablet (200 mg total) by mouth daily. Qty: 90 tablet, Refills: 3    docusate sodium (COLACE) 100 MG capsule Take 1 capsule (100 mg total) by mouth 2 (two) times daily. Qty: 30 capsule, Refills: 0     !! - Potential duplicate medications found. Please discuss with provider.    STOP taking these medications     amLODipine (NORVASC) 5 MG tablet           Outstanding Labs/Studies   Follow up BMET  Duration of Discharge Encounter   Greater than 30 minutes including physician time.  Signed, Reino Bellis NP-C 08/15/2015, 1:32 PM

## 2015-08-22 DIAGNOSIS — I5022 Chronic systolic (congestive) heart failure: Secondary | ICD-10-CM | POA: Diagnosis not present

## 2015-08-22 DIAGNOSIS — I351 Nonrheumatic aortic (valve) insufficiency: Secondary | ICD-10-CM | POA: Diagnosis not present

## 2015-08-22 DIAGNOSIS — N200 Calculus of kidney: Secondary | ICD-10-CM | POA: Diagnosis not present

## 2015-08-22 DIAGNOSIS — I712 Thoracic aortic aneurysm, without rupture: Secondary | ICD-10-CM | POA: Diagnosis not present

## 2015-08-22 DIAGNOSIS — E785 Hyperlipidemia, unspecified: Secondary | ICD-10-CM | POA: Diagnosis not present

## 2015-08-22 DIAGNOSIS — B0229 Other postherpetic nervous system involvement: Secondary | ICD-10-CM | POA: Diagnosis not present

## 2015-08-22 DIAGNOSIS — N400 Enlarged prostate without lower urinary tract symptoms: Secondary | ICD-10-CM | POA: Diagnosis not present

## 2015-08-22 DIAGNOSIS — I10 Essential (primary) hypertension: Secondary | ICD-10-CM | POA: Diagnosis not present

## 2015-08-22 DIAGNOSIS — Z950 Presence of cardiac pacemaker: Secondary | ICD-10-CM | POA: Diagnosis not present

## 2015-08-24 ENCOUNTER — Other Ambulatory Visit: Payer: Self-pay | Admitting: Internal Medicine

## 2015-08-26 ENCOUNTER — Telehealth: Payer: Self-pay | Admitting: Internal Medicine

## 2015-08-26 MED ORDER — AMIODARONE HCL 200 MG PO TABS: ORAL_TABLET | ORAL | 0 refills | Status: DC

## 2015-08-26 NOTE — Telephone Encounter (Signed)
Spoke to patient's wife. Informed that new script was sent for amiodarone--take 200 mg BID x 14 days and then 200 mg daily.  They are aware of appointment this Wed with APP.

## 2015-08-26 NOTE — Telephone Encounter (Signed)
New message        *STAT* If patient is at the pharmacy, call can be transferred to refill team.   1. Which medications need to be refilled? (please list name of each medication and dose if known) amiodarone 200mg  2tab bid 2. Which pharmacy/location (including street and city if local pharmacy) is medication to be sent to?[email or web address] 3. Do they need a 30 day or 90 day supply? 30 This dosage is for 14 days that will end on Monday.  Need new dosage instruction and refill

## 2015-08-31 ENCOUNTER — Encounter: Payer: Self-pay | Admitting: Cardiology

## 2015-08-31 ENCOUNTER — Ambulatory Visit (INDEPENDENT_AMBULATORY_CARE_PROVIDER_SITE_OTHER): Payer: 59 | Admitting: Cardiology

## 2015-08-31 VITALS — BP 126/80 | HR 70 | Ht 72.0 in | Wt 216.4 lb

## 2015-08-31 DIAGNOSIS — I493 Ventricular premature depolarization: Secondary | ICD-10-CM

## 2015-08-31 DIAGNOSIS — Z5181 Encounter for therapeutic drug level monitoring: Secondary | ICD-10-CM

## 2015-08-31 LAB — BASIC METABOLIC PANEL
BUN: 11 mg/dL (ref 7–25)
CHLORIDE: 104 mmol/L (ref 98–110)
CO2: 23 mmol/L (ref 20–31)
CREATININE: 1.45 mg/dL — AB (ref 0.70–1.25)
Calcium: 9 mg/dL (ref 8.6–10.3)
Glucose, Bld: 86 mg/dL (ref 65–99)
POTASSIUM: 4.2 mmol/L (ref 3.5–5.3)
Sodium: 140 mmol/L (ref 135–146)

## 2015-08-31 NOTE — Progress Notes (Signed)
08/31/2015 Patrick Weaver   09-Oct-1945  [id number]  Primary Physician Anthoney Harada, MD Primary Cardiologist: Dr. [name] Electrophysiologist: Dr. Caryl Comes   Reason for Visit/CC: [facility] F/u for Symptomatic PVCs + Systolic HF  HPI:  70 y/o male with h/o symptomatic bradycardia w/p PPM. He has also had significant problems with PVCs, and was treated with Flecainide. He had a LHC in Aug 2014 which demonstrated no significant coronary disease. EF was 65%. He also has AI and an ascending aortic aneurysm. Recent chest CT 07/28/15 showed stable aortic diameter of 51mm. 2D echo also 07/2015 showed severely reduced LVEF of 25-30%. This was significantly reduced compared to prior study in which EF was normal at 50-55%. His AI was noted to be mild to moderate with regurgitation directed centrally in the LVOT. The left atrium was severely dilated.   He was seen in clinic by Dr. [name] in f/u on 07/28/15. His PPM was interrogated and he was noted to have frequent PVCs. It was felt that his reduced LVEF was likely tachy mediated. As a result, his Flecainide was discontinued and he was started on amiodarone. He was instructed to take 400 mg BID x 2 weeks>>200 mg BID x 2 weeks>>200 mg daily. For prior to starting the medication at home he presented to [facility] emergency department on 08/12/2015 with complaint of exertional dyspnea and palpitations. He was noted to have frequent PVCs on telemetry in the ED. He was admitted for symptomatic PVCs. He was started on IV amiodarone and transition to by mouth. His carvedilol was also increased to 12.5 mg twice a day. Low to P was discontinued and he was started on Avapro.   On 8/13 he was transitioned over to 400mg  amiodarone BID with plans to continue this for 2 weeks, then 200mg  BID for 2 weeks and then 200mg  daily. His BP was stable with the addition of Avapro 150mg , but no room to add spironolactone. He was observed without any further complaints and telemetry  noted a stable HR and continued PVCs with decreased frequency.   He was discharged on August 14. He presents back to clinic for two-week follow-up. He is here with his wife. Since discharge he has noticed improvement with less frequent palpitations. EKG shows sinus rhythm with 2 PVCs. QT/QTC is stable at 426/460 ms. He is tolerating his new medications well without side effects. No increased dyspnea and no wheezing. Blood pressure is stable  at126/80. Pulse rate is 60 bpm.   Current Outpatient Prescriptions  Medication Sig Dispense Refill  . acetaminophen (TYLENOL) 325 MG tablet Take 2 tablets (650 mg total) by mouth every 6 (six) hours as needed for mild pain (or Fever >/= 101). 30 tablet 0  . amiodarone (PACERONE) 200 MG tablet Take 2 tablets (400 mg total) by mouth 2 (two) times daily. For 14 days 56 tablet 0  . amiodarone (PACERONE) 200 MG tablet Take 200 mg twice daily for 14 days, then take 200 mg daily (Patient taking differently: Take 200 mg by mouth daily. ) 104 tablet 0  . aspirin EC 81 MG EC tablet Take 1 tablet (81 mg total) by mouth daily.    Marland Kitchen atorvastatin (LIPITOR) 10 MG tablet TAKE 1 TABLET (10 MG TOTAL) BY MOUTH DAILY. 90 tablet 1  . carvedilol (COREG) 12.5 MG tablet Take 1 tablet (12.5 mg total) by mouth 2 (two) times daily with a meal. 60 tablet 11  . docusate sodium (COLACE) 100 MG capsule Take 1 capsule (100 mg  total) by mouth 2 (two) times daily. 30 capsule 0  . gabapentin (NEURONTIN) 300 MG capsule Take 300 mg by mouth 2 (two) times daily.    . irbesartan (AVAPRO) 150 MG tablet Take 1 tablet (150 mg total) by mouth daily. 30 tablet 11  . tamsulosin (FLOMAX) 0.4 MG CAPS capsule Take 0.4 mg by mouth daily.     No current facility-administered medications for this visit.     Allergies  Allergen Reactions  . Codeine Nausea Only    Social History   Social History  . Marital status: Married    Spouse name: N/A  . Number of children: 3  . Years of education: N/A    Occupational History  . Not on file.   Social History Main Topics  . Smoking status: Never Smoker  . Smokeless tobacco: Never Used  . Alcohol use No  . Drug use: No  . Sexual activity: Not on file   Other Topics Concern  . Not on file   Social History Narrative   Married, 3 daughters. 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 Drinks 3 caffeinated beverages/day.      Review of Systems: General: negative for chills, fever, night sweats or weight changes.  Cardiovascular: negative for chest pain, dyspnea on exertion, edema, orthopnea, palpitations, paroxysmal nocturnal dyspnea or shortness of breath Dermatological: negative for rash Respiratory: negative for cough or wheezing Urologic: negative for hematuria Abdominal: negative for nausea, vomiting, diarrhea, bright red blood per rectum, melena, or hematemesis Neurologic: negative for visual changes, syncope, or dizziness All other systems reviewed and are otherwise negative except as noted above.    Blood pressure 126/80, pulse 70, height 6' (1.829 m), weight 216 lb 6.4 oz (98.2 kg).  General appearance: alert, cooperative and no distress Neck: no carotid bruit and no JVD Lungs: clear to auscultation bilaterally Heart: regular rate and rhythm and few PVCs Extremities: extremities normal, atraumatic, no cyanosis or edema Pulses: 2+ and symmetric Skin: Skin color, texture, turgor normal. No rashes or lesions Neurologic: Grossly normal  EKG SR with PVCs 70 bpm.   ASSESSMENT AND PLAN:    1. Symptomatic PVCs: recent device interrogation revealed frequent PVCs. Flecainide stopped 07/28/15 for severe LV dysfunction. Patient admitted for symptomatic PVCs 8/11/7 0 - exertional dyspnea, CP, palpitations, dizziness and near syncope. Telemetry and EKG showed frequent PCVs in a pattern of bigeminy. He was loaded with IV amiodarone and transitioned to PO with improvement. Less frequent PVCs. Continue amiodarone load. He has  transitioned from 400 mg BID down to 200 mg BID. He will continue this x 2 weeks then transition to 200 mg daily. Will need f/u TSH and HFTs in 3-6 months. Continue BB therapy with Coreg.    2. Cardiomyopathy/ LV Systolic Dysfunction: recent echo 07/22/15 showed reduced LVEF down to 25-30%, previously 50-55%. This is suspected to be tachy mediated in the setting of high PVC burden. He had a LHC 4 years ago, 08/2011, which showed normal LM, mild luminal irregularities in the LAD, small but normal diagonals, normal LCx/ OM1, OM2, mild luminal irregularities in the RCA with only 25% stenosis after the AM.  PDA moderate sized and normal. Continue Coreg 12.5 mg BID. Continue ARB therapy. Volume is stable. Low sodium diet advised.   3. PPM: implanted for symptomatic bradycardia. Followed by Dr. Caryl Comes.   4. Aortic Insufficiency: recent echo 07/2015 showed mild to moderate regurgitation directed centrally in the LVOT.  5. Ascending Aortic Aneurysm: followed by CT. Recent study  07/22/15 showed stable dimensions measuring at 42 mm (previous study in 2014 was measured at 45 mm).   6. OSA: on CPAP. Followed by Dr. Radford Pax.   7. HTN: controlled with ARB + BB.   8. HLD: continue home statin, Lipitor 10 mg.    PLAN  F/u with Dr. [name] in 4-6 weeks  Lyda Jester PA-C 08/31/2015 11:27 AM

## 2015-08-31 NOTE — Patient Instructions (Addendum)
Your physician recommends that you continue on your current medications as directed. Please refer to the Current Medication list given to you today. Your physician recommends that you return for lab work in:  Today Artist)  Your physician recommends that you schedule a follow-up appointment in: 6-8 weeks with Dr. [name].   Friday Oct 20, 8:30 am

## 2015-09-02 ENCOUNTER — Telehealth: Payer: Self-pay | Admitting: Cardiology

## 2015-09-02 DIAGNOSIS — R7989 Other specified abnormal findings of blood chemistry: Secondary | ICD-10-CM

## 2015-09-02 NOTE — Telephone Encounter (Signed)
Follow Up:     Returning your call,concerning lab results.

## 2015-09-06 DIAGNOSIS — H2513 Age-related nuclear cataract, bilateral: Secondary | ICD-10-CM | POA: Diagnosis not present

## 2015-09-06 DIAGNOSIS — H5203 Hypermetropia, bilateral: Secondary | ICD-10-CM | POA: Diagnosis not present

## 2015-09-06 NOTE — Telephone Encounter (Signed)
Returned pts call and left another message for pt to call back re: lab results.

## 2015-09-06 NOTE — Telephone Encounter (Signed)
-----   Message from [place], [place] sent at 09/02/2015  3:34 PM EDT ----- Slight bump in SCr (measure of kidney function) after starting Avapro. Recommend repeating BMP in 7-10 days to ensure no further increase.

## 2015-09-06 NOTE — Telephone Encounter (Signed)
Pts wife, Butch Penny, [place] on file, has been made aware of pts lab results. Pt will come 09/09/15 for repeat bmet. She verbalized understanding. Order in [place].

## 2015-09-09 ENCOUNTER — Other Ambulatory Visit: Payer: 59 | Admitting: *Deleted

## 2015-09-09 DIAGNOSIS — R7989 Other specified abnormal findings of blood chemistry: Secondary | ICD-10-CM

## 2015-09-09 LAB — BASIC METABOLIC PANEL
BUN: 16 mg/dL (ref 7–25)
CHLORIDE: 107 mmol/L (ref 98–110)
CO2: 23 mmol/L (ref 20–31)
CREATININE: 1.67 mg/dL — AB (ref 0.70–1.25)
Calcium: 9 mg/dL (ref 8.6–10.3)
Glucose, Bld: 98 mg/dL (ref 65–99)
POTASSIUM: 4 mmol/L (ref 3.5–5.3)
SODIUM: 140 mmol/L (ref 135–146)

## 2015-09-09 NOTE — Telephone Encounter (Signed)
Please close Encounter °

## 2015-09-12 ENCOUNTER — Other Ambulatory Visit: Payer: Self-pay | Admitting: *Deleted

## 2015-09-12 DIAGNOSIS — R748 Abnormal levels of other serum enzymes: Secondary | ICD-10-CM

## 2015-09-13 ENCOUNTER — Telehealth: Payer: Self-pay | Admitting: Internal Medicine

## 2015-09-13 DIAGNOSIS — R7989 Other specified abnormal findings of blood chemistry: Secondary | ICD-10-CM

## 2015-09-13 NOTE — Telephone Encounter (Signed)
New Message  Pts wife voiced she is wanting to speak with nurse about lab results of her husband.  Please f/u with pts wife

## 2015-09-13 NOTE — Telephone Encounter (Signed)
Copied from notes on lab results from 09/08/15:  Notes Recorded by Almyra Deforest, PA on 09/09/2015 at 11:46 PM EDT Kidney function deteriorated some, his medication list has been reviewed, I do not see any obvious drug that are toxic to the his. Recommend continue current medication and repeat BMET in 3 weeks.    09/13/15--See note by Isaac Laud 09/09/15 regarding kidney function.  I spoke with pt's wife, she is concerned about increasing creatinine with no obvious cause.

## 2015-09-13 NOTE — Telephone Encounter (Signed)
Pt's wife states creatinine 08/14/15 -1.18, creatinine 09/09/15 -1.67.  Pt's wife is concerned about increasingly abnormal creatinine and is asking if Dr [name] would order a nephrology consult for further evaluation. Pt's wife advised I will forward to Dr [name] for review.

## 2015-09-15 NOTE — Telephone Encounter (Signed)
Refer to urology Lenna Sciara Colodonato if possible)

## 2015-09-15 NOTE — Telephone Encounter (Signed)
Order placed for referral to Dr. Arty Baumgartner. Left detailed voice mail message to inform patient's wife, Butch Penny.

## 2015-09-26 ENCOUNTER — Other Ambulatory Visit: Payer: 59 | Admitting: *Deleted

## 2015-09-26 DIAGNOSIS — R748 Abnormal levels of other serum enzymes: Secondary | ICD-10-CM

## 2015-09-26 LAB — BASIC METABOLIC PANEL
BUN: 15 mg/dL (ref 7–25)
CALCIUM: 9.2 mg/dL (ref 8.6–10.3)
CHLORIDE: 106 mmol/L (ref 98–110)
CO2: 25 mmol/L (ref 20–31)
Creat: 1.48 mg/dL — ABNORMAL HIGH (ref 0.70–1.25)
GLUCOSE: 100 mg/dL — AB (ref 65–99)
POTASSIUM: 4.3 mmol/L (ref 3.5–5.3)
SODIUM: 141 mmol/L (ref 135–146)

## 2015-10-10 ENCOUNTER — Encounter: Payer: Self-pay | Admitting: Internal Medicine

## 2015-10-19 ENCOUNTER — Other Ambulatory Visit: Payer: Self-pay | Admitting: Internal Medicine

## 2015-10-21 ENCOUNTER — Ambulatory Visit (INDEPENDENT_AMBULATORY_CARE_PROVIDER_SITE_OTHER): Payer: 59 | Admitting: *Deleted

## 2015-10-21 ENCOUNTER — Ambulatory Visit (INDEPENDENT_AMBULATORY_CARE_PROVIDER_SITE_OTHER): Payer: 59 | Admitting: Internal Medicine

## 2015-10-21 ENCOUNTER — Encounter: Payer: Self-pay | Admitting: Internal Medicine

## 2015-10-21 VITALS — BP 122/74 | HR 75 | Ht 72.0 in | Wt 215.0 lb

## 2015-10-21 DIAGNOSIS — I495 Sick sinus syndrome: Secondary | ICD-10-CM

## 2015-10-21 DIAGNOSIS — I493 Ventricular premature depolarization: Secondary | ICD-10-CM | POA: Diagnosis not present

## 2015-10-21 DIAGNOSIS — Z950 Presence of cardiac pacemaker: Secondary | ICD-10-CM

## 2015-10-21 DIAGNOSIS — R5383 Other fatigue: Secondary | ICD-10-CM | POA: Diagnosis not present

## 2015-10-21 DIAGNOSIS — R0602 Shortness of breath: Secondary | ICD-10-CM | POA: Diagnosis not present

## 2015-10-21 DIAGNOSIS — E7800 Pure hypercholesterolemia, unspecified: Secondary | ICD-10-CM | POA: Diagnosis not present

## 2015-10-21 DIAGNOSIS — Z79899 Other long term (current) drug therapy: Secondary | ICD-10-CM

## 2015-10-21 LAB — CUP PACEART INCLINIC DEVICE CHECK
Battery Voltage: 2.77 V
Brady Statistic AP VS Percent: 93 %
Brady Statistic AS VP Percent: 0 %
Date Time Interrogation Session: 20171020103629
Implantable Lead Implant Date: 20090316
Implantable Lead Location: 753859
Implantable Lead Model: 5076
Lead Channel Impedance Value: 484 Ohm
Lead Channel Pacing Threshold Amplitude: 0.5 V
Lead Channel Pacing Threshold Pulse Width: 0.4 ms
Lead Channel Pacing Threshold Pulse Width: 0.4 ms
Lead Channel Setting Pacing Amplitude: 2 V
Lead Channel Setting Pacing Amplitude: 2.5 V
Lead Channel Setting Sensing Sensitivity: 5.6 mV
MDC IDC LEAD IMPLANT DT: 20090316
MDC IDC LEAD LOCATION: 753860
MDC IDC MSMT BATTERY IMPEDANCE: 1133 Ohm
MDC IDC MSMT BATTERY REMAINING LONGEVITY: 52 mo
MDC IDC MSMT LEADCHNL RA IMPEDANCE VALUE: 382 Ohm
MDC IDC MSMT LEADCHNL RA PACING THRESHOLD AMPLITUDE: 0.5 V
MDC IDC MSMT LEADCHNL RV SENSING INTR AMPL: 11.2 mV
MDC IDC SET LEADCHNL RV PACING PULSEWIDTH: 0.4 ms
MDC IDC STAT BRADY AP VP PERCENT: 4 %
MDC IDC STAT BRADY AS VS PERCENT: 2 %

## 2015-10-21 LAB — COMPREHENSIVE METABOLIC PANEL
ALK PHOS: 86 U/L (ref 40–115)
ALT: 29 U/L (ref 9–46)
AST: 27 U/L (ref 10–35)
Albumin: 4.6 g/dL (ref 3.6–5.1)
BILIRUBIN TOTAL: 0.9 mg/dL (ref 0.2–1.2)
BUN: 19 mg/dL (ref 7–25)
CALCIUM: 9.4 mg/dL (ref 8.6–10.3)
CO2: 25 mmol/L (ref 20–31)
Chloride: 105 mmol/L (ref 98–110)
Creat: 1.58 mg/dL — ABNORMAL HIGH (ref 0.70–1.25)
Glucose, Bld: 86 mg/dL (ref 65–99)
POTASSIUM: 4.4 mmol/L (ref 3.5–5.3)
Sodium: 141 mmol/L (ref 135–146)
TOTAL PROTEIN: 6.8 g/dL (ref 6.1–8.1)

## 2015-10-21 LAB — TSH: TSH: 3.19 m[IU]/L (ref 0.40–4.50)

## 2015-10-21 LAB — LIPID PANEL
CHOLESTEROL: 114 mg/dL — AB (ref 125–200)
HDL: 31 mg/dL — ABNORMAL LOW (ref >=40)
LDL Cholesterol: 58 mg/dL (ref <130)
TRIGLYCERIDES: 123 mg/dL (ref <150)
Total CHOL/HDL Ratio: 3.7 Ratio (ref <=5.0)
VLDL: 25 mg/dL (ref <30)

## 2015-10-21 LAB — CBC
HCT: 42.7 % (ref 38.5–50.0)
Hemoglobin: 14.7 g/dL (ref 13.2–17.1)
MCH: 31.1 pg (ref 27.0–33.0)
MCHC: 34.4 g/dL (ref 32.0–36.0)
MCV: 90.3 fL (ref 80.0–100.0)
MPV: 10.7 fL (ref 7.5–12.5)
PLATELETS: 148 10*3/uL (ref 140–400)
RBC: 4.73 MIL/uL (ref 4.20–5.80)
RDW: 13.6 % (ref 11.0–15.0)
WBC: 6.4 10*3/uL (ref 3.8–10.8)

## 2015-10-21 MED ORDER — CARVEDILOL 6.25 MG PO TABS: 6.2500 mg | ORAL_TABLET | Freq: Two times a day (BID) | ORAL | 3 refills | Status: DC

## 2015-10-21 NOTE — Patient Instructions (Signed)
Medication Instructions:  Please decrease your Carvedilol to 6.25 mg twice a day. Continue all other medications as listed.  Labwork: Please have blood work today (BNP, CMP, TSH, CBC and Lipid)  Follow-Up: Follow up as directed to by Dr [name].  If you need a refill on your cardiac medications before your next appointment, please call your pharmacy.  Thank you for choosing [facility]!!

## 2015-10-21 NOTE — Progress Notes (Signed)
Pacemaker check in clinic, added-on per Dr. [name] for symptomatic PVCs (N/C). PVC counter indicates decreased burden since starting amiodarone, symptoms not improved per Dr. [name]. Vp 4% (MVP on).  Normal device function. Thresholds, sensing, impedances consistent with previous measurements. Device programmed to maximize longevity. No mode switch or high ventricular rates noted. Device programmed at appropriate safety margins. Histogram distribution appropriate for patient activity level. Device programmed to optimize intrinsic conduction. Estimated longevity 4.5 years. Patient enrolled in remote follow-up. Patient education completed. Overdue for f/u with SK, patient to schedule appt at check-out.

## 2015-10-21 NOTE — Progress Notes (Signed)
Cardiology Office Note   Date:  10/21/2015   ID:  BREYLIN CARLYON, DOB February 06, 1945, MRN [id number]  PCP:  Anthoney Harada, MD  Cardiologist:   Dorris Carnes, MD    F/U of NICM and PVCs   History of Present Illness: Patrick Weaver is a 70 y.o. male with a history ofPPM. He has also had significant problems with PVCs, and was treated with Flecainide. He had a LHC in Aug 2014 which demonstrated no significant coronary disease. EF was 65%. He also has AI and an ascending aortic aneurysm. Recent chest CT 07/28/15 showed stable aortic diameter of 79mm. 2D echo also 07/2015 showed severely reduced LVEF of 25-30%. This was significantly reduced compared to prior study in which EF was normal at 50-55%. His AI was noted to be mild to moderate with regurgitation directed centrally in the LVOT. The left atrium was severely dilated.   When I saw the pt in July interrogation of PPM showed freq PVCs.  Reviewed with [name].  Flecanide d/c'd and pt placed on amiodarone, initially IV then PO  (AUg, 2017)  Since seen he continues to feel bad.  Still SOB  With activity he gets chest tightness.  Stops and eases  Feels SOB even with watching TV      Outpatient Medications Prior to Visit  Medication Sig Dispense Refill  . acetaminophen (TYLENOL) 325 MG tablet Take 2 tablets (650 mg total) by mouth every 6 (six) hours as needed for mild pain (or Fever >/= 101). 30 tablet 0  . amiodarone (PACERONE) 200 MG tablet Take 2 tablets (400 mg total) by mouth 2 (two) times daily. For 14 days (Patient taking differently: Take 200 mg by mouth daily. ) 56 tablet 0  . aspirin EC 81 MG EC tablet Take 1 tablet (81 mg total) by mouth daily.    Marland Kitchen atorvastatin (LIPITOR) 10 MG tablet TAKE 1 TABLET (10 MG TOTAL) BY MOUTH DAILY. 90 tablet 1  . carvedilol (COREG) 12.5 MG tablet Take 1 tablet (12.5 mg total) by mouth 2 (two) times daily with a meal. 60 tablet 11  . docusate sodium (COLACE) 100 MG capsule Take 1 capsule (100 mg  total) by mouth 2 (two) times daily. (Patient taking differently: Take 100 mg by mouth daily as needed. ) 30 capsule 0  . gabapentin (NEURONTIN) 300 MG capsule Take 300 mg by mouth 2 (two) times daily.    . irbesartan (AVAPRO) 150 MG tablet Take 1 tablet (150 mg total) by mouth daily. 30 tablet 11  . tamsulosin (FLOMAX) 0.4 MG CAPS capsule Take 0.4 mg by mouth daily as needed.     Marland Kitchen amiodarone (PACERONE) 200 MG tablet Take 200 mg twice daily for 14 days, then take 200 mg daily (Patient taking differently: Take 200 mg by mouth daily. ) 104 tablet 0   No facility-administered medications prior to visit.      Allergies:   Codeine   Past Medical History:  Diagnosis Date  . Bladder stone   . BPH (benign prostatic hyperplasia)   . Bradycardia    treated with pacemaker placement- Dr. Virl Axe MD  . Dyslipidemia    failed niaspan  . HTN (hypertension)   . Pacemaker reprogramming/check   . PACEMAKER-[employer]tr[employer] 08/31/2008   Qualifier: Diagnosis of  By: Caryl Comes, MD, Remus Blake   . Palpitations   . PVCs (premature ventricular contractions)   . Sleep apnea    USES C-PAP    Past Surgical History:  Procedure Laterality Date  . CYSTOSCOPY WITH LITHOLAPAXY N/A 07/12/2014   Procedure: CYSTOSCOPY WITH LITHOLAPAXY;  Surgeon: Raynelle Bring, MD;  Location: [facility];  Service: Urology;  Laterality: N/A;  . EP IMPLANTABLE DEVICE  2009  . TONSILLECTOMY    . TRANSURETHRAL RESECTION OF PROSTATE N/A 07/12/2014   Procedure: TRANSURETHRAL RESECTION OF THE PROSTATE (TURP);  Surgeon: Raynelle Bring, MD;  Location: [facility];  Service: Urology;  Laterality: N/A;     Social History:  The patient  reports that he has never smoked. He has never used smokeless tobacco. He reports that he does not drink alcohol or use drugs.   Family History:  The patient's family history includes Cancer in his mother; Colon cancer in his maternal grandfather; Congestive Heart Failure in his father; Heart attack in his  father; Heart disease in his father; Hypertension in his brother and mother.    ROS:  Please see the history of present illness. All other systems are reviewed and  Negative to the above problem except as noted.    PHYSICAL EXAM: VS:  BP 122/74   Pulse 75   Ht 6' (1.829 m)   Wt 215 lb (97.5 kg)   BMI 29.16 kg/m   GEN: Well nourished, well developed, in no acute distress  HEENT: normal  Neck: no JVD, carotid bruits, or masses Cardiac: RRR; no murmurs, rubs, or gallops,no edema  Respiratory:  clear to auscultation bilaterally, normal work of breathing GI: soft, nontender, nondistended, + BS  No hepatomegaly  MS: no deformity Moving all extremities   Skin: warm and dry, no rash Neuro:  Strength and sensation are intact Psych: euthymic mood, full affect   EKG:  EKG is not  ordered today.   Lipid Panel    Component Value Date/Time   CHOL 115 (L) 05/06/2015 0839   TRIG 151 (H) 05/06/2015 0839   HDL 26 (L) 05/06/2015 0839   CHOLHDL 4.4 05/06/2015 0839   VLDL 30 05/06/2015 0839   LDLCALC 59 05/06/2015 0839   LDLDIRECT 98.6 05/30/2006 0856      Wt Readings from Last 3 Encounters:  10/21/15 215 lb (97.5 kg)  08/31/15 216 lb 6.4 oz (98.2 kg)  08/15/15 203 lb 9.6 oz (92.4 kg)      ASSESSMENT AND PLAN: 1  Chronic systolic CHF  Volume status is pretty good   Symptoms are concerning  Interrogation of PPM shows PVC frequency has gone down. He had cath in 2014 that showed only mild CAD  ? If changged   I will review with [name].  ? Repeat cath vs CT Keep on amio for now.  Check labs  2.  PVCs  As above  3  Bradycardia  S/p PPM     Current medicines are reviewed at length with the patient today.  The patient does not have concerns regarding medicines.  Signed, Dorris Carnes, MD  10/21/2015 8:37 AM    [address], [place], [place]  [id number] Phone: [phone]; Fax: [phone]

## 2015-10-25 ENCOUNTER — Other Ambulatory Visit: Payer: Self-pay | Admitting: Nephrology

## 2015-10-25 DIAGNOSIS — N183 Chronic kidney disease, stage 3 unspecified: Secondary | ICD-10-CM

## 2015-10-25 DIAGNOSIS — E1022 Type 1 diabetes mellitus with diabetic chronic kidney disease: Secondary | ICD-10-CM

## 2015-10-28 ENCOUNTER — Ambulatory Visit
Admission: RE | Admit: 2015-10-28 | Discharge: 2015-10-28 | Disposition: A | Discharge status: Home / Self Care | Payer: 59 | Source: Ambulatory Visit | Attending: Nephrology | Admitting: Nephrology

## 2015-10-28 DIAGNOSIS — N183 Chronic kidney disease, stage 3 unspecified: Secondary | ICD-10-CM

## 2015-10-28 DIAGNOSIS — E1022 Type 1 diabetes mellitus with diabetic chronic kidney disease: Secondary | ICD-10-CM

## 2015-11-07 ENCOUNTER — Telehealth: Payer: Self-pay | Admitting: Internal Medicine

## 2015-11-07 DIAGNOSIS — I359 Nonrheumatic aortic valve disorder, unspecified: Secondary | ICD-10-CM

## 2015-11-07 DIAGNOSIS — I519 Heart disease, unspecified: Secondary | ICD-10-CM

## 2015-11-07 DIAGNOSIS — I493 Ventricular premature depolarization: Secondary | ICD-10-CM

## 2015-11-07 NOTE — Telephone Encounter (Signed)
Follow Up:     Pt was waiting to hear something from Dr [name] after she talked to Dr Caryl Comes.He saw Dr [name] on 10-21-15,he still have not heard anything.

## 2015-11-09 NOTE — Telephone Encounter (Signed)
Would recomm limited echo to reevaluate LV function Set up for holter monitor at that time to confirm PVC burden over 24 hours

## 2015-11-10 NOTE — Telephone Encounter (Signed)
Spoke with patient's wife and explained that Dr. [name] is ordering a limited echo and a 24 hr holter monitor to eval LV function and PVC burden.  She asked that we call him on his mobile # [phone].

## 2015-11-16 ENCOUNTER — Emergency Department (HOSPITAL_BASED_OUTPATIENT_CLINIC_OR_DEPARTMENT_OTHER)
Admission: EM | Admit: 2015-11-16 | Discharge: 2015-11-16 | Disposition: A | Discharge status: Home / Self Care | Payer: 59 | Attending: Emergency Medicine | Admitting: Emergency Medicine

## 2015-11-16 ENCOUNTER — Encounter (HOSPITAL_BASED_OUTPATIENT_CLINIC_OR_DEPARTMENT_OTHER): Payer: Self-pay | Admitting: Emergency Medicine

## 2015-11-16 DIAGNOSIS — I5021 Acute systolic (congestive) heart failure: Secondary | ICD-10-CM | POA: Insufficient documentation

## 2015-11-16 DIAGNOSIS — Z950 Presence of cardiac pacemaker: Secondary | ICD-10-CM | POA: Insufficient documentation

## 2015-11-16 DIAGNOSIS — Z7982 Long term (current) use of aspirin: Secondary | ICD-10-CM | POA: Diagnosis not present

## 2015-11-16 DIAGNOSIS — R040 Epistaxis: Secondary | ICD-10-CM | POA: Diagnosis not present

## 2015-11-16 DIAGNOSIS — I110 Hypertensive heart disease with heart failure: Secondary | ICD-10-CM | POA: Insufficient documentation

## 2015-11-16 NOTE — Discharge Instructions (Signed)
Treatment: Apply Vaseline or Neosporin to the outer, inside of your nose at that time. You may also find that a humidifier in urinary midnight is helpful.  Follow-up: Please follow-up with the ear nose and throat doctor, Dr. Benjamine Mola, for further evaluation and treatment of your symptoms. Please return to emergency department if you develop any new or worsening symptoms.

## 2015-11-16 NOTE — ED Notes (Signed)
ED Provider at bedside. 

## 2015-11-16 NOTE — ED Provider Notes (Signed)
[facility] DEPT [facility] Provider Note   CSN: [id number] Arrival date & time: 11/16/15  [id number]     History   Chief Complaint Chief Complaint  Patient presents with  . Epistaxis    HPI Patrick Weaver is a 70 y.o. male with history of hypertension, sleep apnea with CPAP use who presents with epistaxis. Patient reports he has had intermittent nosebleeds for the past 6 months. Patient reports he can usually get it controlled within 1 hour. Patient reports he began having a nosebleed around 7 AM this morning and blood around an hour and stopped, however it began again. Patient's nose stopped bleeding around 5-10 minutes prior to arrival today. He has been using pressure to try and stop. Patient has not been evaluated for this before. Patient is not on anticoagulants. Patient denies any pain. Patient reports his nosebleeds only out of the right near. He denies any chest pain, shortness of breath, abdominal pain, nausea, vomiting, urinary symptoms.  HPI  Past Medical History:  Diagnosis Date  . Bladder stone   . BPH (benign prostatic hyperplasia)   . Bradycardia    treated with pacemaker placement- Dr. Virl Axe MD  . Dyslipidemia    failed niaspan  . HTN (hypertension)   . Pacemaker reprogramming/check   . PACEMAKER-[employer] 08/31/2008   Qualifier: Diagnosis of  By: Caryl Comes, MD, Remus Blake   . Palpitations   . PVCs (premature ventricular contractions)   . Sleep apnea    USES C-PAP    Patient Active Problem List   Diagnosis Date Noted  . Acute systolic heart failure ([place]) 08/13/2015  . Left ventricular systolic dysfunction [id number]  . DOE (dyspnea on exertion) 08/12/2015  . Frequent PVCs 08/12/2015  . Benign prostatic hyperplasia (BPH) with straining on urination 07/12/2014  . Complicated UTI (urinary tract infection) 06/25/2014  . Testicular pain 06/25/2014  . Essential hypertension 06/25/2014  . Leukocytosis   . Urinary tract infection 06/24/2014  . PRECORDIAL  PAIN 08/31/2008  . PACEMAKER-[employer] 08/31/2008  . Aneurysm of thoracic aorta ([place]) 07/06/2008  . Hyperlipidemia 04/22/2008  . Aortic valve disorder 03/24/2008  . PVC's (premature ventricular contractions) 03/24/2008  . 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 PULMONARY NODULE 03/25/2007  . Sleep apnea 03/24/2007    Past Surgical History:  Procedure Laterality Date  . CYSTOSCOPY WITH LITHOLAPAXY N/A 07/12/2014   Procedure: CYSTOSCOPY WITH LITHOLAPAXY;  Surgeon: Raynelle Bring, MD;  Location: [facility];  Service: Urology;  Laterality: N/A;  . EP IMPLANTABLE DEVICE  2009  . TONSILLECTOMY    . TRANSURETHRAL RESECTION OF PROSTATE N/A 07/12/2014   Procedure: TRANSURETHRAL RESECTION OF THE PROSTATE (TURP);  Surgeon: Raynelle Bring, MD;  Location: [facility];  Service: Urology;  Laterality: N/A;       Home Medications    Prior to Admission medications   Medication Sig Start Date End Date Taking? Authorizing Provider  acetaminophen (TYLENOL) 325 MG tablet Take 2 tablets (650 mg total) by mouth every 6 (six) hours as needed for mild pain (or Fever >/= 101). 06/26/14   Robbie Lis, MD  amiodarone (PACERONE) 200 MG tablet Take 2 tablets (400 mg total) by mouth 2 (two) times daily. For 14 days Patient taking differently: Take 200 mg by mouth daily.  08/03/15   Fay Records, MD  aspirin EC 81 MG EC tablet Take 1 tablet (81 mg total) by mouth daily. 08/15/15   Cheryln Manly, NP  atorvastatin (LIPITOR) 10 MG tablet TAKE 1  TABLET (10 MG TOTAL) BY MOUTH DAILY. 08/11/15   Fay Records, MD  carvedilol (COREG) 6.25 MG tablet Take 1 tablet (6.25 mg total) by mouth 2 (two) times daily. 10/21/15   Fay Records, MD  docusate sodium (COLACE) 100 MG capsule Take 1 capsule (100 mg total) by mouth 2 (two) times daily. Patient taking differently: Take 100 mg by mouth daily as needed.  07/12/14   Raynelle Bring, MD  gabapentin (NEURONTIN) 300 MG capsule Take 300 mg by mouth 2 (two) times  daily.    Historical Provider, MD  irbesartan (AVAPRO) 150 MG tablet Take 1 tablet (150 mg total) by mouth daily. 08/15/15   Cheryln Manly, NP  tamsulosin (FLOMAX) 0.4 MG CAPS capsule Take 0.4 mg by mouth daily as needed.  06/28/15   Historical Provider, MD    Family History Family History  Problem Relation Age of Onset  . Hypertension Mother   . Cancer Mother   . Congestive Heart Failure Father   . Heart disease Father   . Heart attack Father   . Colon cancer Maternal Grandfather   . Hypertension Brother   . Stomach cancer Neg Hx   . Esophageal cancer Neg Hx     Social History Social History  Substance Use Topics  . Smoking status: Never Smoker  . Smokeless tobacco: Never Used  . Alcohol use No     Allergies   Codeine   Review of Systems Review of Systems  Constitutional: Negative for chills and fever.  HENT: Positive for nosebleeds. Negative for facial swelling and sore throat.   Respiratory: Negative for shortness of breath.   Cardiovascular: Negative for chest pain.  Gastrointestinal: Negative for abdominal pain, nausea and vomiting.  Genitourinary: Negative for dysuria.  Musculoskeletal: Negative for back pain.  Skin: Negative for rash and wound.  Neurological: Negative for headaches.  Psychiatric/Behavioral: The patient is not nervous/anxious.      Physical Exam Updated Vital Signs BP 141/86 (BP Location: Left Arm)   Pulse 78   Temp 97.5 F (36.4 C) (Oral)   Resp 18   Ht 6' (1.829 m)   Wt 95.3 kg   SpO2 96%   BMI 28.48 kg/m   Physical Exam  Constitutional: He appears well-developed and well-nourished. No distress.  HENT:  Head: Normocephalic and atraumatic.  Nose: No nasal deformity or nasal septal hematoma. Epistaxis (no active bleeding) is observed.  No foreign bodies.  Mouth/Throat: Oropharynx is clear and moist. No oropharyngeal exudate.  Eyes: Conjunctivae are normal. Pupils are equal, round, and reactive to light. Right eye exhibits no  discharge. Left eye exhibits no discharge. No scleral icterus.  Neck: Normal range of motion. Neck supple. No thyromegaly present.  Cardiovascular: Normal rate, regular rhythm, normal heart sounds and intact distal pulses.  Exam reveals no gallop and no friction rub.   No murmur heard. Pulmonary/Chest: Effort normal and breath sounds normal. No stridor. No respiratory distress. He has no wheezes. He has no rales.  Abdominal: Soft. Bowel sounds are normal. He exhibits no distension. There is no tenderness. There is no rebound and no guarding.  Musculoskeletal: He exhibits no edema.  Lymphadenopathy:    He has no cervical adenopathy.  Neurological: He is alert. Coordination normal.  Skin: Skin is warm and dry. No rash noted. He is not diaphoretic. No pallor.  Psychiatric: He has a normal mood and affect.  Nursing note and vitals reviewed.    ED Treatments / Results  Labs (all labs  ordered are listed, but only abnormal results are displayed) Labs Reviewed - No data to display  EKG  EKG Interpretation None       Radiology No results found.  Procedures Procedures (including critical care time)  Medications Ordered in ED Medications - No data to display   Initial Impression / Assessment and Plan / ED Course  I have reviewed the triage vital signs and the nursing notes.  Pertinent labs & imaging results that were available during my care of the patient were reviewed by me and considered in my medical decision making (see chart for details).  Clinical Course     Patient with epistaxis that stopped spontaneously. No intervention was needed. Patient has history of high blood pressure, so Afrin was considered but was not necessary or indicated. Patient is feeling well without any bleeding in the ED. Patient to follow-up with ENT for further evaluation and treatment. Patient advised to use Vaseline or Neosporin to his nares prior to bedtime and also use a humidifier. Patient  understands and agrees with plan. Patient vitals stable throughout ED course discharged in satisfactory condition. Patient also evaluated by Dr. Tyrone Nine who guided the patient's management and agrees with plan.  Final Clinical Impressions(s) / ED Diagnoses   Final diagnoses:  Epistaxis    New Prescriptions New Prescriptions   No medications on file     Frederica Kuster, PA-C 11/16/15 [address] [place], DO 11/16/15 1248

## 2015-11-16 NOTE — ED Triage Notes (Signed)
Pt reports nose bleeds around 0700 then again around 0800. Reports bleeding only from the right nare. Bleeding now controlled.

## 2015-11-23 ENCOUNTER — Other Ambulatory Visit: Payer: Self-pay | Admitting: Internal Medicine

## 2015-11-30 ENCOUNTER — Ambulatory Visit (INDEPENDENT_AMBULATORY_CARE_PROVIDER_SITE_OTHER): Payer: 59

## 2015-11-30 DIAGNOSIS — I493 Ventricular premature depolarization: Secondary | ICD-10-CM

## 2015-12-02 ENCOUNTER — Ambulatory Visit (HOSPITAL_COMMUNITY): Payer: 59 | Attending: Cardiovascular Disease

## 2015-12-02 ENCOUNTER — Other Ambulatory Visit: Payer: Self-pay

## 2015-12-02 DIAGNOSIS — I359 Nonrheumatic aortic valve disorder, unspecified: Secondary | ICD-10-CM | POA: Diagnosis not present

## 2015-12-02 DIAGNOSIS — I371 Nonrheumatic pulmonary valve insufficiency: Secondary | ICD-10-CM | POA: Diagnosis not present

## 2015-12-02 DIAGNOSIS — I493 Ventricular premature depolarization: Secondary | ICD-10-CM | POA: Insufficient documentation

## 2015-12-02 DIAGNOSIS — I509 Heart failure, unspecified: Secondary | ICD-10-CM | POA: Insufficient documentation

## 2015-12-02 DIAGNOSIS — I071 Rheumatic tricuspid insufficiency: Secondary | ICD-10-CM | POA: Diagnosis not present

## 2015-12-02 DIAGNOSIS — I519 Heart disease, unspecified: Secondary | ICD-10-CM

## 2015-12-02 DIAGNOSIS — I712 Thoracic aortic aneurysm, without rupture: Secondary | ICD-10-CM | POA: Diagnosis not present

## 2015-12-02 DIAGNOSIS — I110 Hypertensive heart disease with heart failure: Secondary | ICD-10-CM | POA: Diagnosis not present

## 2015-12-02 DIAGNOSIS — E785 Hyperlipidemia, unspecified: Secondary | ICD-10-CM | POA: Insufficient documentation

## 2015-12-06 ENCOUNTER — Telehealth: Payer: Self-pay | Admitting: Cardiology

## 2015-12-06 NOTE — Telephone Encounter (Signed)
Spoke w/ Arnell Sieving from [facility]. Pt was there to get a monitor placed per orders from Dr. [name] and they need additional information about pt PPM. After consulting with medical recorders released this information to [facility]. Gave upper and lower rates and mode of PPM.

## 2015-12-07 ENCOUNTER — Encounter: Payer: Self-pay | Admitting: Internal Medicine

## 2015-12-07 ENCOUNTER — Telehealth: Payer: Self-pay | Admitting: Internal Medicine

## 2015-12-07 NOTE — Telephone Encounter (Signed)
New message      Calling to get echo and monitor resutls

## 2015-12-07 NOTE — Telephone Encounter (Signed)
I spoke with Butch Penny, pt's wife (on [place]).  I advised the results are not released to [place] yet, will call back when available. She voiced understanding.

## 2015-12-08 NOTE — Telephone Encounter (Signed)
I called and spoke with Mr. Bodiford, reviewed results of echo and holter.  He would like an appointment to discuss and follow up with either Dr. [name] or Dr. Caryl Comes. Message sent to Dr. [name] for review.

## 2015-12-14 ENCOUNTER — Telehealth: Payer: Self-pay | Admitting: Internal Medicine

## 2015-12-14 ENCOUNTER — Other Ambulatory Visit: Payer: Self-pay | Admitting: Internal Medicine

## 2015-12-14 NOTE — Telephone Encounter (Signed)
carvedilol (COREG) 6.25 MG tablet    [id number] - [place], [place] [address] [phone]  Patient needs refills sent in

## 2015-12-21 ENCOUNTER — Telehealth: Payer: Self-pay | Admitting: Internal Medicine

## 2015-12-21 NOTE — Telephone Encounter (Signed)
Reviewed test results with Patrick Weaver LVEF down  Pacing a low percentage of time  Rare PVCs  Would recomm L heart cath to redefine anatomy, explain why LVEF is down.  Pt agreeable  Would like to pursure after the holidays.  He will call

## 2016-01-27 ENCOUNTER — Telehealth: Payer: Self-pay | Admitting: Internal Medicine

## 2016-01-27 NOTE — Telephone Encounter (Signed)
New message       Pt c/o BP issue: STAT if pt c/o blurred vision, one-sided weakness or slurred speech  1. What are your last 5 BP readings?   143/92, 151/100, 142/92----pulse is regular in the 60-70's  2. Are you having any other symptoms (ex. Dizziness, headache, blurred vision, passed out)?  Headache and pt "felt" bad  3. What is your BP issue?  Wife is a [employer] scientist.  She states pts bp is high. Please advise

## 2016-01-27 NOTE — Telephone Encounter (Signed)
Spoke with patient. Had dizziness and headache yesterday, not relieved by tylenol.  BPs were elevated as noted below. Today still slight headache and BP this am still 140s-100  Has not missed any doses of medications.  Eats out almost every meal due to wife's recent surgery has not been cooking.  Eats bacon a couple times per week and drinks a couple soft drinks and sweet tea during the day.  Not much water.  Advised to continue to record BP am and pm and keep a record.  Advised to try to choose low salt foods when eating out and drink mostly water and no soft drinks.  He is aware I am forwarding to Dr. [name] and will call him back with any further recommendations.  Also in regard to cath--he is ready to schedule for the week of Feb 5--advised we will set this up but he may need an OV first and will need lab work.    Sees Dr. Marval Regal for his kidneys.  Takes Irbesartan

## 2016-01-30 NOTE — Telephone Encounter (Signed)
Watch salt He could go up on metorplol to 25 2x per day

## 2016-02-02 ENCOUNTER — Telehealth: Payer: Self-pay | Admitting: *Deleted

## 2016-02-02 DIAGNOSIS — Z01812 Encounter for preprocedural laboratory examination: Secondary | ICD-10-CM

## 2016-02-02 NOTE — Telephone Encounter (Incomplete)
Scheduled patient for left heart cath on 2//9/18.  11am, with Dr. Saunders Revel.

## 2016-02-02 NOTE — Telephone Encounter (Signed)
Fay Records, MD at 12/21/2015 12:52 AM   Status: Signed    Reviewed test results with Patrick Weaver LVEF down  Pacing a low percentage of time  Rare PVCs  Would recomm L heart cath to redefine anatomy, explain why LVEF is down.  Pt agreeable  Would like to pursure after the holidays.  He will call       Planning to schedule heart cath week of [name] now, due to pt's wife still being unable to drive.  Scheduled patient with Dr. Harrington Weaver on 2/8 ov-needs up to date history and physical.  He can have labs that same day.  Pt's wife states he feels much better and his BP is back in the normal range.  They have no changed any medicines.  Will continue to monitor this and if any concerns they will call back.

## 2016-02-06 ENCOUNTER — Other Ambulatory Visit: Payer: Self-pay | Admitting: Internal Medicine

## 2016-02-09 ENCOUNTER — Encounter: Payer: Self-pay | Admitting: *Deleted

## 2016-02-09 ENCOUNTER — Encounter: Payer: Self-pay | Admitting: Internal Medicine

## 2016-02-09 ENCOUNTER — Ambulatory Visit (INDEPENDENT_AMBULATORY_CARE_PROVIDER_SITE_OTHER): Payer: 59 | Admitting: Internal Medicine

## 2016-02-09 VITALS — BP 134/78 | HR 72 | Ht 72.0 in | Wt 221.0 lb

## 2016-02-09 DIAGNOSIS — Z01812 Encounter for preprocedural laboratory examination: Secondary | ICD-10-CM

## 2016-02-09 NOTE — Patient Instructions (Addendum)
Your physician recommends that you continue on your current medications as directed. Please refer to the Current Medication list given to you today.  Your physician recommends that you return for lab work today (BMET, CBC, [place])  Your physician has requested that you have a cardiac catheterization. Cardiac catheterization is used to diagnose and/or treat various heart conditions. Doctors may recommend this procedure for a number of different reasons. The most common reason is to evaluate chest pain. Chest pain can be a symptom of coronary artery disease (CAD), and cardiac catheterization can show whether plaque is narrowing or blocking your heart's arteries. This procedure is also used to evaluate the valves, as well as measure the blood flow and oxygen levels in different parts of your heart. For further information please visit [email or web address]. Please follow instruction sheet, as given.  Follow up with your physician will depend on test results.

## 2016-02-09 NOTE — Progress Notes (Signed)
Cardiology Office Note   Date:  02/09/2016   ID:  Patrick Weaver, DOB [birth year], MRN [id number]  PCP:  Anthoney Harada, MD  Cardiologist:   Dorris Carnes, MD    Pt presents for precath eval    History of Present Illness: Patrick Weaver is a 71 y.o. male with a history of PVCs  Rx flecanide in the past   LHC in 2014 showed no signif CAD  EF normal   Pt also has history of  aortic insuffic  CT in 07/2015 stable aorta of 42 mm  LVEF on echo was 25 to 30%   This was signif down from previous  AI was mild to moderate.   Interrogation of pacer showed freq PVCs  Flecanide discontinued and pt placed on amiodarone  This was successful in suppressing PVCs  But repeat echo in the fall 2017 showed no improvement in the LVEF   Interrogation of pacemaker showed pacing was infrequent    Pt continues to get SOB with activity with some chest pressure   Based on continued symptoms and severe LV dysfunction pt presents for precath eval  Recomm L heart cath to redefine coronary anatomy, r/o CAD as etiology of LV dysfunction        Current Meds  Medication Sig  . acetaminophen (TYLENOL) 325 MG tablet Take 2 tablets (650 mg total) by mouth every 6 (six) hours as needed for mild pain (or Fever >/= 101).  Marland Kitchen amiodarone (PACERONE) 200 MG tablet Take 1 tablet (200 mg total) by mouth daily.  Marland Kitchen aspirin EC 81 MG EC tablet Take 1 tablet (81 mg total) by mouth daily.  Marland Kitchen atorvastatin (LIPITOR) 10 MG tablet TAKE 1 TABLET (10 MG TOTAL) BY MOUTH DAILY.  . carvedilol (COREG) 6.25 MG tablet Take 1 tablet (6.25 mg total) by mouth 2 (two) times daily.  Marland Kitchen docusate sodium (COLACE) 100 MG capsule Take 1 capsule (100 mg total) by mouth 2 (two) times daily. (Patient taking differently: Take 100 mg by mouth daily as needed. )  . gabapentin (NEURONTIN) 300 MG capsule Take 300 mg by mouth 2 (two) times daily.  . irbesartan (AVAPRO) 150 MG tablet Take 1 tablet (150 mg total) by mouth daily.  . tamsulosin (FLOMAX) 0.4 MG CAPS  capsule Take 0.4 mg by mouth daily as needed.      Allergies:   Codeine   Past Medical History:  Diagnosis Date  . Bladder stone   . BPH (benign prostatic hyperplasia)   . Bradycardia    treated with pacemaker placement- Dr. Virl Axe MD  . Dyslipidemia    failed niaspan  . HTN (hypertension)   . Pacemaker reprogramming/check   . PACEMAKER-[employer] 08/31/2008   Qualifier: Diagnosis of  By: Caryl Comes, MD, Remus Blake   . Palpitations   . PVCs (premature ventricular contractions)   . Sleep apnea    USES C-PAP    Past Surgical History:  Procedure Laterality Date  . CYSTOSCOPY WITH LITHOLAPAXY N/A 07/12/2014   Procedure: CYSTOSCOPY WITH LITHOLAPAXY;  Surgeon: Raynelle Bring, MD;  Location: [facility];  Service: Urology;  Laterality: N/A;  . EP IMPLANTABLE DEVICE  2009  . TONSILLECTOMY    . TRANSURETHRAL RESECTION OF PROSTATE N/A 07/12/2014   Procedure: TRANSURETHRAL RESECTION OF THE PROSTATE (TURP);  Surgeon: Raynelle Bring, MD;  Location: [facility];  Service: Urology;  Laterality: N/A;     Social History:  The patient  reports that he has never smoked. He has never used  smokeless tobacco. He reports that he does not drink alcohol or use drugs.   Family History:  The patient's family history includes Cancer in his mother; Colon cancer in his maternal grandfather; Congestive Heart Failure in his father; Heart attack in his father; Heart disease in his father; Hypertension in his brother and mother.    ROS:  Please see the history of present illness. All other systems are reviewed and  Negative to the above problem except as noted.    PHYSICAL EXAM: VS:  BP 134/78   Pulse 72   Ht 6' (1.829 m)   Wt 221 lb (100.2 kg)   BMI 29.97 kg/m   GEN: Well nourished, well developed, in no acute distress  HEENT: normal  Neck: no JVD, carotid bruits, or masses Cardiac: RRR; no murmurs, rubs, or gallops,no edema  Respiratory:  clear to auscultation bilaterally, normal work of  breathing GI: soft, nontender, nondistended, + BS  No hepatomegaly  MS: no deformity Moving all extremities   Skin: warm and dry, no rash Neuro:  Strength and sensation are intact Psych: euthymic mood, full affect   EKG:  EKG is ordered today.  Atrial paced 72 bpm  PR 276 msec    Lipid Panel    Component Value Date/Time   CHOL 114 (L) 10/21/2015 0941   TRIG 123 10/21/2015 0941   HDL 31 (L) 10/21/2015 0941   CHOLHDL 3.7 10/21/2015 0941   VLDL 25 10/21/2015 0941   LDLCALC 58 10/21/2015 0941   LDLDIRECT 98.6 05/30/2006 0856      Wt Readings from Last 3 Encounters:  02/09/16 221 lb (100.2 kg)  11/16/15 210 lb (95.3 kg)  10/21/15 215 lb (97.5 kg)      ASSESSMENT AND PLAN:  1  Chronic systolic heart failure  LVEF down from previous  Volume status is OK  Would recomm L heart cath to define anatomy  Will get labs today  Plan for cath next week  Hydrate given renal dysfunciton   2.  PVCs  Continue amiodarone  3  Hx bradycardia  S/p PPM  4  HTN  BP control fair  5  Aortic aneurysm  Will need to be followed periodically in future    6  Renal insuff  Pt followed in renal clinc  Will Hold ARB a couple days prior to test Hydrate morning of with cath mid day     F/U based on test results  Current medicines are reviewed at length with the patient today.  The patient does not have concerns regarding medicines.  Signed, Dorris Carnes, MD  02/09/2016 9:09 AM    [address], [place], [place]  [address] Phone: [phone]; Fax: [phone]

## 2016-02-10 ENCOUNTER — Encounter (HOSPITAL_COMMUNITY): Payer: Self-pay

## 2016-02-10 ENCOUNTER — Ambulatory Visit (HOSPITAL_COMMUNITY): Admit: 2016-02-10 | Payer: 59 | Admitting: Internal Medicine

## 2016-02-10 LAB — PROTIME-INR
INR: 1 (ref 0.8–1.2)
Prothrombin Time: 10.9 s (ref 9.1–12.0)

## 2016-02-10 LAB — CBC
HEMATOCRIT: 45.3 % (ref 37.5–51.0)
HEMOGLOBIN: 15.5 g/dL (ref 13.0–17.7)
MCH: 30.6 pg (ref 26.6–33.0)
MCHC: 34.2 g/dL (ref 31.5–35.7)
MCV: 89 fL (ref 79–97)
Platelets: 136 10*3/uL — ABNORMAL LOW (ref 150–379)
RBC: 5.07 x10E6/uL (ref 4.14–5.80)
RDW: 13.5 % (ref 12.3–15.4)
WBC: 5.2 10*3/uL (ref 3.4–10.8)

## 2016-02-10 LAB — BASIC METABOLIC PANEL
BUN/Creatinine Ratio: 9 — ABNORMAL LOW (ref 10–24)
BUN: 13 mg/dL (ref 8–27)
CALCIUM: 9.9 mg/dL (ref 8.6–10.2)
CO2: 22 mmol/L (ref 18–29)
Chloride: 102 mmol/L (ref 96–106)
Creatinine, Ser: 1.45 mg/dL — ABNORMAL HIGH (ref 0.76–1.27)
GFR, EST AFRICAN AMERICAN: 57 mL/min/{1.73_m2} — AB (ref >59)
GFR, EST NON AFRICAN AMERICAN: 49 mL/min/{1.73_m2} — AB (ref >59)
Glucose: 102 mg/dL — ABNORMAL HIGH (ref 65–99)
Potassium: 4.4 mmol/L (ref 3.5–5.2)
Sodium: 143 mmol/L (ref 134–144)

## 2016-02-10 SURGERY — LEFT HEART CATH AND CORONARY ANGIOGRAPHY
Anesthesia: LOCAL

## 2016-02-15 ENCOUNTER — Encounter (HOSPITAL_COMMUNITY)
Admission: RE | Disposition: A | Discharge status: Home / Self Care | Payer: Self-pay | Source: Ambulatory Visit | Attending: Cardiovascular Disease

## 2016-02-15 ENCOUNTER — Ambulatory Visit (HOSPITAL_COMMUNITY)
Admission: RE | Admit: 2016-02-15 | Discharge: 2016-02-15 | Disposition: A | Discharge status: Home / Self Care | Payer: 59 | Source: Ambulatory Visit | Attending: Cardiovascular Disease | Admitting: Cardiovascular Disease

## 2016-02-15 DIAGNOSIS — N289 Disorder of kidney and ureter, unspecified: Secondary | ICD-10-CM | POA: Insufficient documentation

## 2016-02-15 DIAGNOSIS — I110 Hypertensive heart disease with heart failure: Secondary | ICD-10-CM | POA: Diagnosis not present

## 2016-02-15 DIAGNOSIS — E785 Hyperlipidemia, unspecified: Secondary | ICD-10-CM | POA: Diagnosis not present

## 2016-02-15 DIAGNOSIS — G4730 Sleep apnea, unspecified: Secondary | ICD-10-CM | POA: Insufficient documentation

## 2016-02-15 DIAGNOSIS — Z7982 Long term (current) use of aspirin: Secondary | ICD-10-CM | POA: Diagnosis not present

## 2016-02-15 DIAGNOSIS — Z8249 Family history of ischemic heart disease and other diseases of the circulatory system: Secondary | ICD-10-CM | POA: Insufficient documentation

## 2016-02-15 DIAGNOSIS — I428 Other cardiomyopathies: Secondary | ICD-10-CM

## 2016-02-15 DIAGNOSIS — I719 Aortic aneurysm of unspecified site, without rupture: Secondary | ICD-10-CM | POA: Diagnosis not present

## 2016-02-15 DIAGNOSIS — Z950 Presence of cardiac pacemaker: Secondary | ICD-10-CM | POA: Insufficient documentation

## 2016-02-15 DIAGNOSIS — I5022 Chronic systolic (congestive) heart failure: Secondary | ICD-10-CM | POA: Diagnosis not present

## 2016-02-15 DIAGNOSIS — I493 Ventricular premature depolarization: Secondary | ICD-10-CM | POA: Insufficient documentation

## 2016-02-15 DIAGNOSIS — N400 Enlarged prostate without lower urinary tract symptoms: Secondary | ICD-10-CM | POA: Diagnosis not present

## 2016-02-15 HISTORY — PX: LEFT HEART CATH AND CORONARY ANGIOGRAPHY: CATH118249

## 2016-02-15 SURGERY — LEFT HEART CATH AND CORONARY ANGIOGRAPHY

## 2016-02-15 MED ORDER — SODIUM CHLORIDE 0.9% FLUSH: 3.0000 mL | INTRAVENOUS | Status: DC | PRN

## 2016-02-15 MED ORDER — SODIUM CHLORIDE 0.9 % IV SOLN: INTRAVENOUS | Status: AC

## 2016-02-15 MED ORDER — VERAPAMIL HCL 2.5 MG/ML IV SOLN
INTRAVENOUS | Status: AC
  Filled 2016-02-15: qty 2

## 2016-02-15 MED ORDER — IOPAMIDOL (ISOVUE-370) INJECTION 76%
INTRAVENOUS | Status: DC | PRN
  Administered 2016-02-15: 70 mL via INTRA_ARTERIAL

## 2016-02-15 MED ORDER — SODIUM CHLORIDE 0.9% FLUSH: 3.0000 mL | Freq: Two times a day (BID) | INTRAVENOUS | Status: DC

## 2016-02-15 MED ORDER — HEPARIN (PORCINE) IN NACL 2-0.9 UNIT/ML-% IJ SOLN
INTRAMUSCULAR | Status: AC
  Filled 2016-02-15: qty 1000

## 2016-02-15 MED ORDER — MIDAZOLAM HCL 2 MG/2ML IJ SOLN
INTRAMUSCULAR | Status: AC
  Filled 2016-02-15: qty 2

## 2016-02-15 MED ORDER — LIDOCAINE HCL (PF) 1 % IJ SOLN
INTRAMUSCULAR | Status: AC
  Filled 2016-02-15: qty 30

## 2016-02-15 MED ORDER — IOPAMIDOL (ISOVUE-370) INJECTION 76%
INTRAVENOUS | Status: AC
  Filled 2016-02-15: qty 100

## 2016-02-15 MED ORDER — HEPARIN SODIUM (PORCINE) 1000 UNIT/ML IJ SOLN
INTRAMUSCULAR | Status: DC | PRN
  Administered 2016-02-15: 5000 [IU] via INTRAVENOUS

## 2016-02-15 MED ORDER — SODIUM CHLORIDE 0.9 % IV SOLN: 250.0000 mL | INTRAVENOUS | Status: DC | PRN

## 2016-02-15 MED ORDER — HEPARIN SODIUM (PORCINE) 1000 UNIT/ML IJ SOLN
INTRAMUSCULAR | Status: AC
  Filled 2016-02-15: qty 1

## 2016-02-15 MED ORDER — MIDAZOLAM HCL 2 MG/2ML IJ SOLN
INTRAMUSCULAR | Status: DC | PRN
  Administered 2016-02-15: 2 mg via INTRAVENOUS

## 2016-02-15 MED ORDER — FENTANYL CITRATE (PF) 100 MCG/2ML IJ SOLN
INTRAMUSCULAR | Status: DC | PRN
  Administered 2016-02-15: 50 ug via INTRAVENOUS

## 2016-02-15 MED ORDER — ASPIRIN 81 MG PO CHEW: 81.0000 mg | CHEWABLE_TABLET | ORAL | Status: DC

## 2016-02-15 MED ORDER — FENTANYL CITRATE (PF) 100 MCG/2ML IJ SOLN
INTRAMUSCULAR | Status: AC
  Filled 2016-02-15: qty 2

## 2016-02-15 MED ORDER — HEPARIN (PORCINE) IN NACL 2-0.9 UNIT/ML-% IJ SOLN
INTRAMUSCULAR | Status: DC | PRN
  Administered 2016-02-15: 1000 mL

## 2016-02-15 MED ORDER — SODIUM CHLORIDE 0.9 % IV SOLN
INTRAVENOUS | Status: DC
  Administered 2016-02-15: 08:00:00 via INTRAVENOUS

## 2016-02-15 MED ORDER — LIDOCAINE HCL (PF) 1 % IJ SOLN
INTRAMUSCULAR | Status: DC | PRN
  Administered 2016-02-15: 2 mL

## 2016-02-15 MED ORDER — HEPARIN (PORCINE) IN NACL 2-0.9 UNIT/ML-% IJ SOLN
INTRAMUSCULAR | Status: DC | PRN
  Administered 2016-02-15: 10 mL via INTRA_ARTERIAL

## 2016-02-15 SURGICAL SUPPLY — 13 items
CATH INFINITI 5 FR JL3.5 (CATHETERS) ×2 IMPLANT
CATH INFINITI 5FR AL1 (CATHETERS) ×2 IMPLANT
CATH INFINITI 5FR ANG PIGTAIL (CATHETERS) ×2 IMPLANT
CATH INFINITI JR4 5F (CATHETERS) ×2 IMPLANT
CATH VISTA GUIDE 6FR XBLAD3.5 (CATHETERS) ×2 IMPLANT
DEVICE RAD COMP TR BAND LRG (VASCULAR PRODUCTS) ×2 IMPLANT
GLIDESHEATH SLEND SS 6F .021 (SHEATH) ×2 IMPLANT
GUIDEWIRE INQWIRE 1.5J.035X260 (WIRE) ×1 IMPLANT
INQWIRE 1.5J .035X260CM (WIRE) ×2
KIT HEART LEFT (KITS) ×2 IMPLANT
PACK CARDIAC CATHETERIZATION (CUSTOM PROCEDURE TRAY) ×2 IMPLANT
TRANSDUCER W/STOPCOCK (MISCELLANEOUS) ×2 IMPLANT
TUBING CIL FLEX 10 FLL-RA (TUBING) ×2 IMPLANT

## 2016-02-15 NOTE — Discharge Instructions (Signed)

## 2016-02-15 NOTE — Interval H&P Note (Signed)
History and Physical Interval Note:  02/15/2016 11:52 AM  Patrick Weaver  has presented today for cardiac cath with the diagnosis of unstable angina. The various methods of treatment have been discussed with the patient and family. After consideration of risks, benefits and other options for treatment, the patient has consented to  Procedure(s): Coronary Angiography (N/A) as a surgical intervention .  The patient's history has been reviewed, patient examined, no change in status, stable for surgery.  I have reviewed the patient's chart and labs.  Questions were answered to the patient's satisfaction.    Cath Lab Visit (complete for each Cath Lab visit)  Clinical Evaluation Leading to the Procedure:   ACS: No.  Non-ACS:    Anginal Classification: CCS II  Anti-ischemic medical therapy: Minimal Therapy (1 class of medications)  Non-Invasive Test Results: No non-invasive testing performed  Prior CABG: No previous CABG         Patrick Weaver

## 2016-02-15 NOTE — H&P (View-Only) (Signed)
Cardiology Office Note   Date:  02/09/2016   ID:  Patrick Weaver, DOB [birth year], MRN [id number]  PCP:  Anthoney Harada, MD  Cardiologist:   Dorris Carnes, MD    Pt presents for precath eval    History of Present Illness: Patrick Weaver is a 71 y.o. male with a history of PVCs  Rx flecanide in the past   LHC in 2014 showed no signif CAD  EF normal   Pt also has history of  aortic insuffic  CT in 07/2015 stable aorta of 42 mm  LVEF on echo was 25 to 30%   This was signif down from previous  AI was mild to moderate.   Interrogation of pacer showed freq PVCs  Flecanide discontinued and pt placed on amiodarone  This was successful in suppressing PVCs  But repeat echo in the fall 2017 showed no improvement in the LVEF   Interrogation of pacemaker showed pacing was infrequent    Pt continues to get SOB with activity with some chest pressure   Based on continued symptoms and severe LV dysfunction pt presents for precath eval  Recomm L heart cath to redefine coronary anatomy, r/o CAD as etiology of LV dysfunction        Current Meds  Medication Sig  . acetaminophen (TYLENOL) 325 MG tablet Take 2 tablets (650 mg total) by mouth every 6 (six) hours as needed for mild pain (or Fever >/= 101).  Marland Kitchen amiodarone (PACERONE) 200 MG tablet Take 1 tablet (200 mg total) by mouth daily.  Marland Kitchen aspirin EC 81 MG EC tablet Take 1 tablet (81 mg total) by mouth daily.  Marland Kitchen atorvastatin (LIPITOR) 10 MG tablet TAKE 1 TABLET (10 MG TOTAL) BY MOUTH DAILY.  . carvedilol (COREG) 6.25 MG tablet Take 1 tablet (6.25 mg total) by mouth 2 (two) times daily.  Marland Kitchen docusate sodium (COLACE) 100 MG capsule Take 1 capsule (100 mg total) by mouth 2 (two) times daily. (Patient taking differently: Take 100 mg by mouth daily as needed. )  . gabapentin (NEURONTIN) 300 MG capsule Take 300 mg by mouth 2 (two) times daily.  . irbesartan (AVAPRO) 150 MG tablet Take 1 tablet (150 mg total) by mouth daily.  . tamsulosin (FLOMAX) 0.4 MG CAPS  capsule Take 0.4 mg by mouth daily as needed.      Allergies:   Codeine   Past Medical History:  Diagnosis Date  . Bladder stone   . BPH (benign prostatic hyperplasia)   . Bradycardia    treated with pacemaker placement- Dr. Virl Axe MD  . Dyslipidemia    failed niaspan  . HTN (hypertension)   . Pacemaker reprogramming/check   . PACEMAKER-[employer] 08/31/2008   Qualifier: Diagnosis of  By: Caryl Comes, MD, Remus Blake   . Palpitations   . PVCs (premature ventricular contractions)   . Sleep apnea    USES C-PAP    Past Surgical History:  Procedure Laterality Date  . CYSTOSCOPY WITH LITHOLAPAXY N/A 07/12/2014   Procedure: CYSTOSCOPY WITH LITHOLAPAXY;  Surgeon: Raynelle Bring, MD;  Location: [facility];  Service: Urology;  Laterality: N/A;  . EP IMPLANTABLE DEVICE  2009  . TONSILLECTOMY    . TRANSURETHRAL RESECTION OF PROSTATE N/A 07/12/2014   Procedure: TRANSURETHRAL RESECTION OF THE PROSTATE (TURP);  Surgeon: Raynelle Bring, MD;  Location: [facility];  Service: Urology;  Laterality: N/A;     Social History:  The patient  reports that he has never smoked. He has never used  smokeless tobacco. He reports that he does not drink alcohol or use drugs.   Family History:  The patient's family history includes Cancer in his mother; Colon cancer in his maternal grandfather; Congestive Heart Failure in his father; Heart attack in his father; Heart disease in his father; Hypertension in his brother and mother.    ROS:  Please see the history of present illness. All other systems are reviewed and  Negative to the above problem except as noted.    PHYSICAL EXAM: VS:  BP 134/78   Pulse 72   Ht 6' (1.829 m)   Wt 221 lb (100.2 kg)   BMI 29.97 kg/m   GEN: Well nourished, well developed, in no acute distress  HEENT: normal  Neck: no JVD, carotid bruits, or masses Cardiac: RRR; no murmurs, rubs, or gallops,no edema  Respiratory:  clear to auscultation bilaterally, normal work of  breathing GI: soft, nontender, nondistended, + BS  No hepatomegaly  MS: no deformity Moving all extremities   Skin: warm and dry, no rash Neuro:  Strength and sensation are intact Psych: euthymic mood, full affect   EKG:  EKG is ordered today.  Atrial paced 72 bpm  PR 276 msec    Lipid Panel    Component Value Date/Time   CHOL 114 (L) 10/21/2015 0941   TRIG 123 10/21/2015 0941   HDL 31 (L) 10/21/2015 0941   CHOLHDL 3.7 10/21/2015 0941   VLDL 25 10/21/2015 0941   LDLCALC 58 10/21/2015 0941   LDLDIRECT 98.6 05/30/2006 0856      Wt Readings from Last 3 Encounters:  02/09/16 221 lb (100.2 kg)  11/16/15 210 lb (95.3 kg)  10/21/15 215 lb (97.5 kg)      ASSESSMENT AND PLAN:  1  Chronic systolic heart failure  LVEF down from previous  Volume status is OK  Would recomm L heart cath to define anatomy  Will get labs today  Plan for cath next week  Hydrate given renal dysfunciton   2.  PVCs  Continue amiodarone  3  Hx bradycardia  S/p PPM  4  HTN  BP control fair  5  Aortic aneurysm  Will need to be followed periodically in future    6  Renal insuff  Pt followed in renal clinc  Will Hold ARB a couple days prior to test Hydrate morning of with cath mid day     F/U based on test results  Current medicines are reviewed at length with the patient today.  The patient does not have concerns regarding medicines.  Signed, Dorris Carnes, MD  02/09/2016 9:09 AM    [address], [place], [place]  [address] Phone: [phone]; Fax: [phone]

## 2016-02-16 ENCOUNTER — Encounter (HOSPITAL_COMMUNITY): Payer: Self-pay | Admitting: Cardiovascular Disease

## 2016-02-17 ENCOUNTER — Telehealth: Payer: Self-pay | Admitting: Internal Medicine

## 2016-02-17 NOTE — Telephone Encounter (Signed)
PT's wife called pt needs a kidney lab function test  Week of 20.  Per his check out.   Wife thinks he needs orders for this please give her a call.

## 2016-02-21 NOTE — Telephone Encounter (Signed)
Follow Up    Per notes below: PT's wife called pt needs a kidney lab function test  Week of 20.  Per his check out.   Wife thinks he needs orders for this please give her a call.  Follow Up 02/21/16: Pt wife still has not received a call back. Wife would like to know if he needs orders or what he should do about this.

## 2016-02-21 NOTE — Telephone Encounter (Signed)
Spoke to pt. He started back taking his Avapro a few days ago.  He and his wife are concerned about kidney function post cath and wondering if he can have his creatinine checked sometime soon or if Dr. [name] feels this is even necessary since not very much dye was used.  Advised I would send to MD and call back with her recommendations.

## 2016-02-22 NOTE — Telephone Encounter (Signed)
Please set patient up for BMET to confirm kidney function is OK

## 2016-02-23 NOTE — Telephone Encounter (Signed)
Pt will come tomorrow morning for lab work confirm kidney function.  Appointment made.

## 2016-02-24 ENCOUNTER — Other Ambulatory Visit: Payer: 59 | Admitting: *Deleted

## 2016-02-24 DIAGNOSIS — Z01812 Encounter for preprocedural laboratory examination: Secondary | ICD-10-CM

## 2016-02-24 LAB — BASIC METABOLIC PANEL
BUN/Creatinine Ratio: 9 — ABNORMAL LOW (ref 10–24)
BUN: 11 mg/dL (ref 8–27)
CALCIUM: 8.9 mg/dL (ref 8.6–10.2)
CHLORIDE: 104 mmol/L (ref 96–106)
CO2: 24 mmol/L (ref 18–29)
Creatinine, Ser: 1.28 mg/dL — ABNORMAL HIGH (ref 0.76–1.27)
GFR calc non Af Amer: 57 mL/min/{1.73_m2} — ABNORMAL LOW (ref >59)
GFR, EST AFRICAN AMERICAN: 66 mL/min/{1.73_m2} (ref >59)
Glucose: 91 mg/dL (ref 65–99)
Potassium: 4.4 mmol/L (ref 3.5–5.2)
Sodium: 143 mmol/L (ref 134–144)

## 2016-03-07 ENCOUNTER — Telehealth: Payer: Self-pay | Admitting: Internal Medicine

## 2016-03-07 NOTE — Telephone Encounter (Signed)
New Message   Pt states that Dr. [name] mentioned switching medicines and was going to speak to the neurologist Dr. Dava Najjar. He spoke to his neurologist and he suggested that the pt enrolled in an exercise program and would like to know if this would be okay given his medical history.

## 2016-03-07 NOTE — Telephone Encounter (Signed)
Patient recently saw Dr. Marval Regal and had a couple things for Dr. [name]: 1) Dr. Marval Regal mentioned starting an exercise program and would like Dr. [name]' clearance to start. 2) Dr. Marval Regal informed the patient he may start Entresto as long as Irbesartan is d/c'd.  He understands he will be called with Dr. [name]' instructions.   Dr. Elissa Hefty note received and placed in Dr. [name]' box for review.

## 2016-04-03 ENCOUNTER — Encounter: Payer: Self-pay | Admitting: Internal Medicine

## 2016-04-05 ENCOUNTER — Telehealth: Payer: Self-pay | Admitting: Internal Medicine

## 2016-04-05 DIAGNOSIS — I428 Other cardiomyopathies: Secondary | ICD-10-CM

## 2016-04-05 MED ORDER — SACUBITRIL-VALSARTAN 24-26 MG PO TABS: 1.0000 | ORAL_TABLET | Freq: Two times a day (BID) | ORAL | 11 refills | Status: DC

## 2016-04-05 NOTE — Telephone Encounter (Signed)
Spoke with patient.  He took Avapro this am.  Advised to discontinue Avapro and to start Entresto 24-26 mg on Saturday 04/07/16 -one pill every 12 hrs.  Pt verbalizes understanding of this.  Scheduled follow up appointment with Dr. [name] on 05/04/16.  Scheduled lab appointment BMET for 04/17/16.

## 2016-04-05 NOTE — Addendum Note (Signed)
Addended by: Rodman Key on: 04/05/2016 01:07 PM   Modules accepted: Orders

## 2016-04-05 NOTE — Telephone Encounter (Signed)
INstruct pt to stop Avapro   Start Entresto24/26 2x per day  Set f/u in clinic on day I am there for BP check with me In 2 to 4 wks

## 2016-04-17 ENCOUNTER — Other Ambulatory Visit: Payer: 59

## 2016-04-17 ENCOUNTER — Other Ambulatory Visit: Payer: Self-pay | Admitting: *Deleted

## 2016-04-17 DIAGNOSIS — I5022 Chronic systolic (congestive) heart failure: Secondary | ICD-10-CM

## 2016-04-17 DIAGNOSIS — Z01812 Encounter for preprocedural laboratory examination: Secondary | ICD-10-CM

## 2016-04-17 DIAGNOSIS — I428 Other cardiomyopathies: Secondary | ICD-10-CM

## 2016-04-17 LAB — CBC
HEMATOCRIT: 43.8 % (ref 37.5–51.0)
Hemoglobin: 15 g/dL (ref 13.0–17.7)
MCH: 30.5 pg (ref 26.6–33.0)
MCHC: 34.2 g/dL (ref 31.5–35.7)
MCV: 89 fL (ref 79–97)
Platelets: 130 10*3/uL — ABNORMAL LOW (ref 150–379)
RBC: 4.92 x10E6/uL (ref 4.14–5.80)
RDW: 13.8 % (ref 12.3–15.4)
WBC: 4.8 10*3/uL (ref 3.4–10.8)

## 2016-04-17 LAB — PROTIME-INR
INR: 1 (ref 0.8–1.2)
PROTHROMBIN TIME: 11.1 s (ref 9.1–12.0)

## 2016-04-17 LAB — BASIC METABOLIC PANEL
BUN / CREAT RATIO: 9 — AB (ref 10–24)
BUN: 11 mg/dL (ref 8–27)
CHLORIDE: 103 mmol/L (ref 96–106)
CO2: 24 mmol/L (ref 18–29)
Calcium: 9.6 mg/dL (ref 8.6–10.2)
Creatinine, Ser: 1.25 mg/dL (ref 0.76–1.27)
GFR calc Af Amer: 68 mL/min/{1.73_m2} (ref >59)
GFR calc non Af Amer: 59 mL/min/{1.73_m2} — ABNORMAL LOW (ref >59)
GLUCOSE: 95 mg/dL (ref 65–99)
Potassium: 4.5 mmol/L (ref 3.5–5.2)
SODIUM: 142 mmol/L (ref 134–144)

## 2016-05-03 ENCOUNTER — Other Ambulatory Visit: Payer: Self-pay | Admitting: *Deleted

## 2016-05-03 MED ORDER — SACUBITRIL-VALSARTAN 24-26 MG PO TABS: 1.0000 | ORAL_TABLET | Freq: Two times a day (BID) | ORAL | 3 refills | Status: DC

## 2016-05-04 ENCOUNTER — Encounter: Payer: Self-pay | Admitting: Internal Medicine

## 2016-05-04 ENCOUNTER — Ambulatory Visit (INDEPENDENT_AMBULATORY_CARE_PROVIDER_SITE_OTHER): Payer: 59 | Admitting: Internal Medicine

## 2016-05-04 VITALS — BP 116/71 | HR 73 | Ht 72.0 in | Wt 211.8 lb

## 2016-05-04 DIAGNOSIS — I10 Essential (primary) hypertension: Secondary | ICD-10-CM | POA: Diagnosis not present

## 2016-05-04 DIAGNOSIS — I5022 Chronic systolic (congestive) heart failure: Secondary | ICD-10-CM

## 2016-05-04 DIAGNOSIS — I714 Abdominal aortic aneurysm, without rupture, unspecified: Secondary | ICD-10-CM

## 2016-05-04 DIAGNOSIS — I493 Ventricular premature depolarization: Secondary | ICD-10-CM

## 2016-05-04 MED ORDER — AMOXICILLIN 500 MG PO CAPS: 2000.0000 mg | ORAL_CAPSULE | Freq: Once | ORAL | 1 refills | Status: AC

## 2016-05-04 NOTE — Progress Notes (Signed)
Cardiology Office Note   Date:  05/04/2016   ID:  Patrick Weaver, DOB 1945-03-28, MRN [id number]  PCP:  Vernie Shanks, MD  Cardiologist:   Dorris Carnes, MD   Follow up of chronic systolic CHF      History of Present Illness: Patrick Weaver is a 71 y.o. male with a history of PVCs  Rx flecanide in the past   LHC in 2014 showed no signif CAD  EF normal   Pt also has history of  aortic insuffic  CT in 07/2015 stable aorta of 42 mm  LVEF on echo was 25 to 30%   This was signif down from previous  AI was mild to moderate.   Interrogation of pacer showed freq PVCs  Flecanide discontinued and pt placed on amiodarone  This was successful in suppressing PVCs  But repeat echo in the fall 2017 showed no improvement in the LVEF   Interrogation of pacemaker showed pacing was infrequent   He went on to have L heart catheterization  This was done earlier this winter  There was no CAD LVEDP was 21 The pt has been seen by J Colodonato  He was swtched from an ARB to [place].   He says he is feeling good  No Dizziness  Feels he hs more energy  Takes longer to tire  Denies CP or pressure  No palpitations .       Current Meds  Medication Sig  . acetaminophen (TYLENOL) 325 MG tablet Take 2 tablets (650 mg total) by mouth every 6 (six) hours as needed for mild pain (or Fever >/= 101).  Marland Kitchen amiodarone (PACERONE) 200 MG tablet Take 1 tablet (200 mg total) by mouth daily. (Patient taking differently: Take 100 mg by mouth daily. )  . aspirin EC 81 MG EC tablet Take 1 tablet (81 mg total) by mouth daily.  Marland Kitchen atorvastatin (LIPITOR) 10 MG tablet TAKE 1 TABLET (10 MG TOTAL) BY MOUTH DAILY.  . carvedilol (COREG) 6.25 MG tablet Take 1 tablet (6.25 mg total) by mouth 2 (two) times daily.  Marland Kitchen docusate sodium (COLACE) 100 MG capsule Take 1 capsule (100 mg total) by mouth 2 (two) times daily. (Patient taking differently: Take 100 mg by mouth daily as needed. )  . gabapentin (NEURONTIN) 300 MG capsule Take 600 mg by mouth 3  (three) times daily.   . sacubitril-valsartan (ENTRESTO) 24-26 MG Take 1 tablet by mouth 2 (two) times daily.  . tamsulosin (FLOMAX) 0.4 MG CAPS capsule Take 0.4 mg by mouth daily as needed.   . vitamin B-12 (CYANOCOBALAMIN) 1000 MCG tablet Take 1,000 mcg by mouth daily.     Allergies:   Codeine   Past Medical History:  Diagnosis Date  . Bladder stone   . BPH (benign prostatic hyperplasia)   . Bradycardia    treated with pacemaker placement- Dr. Virl Axe MD  . Dyslipidemia    failed niaspan  . HTN (hypertension)   . Pacemaker reprogramming/check   . PACEMAKER-[employer] 08/31/2008   Qualifier: Diagnosis of  By: Caryl Comes, MD, Remus Blake   . Palpitations   . PVCs (premature ventricular contractions)   . Sleep apnea    USES C-PAP    Past Surgical History:  Procedure Laterality Date  . CYSTOSCOPY WITH LITHOLAPAXY N/A 07/12/2014   Procedure: CYSTOSCOPY WITH LITHOLAPAXY;  Surgeon: Raynelle Bring, MD;  Location: [facility];  Service: Urology;  Laterality: N/A;  . EP IMPLANTABLE DEVICE  2009  . LEFT  HEART CATH AND CORONARY ANGIOGRAPHY N/A 02/15/2016   Procedure: Left Heart Cath and Coronary Angiography;  Surgeon: Burnell Blanks, MD;  Location: [facility];  Service: Cardiovascular;  Laterality: N/A;  . TONSILLECTOMY    . TRANSURETHRAL RESECTION OF PROSTATE N/A 07/12/2014   Procedure: TRANSURETHRAL RESECTION OF THE PROSTATE (TURP);  Surgeon: Raynelle Bring, MD;  Location: [facility];  Service: Urology;  Laterality: N/A;     Social History:  The patient  reports that he has never smoked. He has never used smokeless tobacco. He reports that he does not drink alcohol or use drugs.   Family History:  The patient's family history includes Cancer in his mother; Colon cancer in his maternal grandfather; Congestive Heart Failure in his father; Heart attack in his father; Heart disease in his father; Hypertension in his brother and mother.    ROS:  Please see the history of  present illness. All other systems are reviewed and  Negative to the above problem except as noted.    PHYSICAL EXAM: VS:  BP 116/71   Pulse 73   Ht 6' (1.829 m)   Wt 211 lb 12.8 oz (96.1 kg)   BMI 28.73 kg/m   GEN: Well nourished, well developed, in no acute distress  HEENT: normal  Neck: no JVD, carotid bruits, or masses Cardiac: RRR; no murmurs, rubs, or gallops,no edema  Respiratory:  clear to auscultation bilaterally, normal work of breathing GI: soft, nontender, nondistended, + BS  No hepatomegaly  MS: no deformity Moving all extremities   Skin: warm and dry, no rash Neuro:  Strength and sensation are intact Psych: euthymic mood, full affect   EKG:  EKG is not ordered today   Lipid Panel    Component Value Date/Time   CHOL 114 (L) 10/21/2015 0941   TRIG 123 10/21/2015 0941   HDL 31 (L) 10/21/2015 0941   CHOLHDL 3.7 10/21/2015 0941   VLDL 25 10/21/2015 0941   LDLCALC 58 10/21/2015 0941   LDLDIRECT 98.6 05/30/2006 0856      Wt Readings from Last 3 Encounters:  05/04/16 211 lb 12.8 oz (96.1 kg)  02/15/16 215 lb (97.5 kg)  02/09/16 221 lb (100.2 kg)      ASSESSMENT AND PLAN:  1  Chronic systolic heart failure  Nonischemic  Doing good on current regimen  I am reluctant to push further   May make him hypotensive and renal function would suffer  Encouraged him to increase his activity 2.  PVCs  Continue amiodarone  Prob follow limitied echo in the fall    3  Hx bradycardia  S/p PPM  Infrequ pacing    4  HTN  BP control good  5  Aortic aneurysm  Will need to be followed periodically in future  Last CT Aorta was 4.2 cm    6  Renal insuff  Cr improved.  Follows in renal cinic  WIll premed beofre dental work     F/U based on test results  Current medicines are reviewed at length with the patient today.  The patient does not have concerns regarding medicines.  Signed, Dorris Carnes, MD  05/04/2016 11:55 AM    [address], [place], [place]  [address] Phone: [phone]; Fax: [phone]

## 2016-05-04 NOTE — Patient Instructions (Signed)
Your physician recommends that you continue on your current medications as directed. Please refer to the Current Medication list given to you today.  Your physician wants you to follow-up in: 3-4 months (end of August) with Dr. [name].  You will receive a reminder letter in the mail two months in advance. If you don't receive a letter, please call our office to schedule the follow-up appointment.

## 2016-05-09 ENCOUNTER — Encounter: Payer: Self-pay | Admitting: Internal Medicine

## 2016-05-29 ENCOUNTER — Encounter: Payer: Self-pay | Admitting: Internal Medicine

## 2016-05-29 ENCOUNTER — Ambulatory Visit (INDEPENDENT_AMBULATORY_CARE_PROVIDER_SITE_OTHER): Payer: 59 | Admitting: Internal Medicine

## 2016-05-29 VITALS — BP 108/70 | HR 82

## 2016-05-29 DIAGNOSIS — I498 Other specified cardiac arrhythmias: Secondary | ICD-10-CM

## 2016-05-29 DIAGNOSIS — I495 Sick sinus syndrome: Secondary | ICD-10-CM | POA: Diagnosis not present

## 2016-05-29 DIAGNOSIS — R05 Cough: Secondary | ICD-10-CM

## 2016-05-29 DIAGNOSIS — R001 Bradycardia, unspecified: Secondary | ICD-10-CM | POA: Diagnosis not present

## 2016-05-29 DIAGNOSIS — Z79899 Other long term (current) drug therapy: Secondary | ICD-10-CM

## 2016-05-29 DIAGNOSIS — R059 Cough, unspecified: Secondary | ICD-10-CM

## 2016-05-29 DIAGNOSIS — Z950 Presence of cardiac pacemaker: Secondary | ICD-10-CM | POA: Diagnosis not present

## 2016-05-29 DIAGNOSIS — I493 Ventricular premature depolarization: Secondary | ICD-10-CM

## 2016-05-29 LAB — CUP PACEART INCLINIC DEVICE CHECK
Battery Remaining Longevity: 45 mo
Brady Statistic AP VP Percent: 4 %
Brady Statistic AS VS Percent: 1 %
Date Time Interrogation Session: 20180529153852
Implantable Lead Implant Date: 20090316
Implantable Lead Implant Date: 20090316
Implantable Lead Location: 753860
Implantable Pulse Generator Implant Date: 20090316
Lead Channel Impedance Value: 403 Ohm
Lead Channel Impedance Value: 531 Ohm
Lead Channel Pacing Threshold Pulse Width: 0.4 ms
Lead Channel Pacing Threshold Pulse Width: 0.46 ms
Lead Channel Sensing Intrinsic Amplitude: 11.2 mV
Lead Channel Setting Pacing Amplitude: 2 V
Lead Channel Setting Pacing Amplitude: 2.5 V
Lead Channel Setting Pacing Pulse Width: 0.46 ms
Lead Channel Setting Sensing Sensitivity: 5.6 mV
MDC IDC LEAD LOCATION: 753859
MDC IDC MSMT BATTERY IMPEDANCE: 1380 Ohm
MDC IDC MSMT BATTERY VOLTAGE: 2.76 V
MDC IDC MSMT LEADCHNL RA PACING THRESHOLD AMPLITUDE: 0.375 V
MDC IDC MSMT LEADCHNL RA PACING THRESHOLD AMPLITUDE: 0.5 V
MDC IDC MSMT LEADCHNL RA PACING THRESHOLD PULSEWIDTH: 0.4 ms
MDC IDC MSMT LEADCHNL RA SENSING INTR AMPL: 2 mV
MDC IDC MSMT LEADCHNL RV PACING THRESHOLD AMPLITUDE: 0.625 V
MDC IDC MSMT LEADCHNL RV PACING THRESHOLD AMPLITUDE: 0.75 V
MDC IDC MSMT LEADCHNL RV PACING THRESHOLD PULSEWIDTH: 0.4 ms
MDC IDC STAT BRADY AP VS PERCENT: 95 %
MDC IDC STAT BRADY AS VP PERCENT: 0 %

## 2016-05-29 NOTE — Progress Notes (Signed)
Patient Care Team: Vernie Shanks, MD as PCP - General (Family Medicine)   HPI  Patrick Weaver is a 72 y.o. male seen in followup for pacer implantation for bradycardia. He has also had significant problems with pvcs. This haD been treated with flecanide with much improvement. As noted below, there was significant interval decrease in left ventricular function thAT prompted the discontinuation of flecainide and the initiation of amiodarone. PVCs were obliterated but there is no interval improvement in LVEF. Hence he underwent catheterization.   .  Echo 2013 Aortic regurgitation Mild to moderate regurgitation directed centrally  in the LVOT. Regurgitation pressure half-time: 574ms.  Aorta: Ascending aortic aneurysm. Aortic root dimension: 94mm  Ascending aortic diameter: 45 mm by CT 2011   DATE TEST    8/15 Echo   EF 65 %   7/17  Echo   EF 25 %   12/17 Echo EF 25% AI  mild   2/18 Cath  No obstructive CAD      The patient denies chest pain, shortness of breath, nocturnal dyspnea, orthopnea or peripheral edema.  There have been no palpitations, lightheadedness or syncope.  He remains significantly improved.     Date TSH LFTs PFTs  10/17  3.19 29              Past Medical History:  Diagnosis Date  . Bladder stone   . BPH (benign prostatic hyperplasia)   . Bradycardia    treated with pacemaker placement- Dr. Virl Axe MD  . Dyslipidemia    failed niaspan  . HTN (hypertension)   . Pacemaker reprogramming/check   . PACEMAKER-Medtronic 08/31/2008   Qualifier: Diagnosis of  By: Caryl Comes, MD, Remus Blake   . Palpitations   . PVCs (premature ventricular contractions)   . Sleep apnea    USES C-PAP    Past Surgical History:  Procedure Laterality Date  . CYSTOSCOPY WITH LITHOLAPAXY N/A 07/12/2014   Procedure: CYSTOSCOPY WITH LITHOLAPAXY;  Surgeon: Raynelle Bring, MD;  Location: [facility];  Service: Urology;  Laterality: N/A;  . EP IMPLANTABLE DEVICE  2009  .  LEFT HEART CATH AND CORONARY ANGIOGRAPHY N/A 02/15/2016   Procedure: Left Heart Cath and Coronary Angiography;  Surgeon: Burnell Blanks, MD;  Location: [facility];  Service: Cardiovascular;  Laterality: N/A;  . TONSILLECTOMY    . TRANSURETHRAL RESECTION OF PROSTATE N/A 07/12/2014   Procedure: TRANSURETHRAL RESECTION OF THE PROSTATE (TURP);  Surgeon: Raynelle Bring, MD;  Location: [facility];  Service: Urology;  Laterality: N/A;    Current Outpatient Prescriptions  Medication Sig Dispense Refill  . acetaminophen (TYLENOL) 325 MG tablet Take 2 tablets (650 mg total) by mouth every 6 (six) hours as needed for mild pain (or Fever >/= 101). 30 tablet 0  . amiodarone (PACERONE) 200 MG tablet Take 0.5 tablet (100 mg) by mouth daily    . aspirin EC 81 MG EC tablet Take 1 tablet (81 mg total) by mouth daily.    Marland Kitchen atorvastatin (LIPITOR) 10 MG tablet TAKE 1 TABLET (10 MG TOTAL) BY MOUTH DAILY. 90 tablet 2  . carvedilol (COREG) 6.25 MG tablet Take 1 tablet (6.25 mg total) by mouth 2 (two) times daily. 180 tablet 3  . docusate sodium (COLACE) 100 MG capsule Take 100 mg by mouth daily as needed for mild constipation or moderate constipation.    . gabapentin (NEURONTIN) 300 MG capsule Take 600 mg by mouth 2 (two) times daily.     Marland Kitchen  sacubitril-valsartan (ENTRESTO) 24-26 MG Take 1 tablet by mouth 2 (two) times daily. 180 tablet 3  . tamsulosin (FLOMAX) 0.4 MG CAPS capsule Take 0.4 mg by mouth daily as needed.     . vitamin B-12 (CYANOCOBALAMIN) 1000 MCG tablet Take 1,000 mcg by mouth daily.     No current facility-administered medications for this visit.     Allergies  Allergen Reactions  . Codeine Nausea Only    Review of Systems negative except from HPI and PMH  Physical Exam BP 108/70   Pulse 82  Well developed and nourished in no acute distress HENT normal Neck supple with JVP-flat Carotids brisk and full without bruits Clear Regular rate and rhythm, no murmurs or gallops Abd-soft  with active BS without hepatomegaly No Clubbing cyanosis edema Skin-warm and dry A & Oriented  Grossly normal sensory and motor function   ECG Atrial pacing at 74 Intervals 28/12/40 axis -40   Assessment and  Plan  Sinus node dysfunction  Pacemaker-Medtronic The patient's device was interrogated.  The information was reviewed. No changes were made in the programming.    PVCs-amiodarone  HTN    Cough  NICM  Aortic root dilatation with central AI  The patient is euvolemic. We will continue his current medications. I have reached out to Dr. [name] to ask whether he is a candidate for adjunctive spironolactone.  We will check his amiodarone surveillance laboratories. He is having a cough. 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 We will check PFTs with [facility].  His cough may also be related to his Entresto. He is disinclined to stop the latter.  PVC burden remains scant at less than 1% per day.

## 2016-05-29 NOTE — Patient Instructions (Signed)
Medication Instructions: - Your physician recommends that you continue on your current medications as directed. Please refer to the Current Medication list given to you today.  Labwork: - Your physician recommends that you have lab work today: liver/tsh  Procedures/Testing: - Your physician has recommended that you have a pulmonary function test. Pulmonary Function Tests are a group of tests that measure how well air moves in and out of your lungs.  Follow-Up: - Remote monitoring is used to monitor your Pacemaker of ICD from home. This monitoring reduces the number of office visits required to check your device to one time per year. It allows [place] to keep an eye on the functioning of your device to ensure it is working properly. You are scheduled for a device check from home on 08/28/16. You may send your transmission at any time that day. If you have a wireless device, the transmission will be sent automatically. After your physician reviews your transmission, you will receive a postcard with your next transmission date.  - Your physician wants you to follow-up in: 6 months with Chanetta Marshall, NP for Dr. Caryl Comes.  You will receive a reminder letter in the mail two months in advance. If you don't receive a letter, please call our office to schedule the follow-up appointment.   Any Additional Special Instructions Will Be Listed Below (If Applicable).     If you need a refill on your cardiac medications before your next appointment, please call your pharmacy.

## 2016-05-30 LAB — TSH: TSH: 2.47 u[IU]/mL (ref 0.450–4.500)

## 2016-05-30 LAB — HEPATIC FUNCTION PANEL
ALBUMIN: 4.9 g/dL — AB (ref 3.6–4.8)
ALK PHOS: 87 IU/L (ref 39–117)
ALT: 18 IU/L (ref 0–44)
AST: 20 IU/L (ref 0–40)
BILIRUBIN, DIRECT: 0.17 mg/dL (ref 0.00–0.40)
Bilirubin Total: 0.7 mg/dL (ref 0.0–1.2)
TOTAL PROTEIN: 6.7 g/dL (ref 6.0–8.5)

## 2016-05-31 ENCOUNTER — Ambulatory Visit (INDEPENDENT_AMBULATORY_CARE_PROVIDER_SITE_OTHER): Payer: 59 | Admitting: Internal Medicine

## 2016-05-31 DIAGNOSIS — R05 Cough: Secondary | ICD-10-CM | POA: Diagnosis not present

## 2016-05-31 DIAGNOSIS — R059 Cough, unspecified: Secondary | ICD-10-CM

## 2016-05-31 LAB — PULMONARY FUNCTION TEST
DL/VA % pred: 78 %
DL/VA: 3.59 ml/min/mmHg/L
DLCO COR % PRED: 65 %
DLCO COR: 20.97 ml/min/mmHg
DLCO unc % pred: 66 %
DLCO unc: 21.2 ml/min/mmHg
FEF 25-75 POST: 3.29 L/s
FEF 25-75 PRE: 2.63 L/s
FEF2575-%Change-Post: 24 %
FEF2575-%PRED-PRE: 104 %
FEF2575-%Pred-Post: 130 %
FEV1-%Change-Post: 7 %
FEV1-%PRED-PRE: 87 %
FEV1-%Pred-Post: 93 %
FEV1-POST: 3.07 L
FEV1-Pre: 2.87 L
FEV1FVC-%CHANGE-POST: -1 %
FEV1FVC-%Pred-Pre: 106 %
FEV6-%CHANGE-POST: 8 %
FEV6-%PRED-PRE: 86 %
FEV6-%Pred-Post: 93 %
FEV6-POST: 3.93 L
FEV6-Pre: 3.63 L
FEV6FVC-%Change-Post: 0 %
FEV6FVC-%PRED-POST: 105 %
FEV6FVC-%Pred-Pre: 105 %
FVC-%Change-Post: 8 %
FVC-%PRED-PRE: 81 %
FVC-%Pred-Post: 88 %
FVC-PRE: 3.63 L
FVC-Post: 3.94 L
POST FEV6/FVC RATIO: 100 %
PRE FEV1/FVC RATIO: 79 %
Post FEV1/FVC ratio: 78 %
Pre FEV6/FVC Ratio: 100 %
RV % pred: 101 %
RV: 2.44 L
TLC % PRED: 93 %
TLC: 6.5 L

## 2016-05-31 NOTE — Progress Notes (Signed)
PFT done today. 

## 2016-06-15 ENCOUNTER — Other Ambulatory Visit: Payer: Self-pay | Admitting: *Deleted

## 2016-06-15 DIAGNOSIS — R942 Abnormal results of pulmonary function studies: Secondary | ICD-10-CM

## 2016-06-15 DIAGNOSIS — R059 Cough, unspecified: Secondary | ICD-10-CM

## 2016-06-15 DIAGNOSIS — R05 Cough: Secondary | ICD-10-CM

## 2016-06-22 ENCOUNTER — Ambulatory Visit (INDEPENDENT_AMBULATORY_CARE_PROVIDER_SITE_OTHER)
Admission: RE | Admit: 2016-06-22 | Discharge: 2016-06-22 | Disposition: A | Discharge status: Home / Self Care | Payer: 59 | Source: Ambulatory Visit | Attending: Internal Medicine | Admitting: Internal Medicine

## 2016-06-22 ENCOUNTER — Inpatient Hospital Stay: Admission: RE | Admit: 2016-06-22 | Payer: Medicare Other | Source: Ambulatory Visit

## 2016-06-22 DIAGNOSIS — R05 Cough: Secondary | ICD-10-CM

## 2016-06-22 DIAGNOSIS — R942 Abnormal results of pulmonary function studies: Secondary | ICD-10-CM | POA: Diagnosis not present

## 2016-06-22 DIAGNOSIS — I517 Cardiomegaly: Secondary | ICD-10-CM | POA: Diagnosis not present

## 2016-06-22 DIAGNOSIS — R059 Cough, unspecified: Secondary | ICD-10-CM

## 2016-07-13 ENCOUNTER — Telehealth: Payer: Self-pay | Admitting: *Deleted

## 2016-07-13 NOTE — Telephone Encounter (Signed)
Late entry: Spoke to patient on Thursday 07/12/16 and changed his appointment from Tuesday 07/17/16 at 8am  to Thursday 08/30/16 at 11:40am.  Patient agreed to the change.

## 2016-07-17 ENCOUNTER — Ambulatory Visit: Payer: 59 | Admitting: Cardiology

## 2016-08-22 DIAGNOSIS — I351 Nonrheumatic aortic (valve) insufficiency: Secondary | ICD-10-CM | POA: Diagnosis not present

## 2016-08-22 DIAGNOSIS — N183 Chronic kidney disease, stage 3 (moderate): Secondary | ICD-10-CM | POA: Diagnosis not present

## 2016-08-22 DIAGNOSIS — R768 Other specified abnormal immunological findings in serum: Secondary | ICD-10-CM | POA: Diagnosis not present

## 2016-08-22 DIAGNOSIS — Z9989 Dependence on other enabling machines and devices: Secondary | ICD-10-CM | POA: Diagnosis not present

## 2016-08-22 DIAGNOSIS — I429 Cardiomyopathy, unspecified: Secondary | ICD-10-CM | POA: Diagnosis not present

## 2016-08-22 DIAGNOSIS — G4733 Obstructive sleep apnea (adult) (pediatric): Secondary | ICD-10-CM | POA: Diagnosis not present

## 2016-08-22 DIAGNOSIS — I712 Thoracic aortic aneurysm, without rupture: Secondary | ICD-10-CM | POA: Diagnosis not present

## 2016-08-22 DIAGNOSIS — R310 Gross hematuria: Secondary | ICD-10-CM | POA: Diagnosis not present

## 2016-08-22 DIAGNOSIS — E785 Hyperlipidemia, unspecified: Secondary | ICD-10-CM | POA: Diagnosis not present

## 2016-08-22 DIAGNOSIS — I129 Hypertensive chronic kidney disease with stage 1 through stage 4 chronic kidney disease, or unspecified chronic kidney disease: Secondary | ICD-10-CM | POA: Diagnosis not present

## 2016-08-30 ENCOUNTER — Ambulatory Visit: Payer: 59 | Admitting: Cardiology

## 2016-09-20 ENCOUNTER — Encounter: Payer: Self-pay | Admitting: Internal Medicine

## 2016-09-20 ENCOUNTER — Ambulatory Visit (INDEPENDENT_AMBULATORY_CARE_PROVIDER_SITE_OTHER): Payer: 59 | Admitting: Internal Medicine

## 2016-09-20 VITALS — BP 118/72 | HR 74 | Ht 72.0 in | Wt 217.4 lb

## 2016-09-20 DIAGNOSIS — I359 Nonrheumatic aortic valve disorder, unspecified: Secondary | ICD-10-CM

## 2016-09-20 DIAGNOSIS — I5022 Chronic systolic (congestive) heart failure: Secondary | ICD-10-CM | POA: Diagnosis not present

## 2016-09-20 DIAGNOSIS — I712 Thoracic aortic aneurysm, without rupture, unspecified: Secondary | ICD-10-CM

## 2016-09-20 DIAGNOSIS — Z01812 Encounter for preprocedural laboratory examination: Secondary | ICD-10-CM | POA: Diagnosis not present

## 2016-09-20 DIAGNOSIS — I10 Essential (primary) hypertension: Secondary | ICD-10-CM | POA: Diagnosis not present

## 2016-09-20 NOTE — Progress Notes (Signed)
Cardiology Office Note   Date:  09/20/2016   ID:  Patrick Weaver, DOB February 04, 1945, MRN [id number]  PCP:  Vernie Shanks, MD  Cardiologist:   Dorris Carnes, MD   Follow up of chronic systolic CHF      History of Present Illness: Patrick Weaver is a 71 y.o. male with a history of PVCs  Rx flecanide in the past   LHC in 2014 showed no signif CAD  EF normal   Pt also has history of  aortic insuffic  CT in 07/2015 stable aorta of 42 mm  LVEF on echo was 25 to 30%   This was signif down from previous  AI was mild to moderate.   Interrogation of pacer showed freq PVCs  Flecanide discontinued and pt placed on amiodarone  This was successful in suppressing PVCs  But repeat echo in the fall 2017 showed no improvement in the LVEF   Interrogation of pacemaker showed pacing was infrequent   He went on to have another  L heart catheterization last winter  There was no CAD LVEDP was 21 The pt has been seen by Patrick Weaver  He was swtched from an ARB to [place].    Since I saw him he was seen by Patrick Weaver  Complained of cough  PFTs done  CT done  CT showed thoracic aortic aneurysm more prominent.    The patient says he has been feeling good  No CP  Breathing is OK  No Cough  No signif dizziness No edema     Current Meds  Medication Sig  . acetaminophen (TYLENOL) 325 MG tablet Take 2 tablets (650 mg total) by mouth every 6 (six) hours as needed for mild pain (or Fever >/= 101).  Marland Kitchen amiodarone (PACERONE) 200 MG tablet Take 0.5 tablet (100 mg) by mouth daily  . aspirin EC 81 MG EC tablet Take 1 tablet (81 mg total) by mouth daily.  Marland Kitchen atorvastatin (LIPITOR) 10 MG tablet TAKE 1 TABLET (10 MG TOTAL) BY MOUTH DAILY.  . carvedilol (COREG) 6.25 MG tablet Take 1 tablet (6.25 mg total) by mouth 2 (two) times daily.  Marland Kitchen docusate sodium (COLACE) 100 MG capsule Take 100 mg by mouth daily as needed for mild constipation or moderate constipation.  . gabapentin (NEURONTIN) 300 MG capsule Take 600 mg by mouth 2 (two)  times daily.   . sacubitril-valsartan (ENTRESTO) 24-26 MG Take 1 tablet by mouth 2 (two) times daily.  . tamsulosin (FLOMAX) 0.4 MG CAPS capsule Take 0.4 mg by mouth daily as needed.   . vitamin B-12 (CYANOCOBALAMIN) 1000 MCG tablet Take 1,000 mcg by mouth daily.     Allergies:   Codeine   Past Medical History:  Diagnosis Date  . Bladder stone   . BPH (benign prostatic hyperplasia)   . Bradycardia    treated with pacemaker placement- Dr. Virl Axe MD  . Dyslipidemia    failed niaspan  . HTN (hypertension)   . Pacemaker reprogramming/check   . PACEMAKER-Medtronic 08/31/2008   Qualifier: Diagnosis of  By: Caryl Comes, MD, Remus Blake   . Palpitations   . PVCs (premature ventricular contractions)   . Sleep apnea    USES C-PAP    Past Surgical History:  Procedure Laterality Date  . CYSTOSCOPY WITH LITHOLAPAXY N/A 07/12/2014   Procedure: CYSTOSCOPY WITH LITHOLAPAXY;  Surgeon: Raynelle Bring, MD;  Location: [facility];  Service: Urology;  Laterality: N/A;  . EP IMPLANTABLE DEVICE  2009  .  LEFT HEART CATH AND CORONARY ANGIOGRAPHY N/A 02/15/2016   Procedure: Left Heart Cath and Coronary Angiography;  Surgeon: Burnell Blanks, MD;  Location: [facility];  Service: Cardiovascular;  Laterality: N/A;  . TONSILLECTOMY    . TRANSURETHRAL RESECTION OF PROSTATE N/A 07/12/2014   Procedure: TRANSURETHRAL RESECTION OF THE PROSTATE (TURP);  Surgeon: Raynelle Bring, MD;  Location: [facility];  Service: Urology;  Laterality: N/A;     Social History:  The patient  reports that he has never smoked. He has never used smokeless tobacco. He reports that he does not drink alcohol or use drugs.   Family History:  The patient's family history includes Cancer in his mother; Colon cancer in his maternal grandfather; Congestive Heart Failure in his father; Heart attack in his father; Heart disease in his father; Hypertension in his brother and mother.    ROS:  Please see the history of present  illness. All other systems are reviewed and  Negative to the above problem except as noted.    PHYSICAL EXAM: VS:  BP 118/72   Pulse 74   Ht 6' (1.829 m)   Wt 217 lb 6.4 oz (98.6 kg)   SpO2 97%   BMI 29.48 kg/m   GEN: Well nourished, well developed, in no acute distress  HEENT: normal  Neck: JVP normal  , carotid bruits, or masses Cardiac: RRR; no murmurs, rubs, or gallops,no edema  Respiratory:  clear to auscultation bilaterally, normal work of breathing GI: soft, nontender, nondistended, + BS  No hepatomegaly  MS: no deformity Moving all extremities   Skin: warm and dry, no rash Neuro:  Strength and sensation are intact Psych: euthymic mood, full affect   EKG:  EKG is not ordered today   Lipid Panel    Component Value Date/Time   CHOL 114 (L) 10/21/2015 0941   TRIG 123 10/21/2015 0941   HDL 31 (L) 10/21/2015 0941   CHOLHDL 3.7 10/21/2015 0941   VLDL 25 10/21/2015 0941   LDLCALC 58 10/21/2015 0941   LDLDIRECT 98.6 05/30/2006 0856      Wt Readings from Last 3 Encounters:  09/20/16 217 lb 6.4 oz (98.6 kg)  05/04/16 211 lb 12.8 oz (96.1 kg)  02/15/16 215 lb (97.5 kg)      ASSESSMENT AND PLAN:  1  Chronic systolic heart failure  Nonischemic  Doing good on current regimen  Feeling better actually  Limited echo to reeval LVEF   ? Addition of aldactone  WIll need to review with renal   2.  PVCs  Continue amiodarone  Rare PVC    3  Hx bradycardia  S/p PPM  Infrequ pacing    4  HTN  BP control good  Contiue meds  5  Aortic aneurysm  Aorta measured 4.4 on last CT  Will need f/u     6  Renal insuff  Will get records from renal clinic   Review cr  7  AI  Follow    7  OSA  Need to review recoreds  Has CPAP  Study in 2009  [facility] says he would need another to update machine    WIll premed beofre dental work     F/U based on test results  Current medicines are reviewed at length with the patient today.  The patient does not have concerns regarding  medicines.  Signed, Dorris Carnes, MD  09/20/2016 8:14 AM    [place], [place]  [id number] Phone: [phone]; Fax: [phone]

## 2016-09-20 NOTE — Patient Instructions (Addendum)
Your physician recommends that you continue on your current medications as directed. Please refer to the Current Medication list given to you today.  Your physician has requested that you have an echocardiogram. Echocardiography is a painless test that uses sound waves to create images of your heart. It provides your doctor with information about the size and shape of your heart and how well your heart's chambers and valves are working. This procedure takes approximately one hour. There are no restrictions for this procedure.  Please schedule a chest CT scan to follow up as recommended.  ADDENDUM: BMET ORDERED FOR 11/30/16--PRIOR TO CT SCAN.

## 2016-10-08 ENCOUNTER — Ambulatory Visit (HOSPITAL_COMMUNITY): Payer: 59 | Attending: Internal Medicine

## 2016-10-08 ENCOUNTER — Other Ambulatory Visit: Payer: Self-pay

## 2016-10-08 DIAGNOSIS — I351 Nonrheumatic aortic (valve) insufficiency: Secondary | ICD-10-CM | POA: Diagnosis not present

## 2016-10-08 DIAGNOSIS — E785 Hyperlipidemia, unspecified: Secondary | ICD-10-CM | POA: Diagnosis not present

## 2016-10-08 DIAGNOSIS — I493 Ventricular premature depolarization: Secondary | ICD-10-CM | POA: Diagnosis not present

## 2016-10-08 DIAGNOSIS — E119 Type 2 diabetes mellitus without complications: Secondary | ICD-10-CM | POA: Insufficient documentation

## 2016-10-08 DIAGNOSIS — I77810 Thoracic aortic ectasia: Secondary | ICD-10-CM | POA: Diagnosis not present

## 2016-10-08 DIAGNOSIS — Z950 Presence of cardiac pacemaker: Secondary | ICD-10-CM | POA: Insufficient documentation

## 2016-10-08 DIAGNOSIS — I5022 Chronic systolic (congestive) heart failure: Secondary | ICD-10-CM | POA: Diagnosis not present

## 2016-10-08 DIAGNOSIS — I110 Hypertensive heart disease with heart failure: Secondary | ICD-10-CM | POA: Insufficient documentation

## 2016-10-24 ENCOUNTER — Telehealth: Payer: Self-pay

## 2016-10-24 DIAGNOSIS — R0602 Shortness of breath: Secondary | ICD-10-CM

## 2016-10-24 NOTE — Telephone Encounter (Signed)
Called patient to inform him that it is time for him to have his repeat PFT study. Pt is agreeable to having repeat study but prefers to have the test done in the morning preferable 8:00, 8:30, or 9:00 am and on any day except Wednesday. I told him I would pass the message along to ur schedulers and they would be in contact with him about scheduling. We are both agreeable.

## 2016-10-30 ENCOUNTER — Encounter (HOSPITAL_COMMUNITY): Payer: 59

## 2016-11-01 ENCOUNTER — Ambulatory Visit (HOSPITAL_COMMUNITY)
Admission: RE | Admit: 2016-11-01 | Discharge: 2016-11-01 | Disposition: A | Discharge status: Home / Self Care | Payer: 59 | Source: Ambulatory Visit | Attending: Internal Medicine | Admitting: Internal Medicine

## 2016-11-01 DIAGNOSIS — R0602 Shortness of breath: Secondary | ICD-10-CM | POA: Insufficient documentation

## 2016-11-01 DIAGNOSIS — R942 Abnormal results of pulmonary function studies: Secondary | ICD-10-CM | POA: Diagnosis not present

## 2016-11-01 DIAGNOSIS — Z87891 Personal history of nicotine dependence: Secondary | ICD-10-CM | POA: Diagnosis not present

## 2016-11-01 LAB — PULMONARY FUNCTION TEST
DL/VA % PRED: 81 %
DL/VA: 3.74 ml/min/mmHg/L
DLCO UNC % PRED: 59 %
DLCO UNC: 19.19 ml/min/mmHg
FEF 25-75 POST: 2.54 L/s
FEF 25-75 PRE: 3.2 L/s
FEF2575-%Change-Post: -20 %
FEF2575-%PRED-PRE: 127 %
FEF2575-%Pred-Post: 101 %
FEV1-%Change-Post: -5 %
FEV1-%Pred-Post: 84 %
FEV1-%Pred-Pre: 89 %
FEV1-POST: 2.78 L
FEV1-Pre: 2.93 L
FEV1FVC-%Change-Post: -1 %
FEV1FVC-%PRED-PRE: 112 %
FEV6-%CHANGE-POST: -3 %
FEV6-%PRED-POST: 81 %
FEV6-%Pred-Pre: 84 %
FEV6-POST: 3.39 L
FEV6-Pre: 3.52 L
FEV6FVC-%PRED-POST: 106 %
FEV6FVC-%Pred-Pre: 106 %
FVC-%Change-Post: -3 %
FVC-%PRED-POST: 76 %
FVC-%Pred-Pre: 79 %
FVC-Post: 3.39 L
FVC-Pre: 3.52 L
POST FEV1/FVC RATIO: 82 %
PRE FEV1/FVC RATIO: 83 %
Post FEV6/FVC ratio: 100 %
Pre FEV6/FVC Ratio: 100 %
RV % PRED: 118 %
RV: 2.85 L
TLC % pred: 89 %
TLC: 6.26 L

## 2016-11-01 MED ORDER — ALBUTEROL SULFATE (2.5 MG/3ML) 0.083% IN NEBU
2.5000 mg | INHALATION_SOLUTION | Freq: Once | RESPIRATORY_TRACT | Status: AC
  Administered 2016-11-01: 2.5 mg via RESPIRATORY_TRACT

## 2016-11-05 ENCOUNTER — Other Ambulatory Visit: Payer: Self-pay | Admitting: Internal Medicine

## 2016-11-07 ENCOUNTER — Telehealth: Payer: Self-pay

## 2016-11-07 DIAGNOSIS — R0602 Shortness of breath: Secondary | ICD-10-CM

## 2016-11-07 NOTE — Telephone Encounter (Signed)
Pt is aware and agreeable to results. He is agreeable to repeat study in 3 months.

## 2016-11-19 ENCOUNTER — Other Ambulatory Visit: Payer: Self-pay | Admitting: Internal Medicine

## 2016-11-26 DIAGNOSIS — N401 Enlarged prostate with lower urinary tract symptoms: Secondary | ICD-10-CM | POA: Diagnosis not present

## 2016-11-26 DIAGNOSIS — R350 Frequency of micturition: Secondary | ICD-10-CM | POA: Diagnosis not present

## 2016-11-26 DIAGNOSIS — R310 Gross hematuria: Secondary | ICD-10-CM | POA: Diagnosis not present

## 2016-11-30 ENCOUNTER — Other Ambulatory Visit: Payer: 59 | Admitting: *Deleted

## 2016-11-30 DIAGNOSIS — Z01812 Encounter for preprocedural laboratory examination: Secondary | ICD-10-CM

## 2016-11-30 LAB — BASIC METABOLIC PANEL
BUN/Creatinine Ratio: 11 (ref 10–24)
BUN: 15 mg/dL (ref 8–27)
CALCIUM: 9.4 mg/dL (ref 8.6–10.2)
CHLORIDE: 105 mmol/L (ref 96–106)
CO2: 22 mmol/L (ref 20–29)
Creatinine, Ser: 1.37 mg/dL — ABNORMAL HIGH (ref 0.76–1.27)
GFR, EST AFRICAN AMERICAN: 61 mL/min/{1.73_m2} (ref >59)
GFR, EST NON AFRICAN AMERICAN: 53 mL/min/{1.73_m2} — AB (ref >59)
Glucose: 100 mg/dL — ABNORMAL HIGH (ref 65–99)
POTASSIUM: 4.1 mmol/L (ref 3.5–5.2)
Sodium: 141 mmol/L (ref 134–144)

## 2016-12-04 ENCOUNTER — Ambulatory Visit (INDEPENDENT_AMBULATORY_CARE_PROVIDER_SITE_OTHER)
Admission: RE | Admit: 2016-12-04 | Discharge: 2016-12-04 | Disposition: A | Discharge status: Home / Self Care | Payer: 59 | Source: Ambulatory Visit | Attending: Internal Medicine | Admitting: Internal Medicine

## 2016-12-04 DIAGNOSIS — I712 Thoracic aortic aneurysm, without rupture, unspecified: Secondary | ICD-10-CM

## 2016-12-04 DIAGNOSIS — I5022 Chronic systolic (congestive) heart failure: Secondary | ICD-10-CM | POA: Diagnosis not present

## 2016-12-04 MED ORDER — IOPAMIDOL (ISOVUE-370) INJECTION 76%
100.0000 mL | Freq: Once | INTRAVENOUS | Status: AC | PRN
Start: 2016-12-04 — End: 2016-12-04
  Administered 2016-12-04: 100 mL via INTRAVENOUS

## 2016-12-06 ENCOUNTER — Telehealth: Payer: Self-pay | Admitting: *Deleted

## 2016-12-06 DIAGNOSIS — I712 Thoracic aortic aneurysm, without rupture, unspecified: Secondary | ICD-10-CM

## 2016-12-06 NOTE — Telephone Encounter (Signed)
Pt has been notified of CT-A results and findings by phone with verbal understanding. Pt advised of recommendation to repeat CT-A in 1 yr. Pt is agreeable to plan of care. New order has been placed.

## 2016-12-06 NOTE — Telephone Encounter (Signed)
-----   Message from [place], MD sent at 12/05/2016  4:24 PM EST ----- Stable dilation of aorta   Will need periodic follow up with CT scan  F/U in 1 eyear

## 2016-12-14 ENCOUNTER — Other Ambulatory Visit: Payer: Self-pay | Admitting: Internal Medicine

## 2017-01-03 DIAGNOSIS — I129 Hypertensive chronic kidney disease with stage 1 through stage 4 chronic kidney disease, or unspecified chronic kidney disease: Secondary | ICD-10-CM | POA: Diagnosis not present

## 2017-01-03 DIAGNOSIS — G4733 Obstructive sleep apnea (adult) (pediatric): Secondary | ICD-10-CM | POA: Diagnosis not present

## 2017-01-03 DIAGNOSIS — N183 Chronic kidney disease, stage 3 (moderate): Secondary | ICD-10-CM | POA: Diagnosis not present

## 2017-01-03 DIAGNOSIS — I429 Cardiomyopathy, unspecified: Secondary | ICD-10-CM | POA: Diagnosis not present

## 2017-01-03 DIAGNOSIS — R768 Other specified abnormal immunological findings in serum: Secondary | ICD-10-CM | POA: Diagnosis not present

## 2017-01-03 DIAGNOSIS — I351 Nonrheumatic aortic (valve) insufficiency: Secondary | ICD-10-CM | POA: Diagnosis not present

## 2017-01-03 DIAGNOSIS — R310 Gross hematuria: Secondary | ICD-10-CM | POA: Diagnosis not present

## 2017-01-03 DIAGNOSIS — I712 Thoracic aortic aneurysm, without rupture: Secondary | ICD-10-CM | POA: Diagnosis not present

## 2017-01-03 DIAGNOSIS — Z9989 Dependence on other enabling machines and devices: Secondary | ICD-10-CM | POA: Diagnosis not present

## 2017-01-03 DIAGNOSIS — E785 Hyperlipidemia, unspecified: Secondary | ICD-10-CM | POA: Diagnosis not present

## 2017-01-04 DIAGNOSIS — N401 Enlarged prostate with lower urinary tract symptoms: Secondary | ICD-10-CM | POA: Diagnosis not present

## 2017-01-04 DIAGNOSIS — R351 Nocturia: Secondary | ICD-10-CM | POA: Diagnosis not present

## 2017-01-04 DIAGNOSIS — R310 Gross hematuria: Secondary | ICD-10-CM | POA: Diagnosis not present

## 2017-01-24 ENCOUNTER — Encounter: Payer: Self-pay | Admitting: Podiatry

## 2017-01-24 ENCOUNTER — Ambulatory Visit (INDEPENDENT_AMBULATORY_CARE_PROVIDER_SITE_OTHER): Payer: 59 | Admitting: Podiatry

## 2017-01-24 VITALS — BP 125/74 | HR 75 | Ht 72.0 in | Wt 210.0 lb

## 2017-01-24 DIAGNOSIS — B070 Plantar wart: Secondary | ICD-10-CM | POA: Diagnosis not present

## 2017-01-24 DIAGNOSIS — L989 Disorder of the skin and subcutaneous tissue, unspecified: Secondary | ICD-10-CM | POA: Diagnosis not present

## 2017-01-24 NOTE — Progress Notes (Signed)
Subjective:    Patient ID: Patrick Weaver, male    DOB: 1945/09/01, 72 y.o.   MRN: [id number]  HPI  Chief Complaint  Patient presents with  . Plantar Warts    NP plantar wart, bottom of foot   72 y.o. male presents with the above complaint. Reports a lesion to the bottom of the left foot. Present for several weeks. Denies prior treatment.  Past Medical History:  Diagnosis Date  . Bladder stone   . BPH (benign prostatic hyperplasia)   . Bradycardia    treated with pacemaker placement- Dr. Virl Axe MD  . Dyslipidemia    failed niaspan  . HTN (hypertension)   . Pacemaker reprogramming/check   . PACEMAKER-Medtronic 08/31/2008   Qualifier: Diagnosis of  By: Caryl Comes, MD, Remus Blake   . Palpitations   . PVCs (premature ventricular contractions)   . Sleep apnea    USES C-PAP   Past Surgical History:  Procedure Laterality Date  . CYSTOSCOPY WITH LITHOLAPAXY N/A 07/12/2014   Procedure: CYSTOSCOPY WITH LITHOLAPAXY;  Surgeon: Raynelle Bring, MD;  Location: [facility];  Service: Urology;  Laterality: N/A;  . EP IMPLANTABLE DEVICE  2009  . LEFT HEART CATH AND CORONARY ANGIOGRAPHY N/A 02/15/2016   Procedure: Left Heart Cath and Coronary Angiography;  Surgeon: Burnell Blanks, MD;  Location: [facility];  Service: Cardiovascular;  Laterality: N/A;  . TONSILLECTOMY    . TRANSURETHRAL RESECTION OF PROSTATE N/A 07/12/2014   Procedure: TRANSURETHRAL RESECTION OF THE PROSTATE (TURP);  Surgeon: Raynelle Bring, MD;  Location: [facility];  Service: Urology;  Laterality: N/A;    Current Outpatient Medications:  .  acetaminophen (TYLENOL) 325 MG tablet, Take 2 tablets (650 mg total) by mouth every 6 (six) hours as needed for mild pain (or Fever >/= 101)., Disp: 30 tablet, Rfl: 0 .  amiodarone (PACERONE) 200 MG tablet, Take 0.5 tablets (100 mg total) daily by mouth., Disp: 90 tablet, Rfl: 3 .  aspirin EC 81 MG EC tablet, Take 1 tablet (81 mg total) by mouth daily., Disp: , Rfl:  .   atorvastatin (LIPITOR) 10 MG tablet, TAKE 1 TABLET (10 MG TOTAL) BY MOUTH DAILY., Disp: 90 tablet, Rfl: 2 .  carvedilol (COREG) 6.25 MG tablet, Take 1 tablet (6.25 mg total) by mouth 2 (two) times daily. (Patient not taking: Reported on 01/24/2017), Disp: 180 tablet, Rfl: 3 .  carvedilol (COREG) 6.25 MG tablet, TAKE 1 TABLET (6.25 MG TOTAL) BY MOUTH 2 (TWO) TIMES DAILY., Disp: 180 tablet, Rfl: 2 .  docusate sodium (COLACE) 100 MG capsule, Take 100 mg by mouth daily as needed for mild constipation or moderate constipation., Disp: , Rfl:  .  gabapentin (NEURONTIN) 300 MG capsule, Take 600 mg by mouth 2 (two) times daily. , Disp: , Rfl:  .  sacubitril-valsartan (ENTRESTO) 24-26 MG, Take 1 tablet by mouth 2 (two) times daily., Disp: 180 tablet, Rfl: 3 .  tamsulosin (FLOMAX) 0.4 MG CAPS capsule, Take 0.4 mg by mouth daily as needed. , Disp: , Rfl:  .  vitamin B-12 (CYANOCOBALAMIN) 1000 MCG tablet, Take 1,000 mcg by mouth daily., Disp: , Rfl:   Allergies  Allergen Reactions  . Codeine Nausea Only      Review of Systems  Respiratory: Positive for chest tightness and shortness of breath.   Neurological: Positive for light-headedness.  All other systems reviewed and are negative.      Objective:   Physical Exam Vitals:   01/24/17 0926  BP:  125/74  Pulse: 75   General AA&O x3. Normal mood and affect.  Vascular Dorsalis pedis and posterior tibial pulses  present 2+ bilaterally  Capillary refill normal to all digits. Pedal hair growth normal.  Neurologic Epicritic sensation grossly present.  Dermatologic Punctate lesion non-weightbearing area of L forefoot. Pain with lateral compression, petechial bleeding upon debridement. Interspaces clear of maceration. Nails well groomed and normal in appearance.  Orthopedic: MMT 5/5 in dorsiflexion, plantarflexion, inversion, and eversion. Normal joint ROM without pain or crepitus.     Assessment & Plan:   Verruca, L forefoot -Educated on the  etiology. -Lesion destroyed as below. -Educated on post-op care.  Procedure: Destruction of Lesion Location: L forefoot Anesthesia: none Instrumentation: 15 blade. Technique: Debridement of lesion to petechial bleeding. Aperture pad applied around lesion. Small amount of salinocaine applied to the base of the lesion. Dressing: Dry, sterile, compression dressing. Disposition: Patient tolerated procedure well. Advised to leave dressing on for 6-8 hours. Thereafter patient to wash the area with soap and water and applied band-aid. Off-loading pads dispensed. Patient to return in 2 weeks for follow-up.

## 2017-02-04 ENCOUNTER — Ambulatory Visit (HOSPITAL_COMMUNITY)
Admission: RE | Admit: 2017-02-04 | Discharge: 2017-02-04 | Disposition: A | Discharge status: Home / Self Care | Payer: 59 | Source: Ambulatory Visit | Attending: Internal Medicine | Admitting: Internal Medicine

## 2017-02-04 DIAGNOSIS — R0602 Shortness of breath: Secondary | ICD-10-CM | POA: Insufficient documentation

## 2017-02-04 LAB — PULMONARY FUNCTION TEST
DL/VA % pred: 79 %
DL/VA: 3.73 ml/min/mmHg/L
DLCO UNC: 22.25 ml/min/mmHg
DLCO unc % pred: 63 %
FEF 25-75 Pre: 2.88 L/sec
FEF2575-%Pred-Pre: 106 %
FEV1-%Pred-Pre: 84 %
FEV1-Pre: 3.01 L
FEV1FVC-%PRED-PRE: 107 %
FEV6-%Pred-Pre: 82 %
FEV6-PRE: 3.77 L
FEV6FVC-%Pred-Pre: 105 %
FVC-%Pred-Pre: 78 %
FVC-Pre: 3.77 L
PRE FEV1/FVC RATIO: 80 %
PRE FEV6/FVC RATIO: 100 %
RV % pred: 97 %
RV: 2.49 L
TLC % PRED: 87 %
TLC: 6.51 L

## 2017-02-08 ENCOUNTER — Ambulatory Visit (INDEPENDENT_AMBULATORY_CARE_PROVIDER_SITE_OTHER): Payer: 59 | Admitting: Podiatry

## 2017-02-08 ENCOUNTER — Encounter: Payer: Self-pay | Admitting: Podiatry

## 2017-02-08 DIAGNOSIS — B070 Plantar wart: Secondary | ICD-10-CM

## 2017-02-19 NOTE — Progress Notes (Signed)
  Subjective:  Patient ID: Patrick Weaver, male    DOB: September 02, 1945,  MRN: [id number]  Chief Complaint  Patient presents with  . Plantar Warts    Follow up plantar forefoot left   "Its doing fine"   72 y.o. male returns for the above complaint. States the wart is looking better.  Objective:  There were no vitals filed for this visit. General AA&O x3. Normal mood and affect.  Vascular Pedal pulses palpable.  Neurologic Epicritic sensation grossly intact.  Dermatologic Improved verruca L forefoot  Orthopedic: No pain to palpation either foot.   Assessment & Plan:  Patient was evaluated and treated and all questions answered.  Verruca Plantaris -Improving. Offloading pad dispensed -F/u in 4 weeks for recheck.  Return in about 4 weeks (around 03/08/2017) for Wart f/u.

## 2017-02-20 ENCOUNTER — Ambulatory Visit (INDEPENDENT_AMBULATORY_CARE_PROVIDER_SITE_OTHER): Payer: 59 | Admitting: Internal Medicine

## 2017-02-20 VITALS — BP 128/80 | HR 75 | Ht 72.0 in | Wt 217.0 lb

## 2017-02-20 DIAGNOSIS — Z950 Presence of cardiac pacemaker: Secondary | ICD-10-CM | POA: Diagnosis not present

## 2017-02-20 DIAGNOSIS — Z79899 Other long term (current) drug therapy: Secondary | ICD-10-CM

## 2017-02-20 DIAGNOSIS — I5022 Chronic systolic (congestive) heart failure: Secondary | ICD-10-CM | POA: Diagnosis not present

## 2017-02-20 DIAGNOSIS — R001 Bradycardia, unspecified: Secondary | ICD-10-CM | POA: Diagnosis not present

## 2017-02-20 LAB — CUP PACEART INCLINIC DEVICE CHECK
Battery Impedance: 1692 Ohm
Battery Remaining Longevity: 37 mo
Brady Statistic AP VP Percent: 7 %
Brady Statistic AS VP Percent: 0 %
Date Time Interrogation Session: 20190220144404
Implantable Lead Implant Date: 20090316
Implantable Lead Location: 753859
Implantable Lead Location: 753860
Implantable Lead Model: 5076
Implantable Pulse Generator Implant Date: 20090316
Lead Channel Pacing Threshold Amplitude: 0.5 V
Lead Channel Pacing Threshold Pulse Width: 0.4 ms
Lead Channel Setting Pacing Amplitude: 2.5 V
MDC IDC LEAD IMPLANT DT: 20090316
MDC IDC MSMT BATTERY VOLTAGE: 2.77 V
MDC IDC MSMT LEADCHNL RA IMPEDANCE VALUE: 377 Ohm
MDC IDC MSMT LEADCHNL RA SENSING INTR AMPL: 1.4 mV
MDC IDC MSMT LEADCHNL RV IMPEDANCE VALUE: 505 Ohm
MDC IDC MSMT LEADCHNL RV PACING THRESHOLD AMPLITUDE: 0.75 V
MDC IDC MSMT LEADCHNL RV PACING THRESHOLD PULSEWIDTH: 0.4 ms
MDC IDC MSMT LEADCHNL RV SENSING INTR AMPL: 11.2 mV
MDC IDC SET LEADCHNL RA PACING AMPLITUDE: 2 V
MDC IDC SET LEADCHNL RV PACING PULSEWIDTH: 0.4 ms
MDC IDC SET LEADCHNL RV SENSING SENSITIVITY: 5.6 mV
MDC IDC STAT BRADY AP VS PERCENT: 91 %
MDC IDC STAT BRADY AS VS PERCENT: 2 %

## 2017-02-20 LAB — COMPREHENSIVE METABOLIC PANEL
ALBUMIN: 4.6 g/dL (ref 3.6–4.8)
ALT: 16 IU/L (ref 0–44)
AST: 17 IU/L (ref 0–40)
Albumin/Globulin Ratio: 2.3 — ABNORMAL HIGH (ref 1.2–2.2)
Alkaline Phosphatase: 92 IU/L (ref 39–117)
BILIRUBIN TOTAL: 0.5 mg/dL (ref 0.0–1.2)
BUN / CREAT RATIO: 11 (ref 10–24)
BUN: 13 mg/dL (ref 8–27)
CALCIUM: 9.3 mg/dL (ref 8.6–10.2)
CHLORIDE: 104 mmol/L (ref 96–106)
CO2: 23 mmol/L (ref 20–29)
CREATININE: 1.22 mg/dL (ref 0.76–1.27)
GFR, EST AFRICAN AMERICAN: 69 mL/min/{1.73_m2} (ref >59)
GFR, EST NON AFRICAN AMERICAN: 60 mL/min/{1.73_m2} (ref >59)
GLUCOSE: 86 mg/dL (ref 65–99)
Globulin, Total: 2 g/dL (ref 1.5–4.5)
Potassium: 4 mmol/L (ref 3.5–5.2)
Sodium: 141 mmol/L (ref 134–144)
TOTAL PROTEIN: 6.6 g/dL (ref 6.0–8.5)

## 2017-02-20 LAB — TSH: TSH: 3.63 u[IU]/mL (ref 0.450–4.500)

## 2017-02-20 MED ORDER — SPIRONOLACTONE 25 MG PO TABS: 12.5000 mg | ORAL_TABLET | Freq: Every day | ORAL | 3 refills | Status: DC

## 2017-02-20 NOTE — Patient Instructions (Addendum)
Medication Instructions:  Aldactone 12.5mg , one half tablet, every day  Labwork: Your physician recommends that have labwork today: CMET, TSH  You will need a repeat BMP Feb 27th and another repeat BMP March 13  Testing/Procedures: None ordered.  Follow-Up: Your physician recommends that you schedule a follow-up appointment in: Follow up with [place] or [place] in 6 weeks   Remote monitoring is used to monitor your Pacemaker of ICD from home. This monitoring reduces the number of office visits required to check your device to one time per year. It allows [place] to keep an eye on the functioning of your device to ensure it is working properly. You are scheduled for a device check from home on 5/22. You may send your transmission at any time that day. If you have a wireless device, the transmission will be sent automatically. After your physician reviews your transmission, you will receive a postcard with your next transmission date.   Any Other Special Instructions Will Be Listed Below (If Applicable).     If you need a refill on your cardiac medications before your next appointment, please call your pharmacy.

## 2017-02-20 NOTE — Progress Notes (Signed)
Patient Care Team: Vernie Shanks, MD as PCP - General (Family Medicine)   HPI  Patrick Weaver is a 72 y.o. male seen in followup for pacer implantation for bradycardia. He has also had significant problems with PVCs. This had been treated with flecanide with much improvement. As noted below, there was significant interval decrease in left ventricular function that prompted the discontinuation of flecainide and the initiation of amiodarone. PVCs were obliterated but there is no interval improvement in LVEF. Hence he underwent catheterization.      DATE TEST    8/15 Echo   EF 65 %   7/17  Echo   EF 25 %   12/17 Echo EF 25% AI  mild   2/18 Cath  No obstructive CAD  8/18 Echo EF 20-25%    12/18 CT Aorta  4.5 cm Aneruysm    Date Cr TSH LFTs PFTs  10/17   3.19 29    5/18  1.37 2.47 18 DLCO stable   He has shortness of breath with exertion.  Is accompanied by some chest discomfort this is nonradiating.  He does not have peripheral edema or nocturnal dyspnea.  Past Medical History:  Diagnosis Date  . Bladder stone   . BPH (benign prostatic hyperplasia)   . Bradycardia    treated with pacemaker placement- Dr. Virl Axe MD  . Dyslipidemia    failed niaspan  . HTN (hypertension)   . Pacemaker reprogramming/check   . PACEMAKER-Medtronic 08/31/2008   Qualifier: Diagnosis of  By: Caryl Comes, MD, Remus Blake   . Palpitations   . PVCs (premature ventricular contractions)   . Sleep apnea    USES C-PAP    Past Surgical History:  Procedure Laterality Date  . CYSTOSCOPY WITH LITHOLAPAXY N/A 07/12/2014   Procedure: CYSTOSCOPY WITH LITHOLAPAXY;  Surgeon: Raynelle Bring, MD;  Location: [facility];  Service: Urology;  Laterality: N/A;  . EP IMPLANTABLE DEVICE  2009  . LEFT HEART CATH AND CORONARY ANGIOGRAPHY N/A 02/15/2016   Procedure: Left Heart Cath and Coronary Angiography;  Surgeon: Burnell Blanks, MD;  Location: [facility];  Service: Cardiovascular;   Laterality: N/A;  . TONSILLECTOMY    . TRANSURETHRAL RESECTION OF PROSTATE N/A 07/12/2014   Procedure: TRANSURETHRAL RESECTION OF THE PROSTATE (TURP);  Surgeon: Raynelle Bring, MD;  Location: [facility];  Service: Urology;  Laterality: N/A;    Current Outpatient Medications  Medication Sig Dispense Refill  . acetaminophen (TYLENOL) 325 MG tablet Take 2 tablets (650 mg total) by mouth every 6 (six) hours as needed for mild pain (or Fever >/= 101). 30 tablet 0  . amiodarone (PACERONE) 200 MG tablet Take 0.5 tablets (100 mg total) daily by mouth. 90 tablet 3  . atorvastatin (LIPITOR) 10 MG tablet TAKE 1 TABLET (10 MG TOTAL) BY MOUTH DAILY. 90 tablet 2  . carvedilol (COREG) 6.25 MG tablet Take 1 tablet (6.25 mg total) by mouth 2 (two) times daily. 180 tablet 3  . carvedilol (COREG) 6.25 MG tablet TAKE 1 TABLET (6.25 MG TOTAL) BY MOUTH 2 (TWO) TIMES DAILY. 180 tablet 2  . docusate sodium (COLACE) 100 MG capsule Take 100 mg by mouth daily as needed for mild constipation or moderate constipation.    . gabapentin (NEURONTIN) 300 MG capsule Take 600 mg by mouth 2 (two) times daily.     . sacubitril-valsartan (ENTRESTO) 24-26 MG Take 1 tablet by mouth 2 (two) times daily. 180 tablet 3  . tamsulosin (  FLOMAX) 0.4 MG CAPS capsule Take 0.4 mg by mouth daily as needed.     . vitamin B-12 (CYANOCOBALAMIN) 1000 MCG tablet Take 1,000 mcg by mouth daily.     No current facility-administered medications for this visit.     Allergies  Allergen Reactions  . Codeine Nausea Only    Review of Systems negative except from HPI and PMH  Physical Exam BP 128/80   Pulse 75   Ht 6' (1.829 m)   Wt 217 lb (98.4 kg)   BMI 29.43 kg/m  Well developed and nourished in no acute distress HENT normal Neck supple with JVP-flat Clear Device pocket well healed; without hematoma or erythema.  There is no tethering Regular rate and rhythm, no murmurs or gallops Abd-soft with active BS No Clubbing cyanosis edema Skin-warm  and dry A & Oriented  Grossly normal sensory and motor function    ECG Atrial pacing 75 30/12/42 nsst   Assessment and  Plan  Sinus node dysfunction  Pacemaker-Medtronic The patient's device was interrogated.  The information was reviewed. No changes were made in the programming.    PVCs-amiodarone  HTN  Renal insufficiency grade 3  NICM  First-degree AV block  Congestive heart failure-chronic-systolic-class III  Aortic root dilatation with central AI  PVCs scant  Tolerating amiodarone.  We will recheck laboratories today.  Euvolemic.  From an arrhythmia point of view and a device point of view I do not think there is anything that we could do to improve cardiac performance.  His heart rate excursion is reasonable I think.  His first-degree AV block may be contributing; however, the alternative would be ventricular pacing or device upgrade.  Related to his cardiomyopathy, he may benefit from Aldactone.  I have reviewed this with Dr. Ysidro Evert today.  We will begin him.  He has mild renal insufficiency but should not be preclusive.  We will check baseline labs today and then again in 1 week 3 weeks  Aortic aneurysm is stable,  Reviewed and will need followup in a year  More than 50% of 40 min was spent in counseling related to the above

## 2017-02-27 ENCOUNTER — Other Ambulatory Visit: Payer: 59 | Admitting: *Deleted

## 2017-02-27 DIAGNOSIS — I5022 Chronic systolic (congestive) heart failure: Secondary | ICD-10-CM

## 2017-02-27 DIAGNOSIS — Z79899 Other long term (current) drug therapy: Secondary | ICD-10-CM

## 2017-02-27 LAB — BASIC METABOLIC PANEL
BUN/Creatinine Ratio: 14 (ref 10–24)
BUN: 17 mg/dL (ref 8–27)
CO2: 22 mmol/L (ref 20–29)
Calcium: 9 mg/dL (ref 8.6–10.2)
Chloride: 105 mmol/L (ref 96–106)
Creatinine, Ser: 1.19 mg/dL (ref 0.76–1.27)
GFR, EST AFRICAN AMERICAN: 72 mL/min/{1.73_m2} (ref >59)
GFR, EST NON AFRICAN AMERICAN: 62 mL/min/{1.73_m2} (ref >59)
Glucose: 138 mg/dL — ABNORMAL HIGH (ref 65–99)
Potassium: 4.4 mmol/L (ref 3.5–5.2)
SODIUM: 142 mmol/L (ref 134–144)

## 2017-03-08 ENCOUNTER — Ambulatory Visit: Payer: Medicare Other | Admitting: Podiatry

## 2017-03-13 ENCOUNTER — Other Ambulatory Visit: Payer: Self-pay | Admitting: Internal Medicine

## 2017-03-13 ENCOUNTER — Other Ambulatory Visit: Payer: 59

## 2017-03-13 LAB — BASIC METABOLIC PANEL
BUN / CREAT RATIO: 7 — AB (ref 10–24)
BUN: 10 mg/dL (ref 8–27)
CHLORIDE: 102 mmol/L (ref 96–106)
CO2: 23 mmol/L (ref 20–29)
Calcium: 9.1 mg/dL (ref 8.6–10.2)
Creatinine, Ser: 1.38 mg/dL — ABNORMAL HIGH (ref 0.76–1.27)
GFR calc Af Amer: 60 mL/min/{1.73_m2} (ref >59)
GFR calc non Af Amer: 52 mL/min/{1.73_m2} — ABNORMAL LOW (ref >59)
GLUCOSE: 146 mg/dL — AB (ref 65–99)
Potassium: 3.9 mmol/L (ref 3.5–5.2)
SODIUM: 141 mmol/L (ref 134–144)

## 2017-03-26 NOTE — Progress Notes (Signed)
Electrophysiology Office Note Date: 04/04/2017  ID:  Patrick Weaver, DOB 1945-01-16, MRN [id number]  PCP: Vernie Shanks, MD Primary Cardiologist: [name] Electrophysiologist: Caryl Comes  CC: Pacemaker follow-up  Patrick Weaver is a 72 y.o. male seen today for Dr Caryl Comes.  He presents today for routine electrophysiology followup.  Since last being seen in our clinic, the patient reports doing relatively well.  Spironolactone was added after being seen by Dr [name]. He has not noted change in symptoms.  He denies chest pain, palpitations, dyspnea (above baseline), PND, orthopnea, nausea, vomiting, dizziness, syncope, edema, weight gain, or early satiety.  Device History: MDT dual chamber PPM implanted 2009 for SSS   Past Medical History:  Diagnosis Date  . Bladder stone   . BPH (benign prostatic hyperplasia)   . Bradycardia    a.s/p MDT dual chamber pacemaker  . Dyslipidemia    failed niaspan  . HTN (hypertension)   . NICM (nonischemic cardiomyopathy) ([place])   . PVCs (premature ventricular contractions)   . Sleep apnea    USES C-PAP   Past Surgical History:  Procedure Laterality Date  . CYSTOSCOPY WITH LITHOLAPAXY N/A 07/12/2014   Procedure: CYSTOSCOPY WITH LITHOLAPAXY;  Surgeon: Raynelle Bring, MD;  Location: [facility];  Service: Urology;  Laterality: N/A;  . EP IMPLANTABLE DEVICE  2009  . LEFT HEART CATH AND CORONARY ANGIOGRAPHY N/A 02/15/2016   Procedure: Left Heart Cath and Coronary Angiography;  Surgeon: Burnell Blanks, MD;  Location: [facility];  Service: Cardiovascular;  Laterality: N/A;  . TONSILLECTOMY    . TRANSURETHRAL RESECTION OF PROSTATE N/A 07/12/2014   Procedure: TRANSURETHRAL RESECTION OF THE PROSTATE (TURP);  Surgeon: Raynelle Bring, MD;  Location: [facility];  Service: Urology;  Laterality: N/A;    Current Outpatient Medications  Medication Sig Dispense Refill  . acetaminophen (TYLENOL) 325 MG tablet Take 2 tablets (650 mg total) by mouth every 6 (six) hours  as needed for mild pain (or Fever >/= 101). 30 tablet 0  . amiodarone (PACERONE) 200 MG tablet Take 0.5 tablets (100 mg total) daily by mouth. 90 tablet 3  . atorvastatin (LIPITOR) 10 MG tablet TAKE 1 TABLET (10 MG TOTAL) BY MOUTH DAILY. 90 tablet 2  . carvedilol (COREG) 6.25 MG tablet Take 1 tablet (6.25 mg total) by mouth 2 (two) times daily. 180 tablet 3  . carvedilol (COREG) 6.25 MG tablet TAKE 1 TABLET (6.25 MG TOTAL) BY MOUTH 2 (TWO) TIMES DAILY. 180 tablet 2  . docusate sodium (COLACE) 100 MG capsule Take 100 mg by mouth daily as needed for mild constipation or moderate constipation.    . gabapentin (NEURONTIN) 300 MG capsule Take 600 mg by mouth 2 (two) times daily.     . sacubitril-valsartan (ENTRESTO) 24-26 MG Take 1 tablet by mouth 2 (two) times daily. 180 tablet 3  . spironolactone (ALDACTONE) 25 MG tablet Take 0.5 tablets (12.5 mg total) by mouth daily. 45 tablet 3  . tamsulosin (FLOMAX) 0.4 MG CAPS capsule Take 0.4 mg by mouth daily as needed.     . vitamin B-12 (CYANOCOBALAMIN) 1000 MCG tablet Take 1,000 mcg by mouth daily.     No current facility-administered medications for this visit.     Allergies:   Codeine   Social History: Social History   Socioeconomic History  . Marital status: Married    Spouse name: Not on file  . Number of children: 3  . Years of education: Not on file  . Highest education  level: Not on file  Occupational History  . Not on file  Social Needs  . Financial resource strain: Not on file  . Food insecurity:    Worry: Not on file    Inability: Not on file  . Transportation needs:    Medical: Not on file    Non-medical: Not on file  Tobacco Use  . Smoking status: Never Smoker  . Smokeless tobacco: Never Used  Substance and Sexual Activity  . Alcohol use: No  . Drug use: No  . Sexual activity: Not on file  Lifestyle  . Physical activity:    Days per week: Not on file    Minutes per session: Not on file  . Stress: Not on file    Relationships  . Social connections:    Talks on phone: Not on file    Gets together: Not on file    Attends religious service: Not on file    Active member of club or organization: Not on file    Attends meetings of clubs or organizations: Not on file    Relationship status: Not on file  . Intimate partner violence:    Fear of current or ex partner: Not on file    Emotionally abused: Not on file    Physically abused: Not on file    Forced sexual activity: Not on file  Other Topics Concern  . Not on file  Social History Narrative   Married, 3 daughters. 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 Drinks 3 caffeinated beverages/day.     Family History: Family History  Problem Relation Age of Onset  . Hypertension Mother   . Cancer Mother   . Congestive Heart Failure Father   . Heart disease Father   . Heart attack Father   . Colon cancer Maternal Grandfather   . Hypertension Brother   . Stomach cancer Neg Hx   . Esophageal cancer Neg Hx      Review of Systems: All other systems reviewed and are otherwise negative except as noted above.   Physical Exam: VS:  BP 122/82   Pulse 78   Ht 6' (1.829 m)   Wt 213 lb (96.6 kg)   SpO2 98%   BMI 28.89 kg/m  , BMI Body mass index is 28.89 kg/m.  GEN- The patient is well appearing, alert and oriented x 3 today.   HEENT: normocephalic, atraumatic; sclera clear, conjunctiva pink; hearing intact; oropharynx clear; neck supple  Lungs- Clear to ausculation bilaterally, normal work of breathing.  No wheezes, rales, rhonchi Heart- Regular rate and rhythm  GI- soft, non-tender, non-distended, bowel sounds present  Extremities- no clubbing, cyanosis, or edema  MS- no significant deformity or atrophy Skin- warm and dry, no rash or lesion; PPM pocket well healed Psych- euthymic mood, full affect Neuro- strength and sensation are intact  PPM Interrogation- reviewed in detail today,  See PACEART report  EKG:  EKG is not ordered  today.  Recent Labs: 04/17/2016: Hemoglobin 15.0; Platelets 130 02/20/2017: ALT 16; TSH 3.630 04/03/2017: BUN 12; Creatinine, Ser 1.32; Potassium 3.9; Sodium 143   Wt Readings from Last 3 Encounters:  04/03/17 213 lb (96.6 kg)  02/20/17 217 lb (98.4 kg)  01/24/17 210 lb (95.3 kg)     Other studies Reviewed: Additional studies/ records that were reviewed today include: Dr Caryl Comes and Dr [name]' office notes  Assessment and Plan:  1.  Symptomatic bradycardia Normal PPM function See Pace Art report No changes today  2.  PVC's Continue amiodarone Short NSVT episodes noted on device interrogation today  Labs and PFT's reviewed  3.  NICM Euvolemic on exam Continue current therapy Will defer to Dr Caryl Comes consideration of ICD upgrade  4.  HTN Stable No change required today   Current medicines are reviewed at length with the patient today.   The patient does not have concerns regarding his medicines.  The following changes were made today:  none  Labs/ tests ordered today include: BMET Orders Placed This Encounter  Procedures  . Basic metabolic panel     Disposition:   Follow up with Dr Caryl Comes 6 months, [facility]      Signed, Chanetta Marshall, NP 04/04/2017 8:07 AM  [address] [place] [address] [phone] (office) [phone] (fax)

## 2017-04-03 ENCOUNTER — Ambulatory Visit (INDEPENDENT_AMBULATORY_CARE_PROVIDER_SITE_OTHER): Payer: 59 | Admitting: Nurse Practitioner

## 2017-04-03 ENCOUNTER — Encounter: Payer: Self-pay | Admitting: Nurse Practitioner

## 2017-04-03 VITALS — BP 122/82 | HR 78 | Ht 72.0 in | Wt 213.0 lb

## 2017-04-03 DIAGNOSIS — I495 Sick sinus syndrome: Secondary | ICD-10-CM | POA: Diagnosis not present

## 2017-04-03 DIAGNOSIS — I10 Essential (primary) hypertension: Secondary | ICD-10-CM | POA: Diagnosis not present

## 2017-04-03 DIAGNOSIS — I493 Ventricular premature depolarization: Secondary | ICD-10-CM

## 2017-04-03 DIAGNOSIS — I428 Other cardiomyopathies: Secondary | ICD-10-CM | POA: Diagnosis not present

## 2017-04-03 LAB — BASIC METABOLIC PANEL
BUN/Creatinine Ratio: 9 — ABNORMAL LOW (ref 10–24)
BUN: 12 mg/dL (ref 8–27)
CHLORIDE: 104 mmol/L (ref 96–106)
CO2: 22 mmol/L (ref 20–29)
CREATININE: 1.32 mg/dL — AB (ref 0.76–1.27)
Calcium: 9.4 mg/dL (ref 8.6–10.2)
GFR calc Af Amer: 63 mL/min/{1.73_m2} (ref >59)
GFR calc non Af Amer: 55 mL/min/{1.73_m2} — ABNORMAL LOW (ref >59)
GLUCOSE: 111 mg/dL — AB (ref 65–99)
POTASSIUM: 3.9 mmol/L (ref 3.5–5.2)
Sodium: 143 mmol/L (ref 134–144)

## 2017-04-03 NOTE — Patient Instructions (Addendum)
Medication Instructions:   Your physician recommends that you continue on your current medications as directed. Please refer to the Current Medication list given to you today.   If you need a refill on your cardiac medications before your next appointment, please call your pharmacy.  Labwork:  BMET TODAY     Testing/Procedures:  NONE ORDERED  TODAY    Follow-Up:  Your physician wants you to follow-up in:  [place] will receive a reminder letter in the mail two months in advance. If you don't receive a letter, please call our office to schedule the follow-up appointment.  Remote monitoring is used to monitor your Pacemaker of ICD from home. This monitoring reduces the number of office visits required to check your device to one time per year. It allows [place] to keep an eye on the functioning of your device to ensure it is working properly. You are scheduled for a device check from home on ..05-22-17 You may send your transmission at any time that day. If you have a wireless device, the transmission will be sent automatically. After your physician reviews your transmission, you will receive a postcard with your next transmission date.      Any Other Special Instructions Will Be Listed Below (If Applicable).

## 2017-04-04 LAB — CUP PACEART INCLINIC DEVICE CHECK
Implantable Lead Implant Date: 20090316
Implantable Lead Location: 753859
Implantable Lead Model: 5076
Implantable Lead Model: 5076
Implantable Pulse Generator Implant Date: 20090316
MDC IDC LEAD IMPLANT DT: 20090316
MDC IDC LEAD LOCATION: 753860
MDC IDC SESS DTM: 20190404091442

## 2017-05-07 DIAGNOSIS — N400 Enlarged prostate without lower urinary tract symptoms: Secondary | ICD-10-CM | POA: Diagnosis not present

## 2017-05-07 DIAGNOSIS — I359 Nonrheumatic aortic valve disorder, unspecified: Secondary | ICD-10-CM | POA: Diagnosis not present

## 2017-05-07 DIAGNOSIS — Z950 Presence of cardiac pacemaker: Secondary | ICD-10-CM | POA: Diagnosis not present

## 2017-05-07 DIAGNOSIS — K635 Polyp of colon: Secondary | ICD-10-CM | POA: Diagnosis not present

## 2017-05-07 DIAGNOSIS — I5022 Chronic systolic (congestive) heart failure: Secondary | ICD-10-CM | POA: Diagnosis not present

## 2017-05-07 DIAGNOSIS — R319 Hematuria, unspecified: Secondary | ICD-10-CM | POA: Diagnosis not present

## 2017-05-07 DIAGNOSIS — E785 Hyperlipidemia, unspecified: Secondary | ICD-10-CM | POA: Diagnosis not present

## 2017-05-07 DIAGNOSIS — I10 Essential (primary) hypertension: Secondary | ICD-10-CM | POA: Diagnosis not present

## 2017-05-07 DIAGNOSIS — G4733 Obstructive sleep apnea (adult) (pediatric): Secondary | ICD-10-CM | POA: Diagnosis not present

## 2017-05-07 DIAGNOSIS — I712 Thoracic aortic aneurysm, without rupture: Secondary | ICD-10-CM | POA: Diagnosis not present

## 2017-05-07 DIAGNOSIS — B0229 Other postherpetic nervous system involvement: Secondary | ICD-10-CM | POA: Diagnosis not present

## 2017-05-22 ENCOUNTER — Encounter: Payer: 59 | Admitting: *Deleted

## 2017-05-22 ENCOUNTER — Telehealth: Payer: Self-pay | Admitting: Cardiology

## 2017-05-22 NOTE — Telephone Encounter (Signed)
Spoke with pt and reminded pt of remote transmission that is due today. Pt verbalized understanding.   

## 2017-05-23 ENCOUNTER — Encounter: Payer: Self-pay | Admitting: Cardiology

## 2017-05-24 ENCOUNTER — Other Ambulatory Visit: Payer: Self-pay | Admitting: Internal Medicine

## 2017-05-30 ENCOUNTER — Ambulatory Visit (INDEPENDENT_AMBULATORY_CARE_PROVIDER_SITE_OTHER): Payer: 59 | Admitting: *Deleted

## 2017-05-30 DIAGNOSIS — I495 Sick sinus syndrome: Secondary | ICD-10-CM

## 2017-05-31 NOTE — Progress Notes (Signed)
Remote pacemaker transmission.   

## 2017-06-03 ENCOUNTER — Encounter: Payer: Self-pay | Admitting: Cardiology

## 2017-06-07 DIAGNOSIS — I429 Cardiomyopathy, unspecified: Secondary | ICD-10-CM | POA: Diagnosis not present

## 2017-06-07 DIAGNOSIS — I712 Thoracic aortic aneurysm, without rupture: Secondary | ICD-10-CM | POA: Diagnosis not present

## 2017-06-07 DIAGNOSIS — E785 Hyperlipidemia, unspecified: Secondary | ICD-10-CM | POA: Diagnosis not present

## 2017-06-07 DIAGNOSIS — R310 Gross hematuria: Secondary | ICD-10-CM | POA: Diagnosis not present

## 2017-06-07 DIAGNOSIS — Z9989 Dependence on other enabling machines and devices: Secondary | ICD-10-CM | POA: Diagnosis not present

## 2017-06-07 DIAGNOSIS — I351 Nonrheumatic aortic (valve) insufficiency: Secondary | ICD-10-CM | POA: Diagnosis not present

## 2017-06-07 DIAGNOSIS — G4733 Obstructive sleep apnea (adult) (pediatric): Secondary | ICD-10-CM | POA: Diagnosis not present

## 2017-06-07 DIAGNOSIS — R768 Other specified abnormal immunological findings in serum: Secondary | ICD-10-CM | POA: Diagnosis not present

## 2017-06-07 DIAGNOSIS — N183 Chronic kidney disease, stage 3 (moderate): Secondary | ICD-10-CM | POA: Diagnosis not present

## 2017-06-07 DIAGNOSIS — I129 Hypertensive chronic kidney disease with stage 1 through stage 4 chronic kidney disease, or unspecified chronic kidney disease: Secondary | ICD-10-CM | POA: Diagnosis not present

## 2017-06-11 LAB — CUP PACEART REMOTE DEVICE CHECK
Date Time Interrogation Session: 20190611212359
Implantable Lead Implant Date: 20090316
Implantable Lead Location: 753859
Implantable Lead Location: 753860
Implantable Lead Model: 5076
Implantable Pulse Generator Implant Date: 20090316
Lead Channel Setting Pacing Amplitude: 2 V
Lead Channel Setting Pacing Amplitude: 2.5 V
Lead Channel Setting Pacing Pulse Width: 0.4 ms
Lead Channel Setting Sensing Sensitivity: 5.6 mV
MDC IDC LEAD IMPLANT DT: 20090316

## 2017-06-19 ENCOUNTER — Telehealth: Payer: Self-pay | Admitting: Cardiology

## 2017-06-19 NOTE — Telephone Encounter (Signed)
Patient had questions about the (3) VT-NS episodes that were documented on his remote transmission. I explained to patient that VT-NS is not something new for him and that the episodes he had were very brief. I told him that we will call him if Dr.Klein recommends any changes d/t the episodes. Patient verbalized understanding.

## 2017-06-19 NOTE — Telephone Encounter (Signed)
Patient called w/ questions about his remote transmission results from 05-30-2017. Informed him that a Device Investment banker, corporate will call him back. Pt verbalized understanding.

## 2017-07-01 DIAGNOSIS — G4733 Obstructive sleep apnea (adult) (pediatric): Secondary | ICD-10-CM | POA: Diagnosis not present

## 2017-07-22 DIAGNOSIS — G4733 Obstructive sleep apnea (adult) (pediatric): Secondary | ICD-10-CM | POA: Diagnosis not present

## 2017-07-28 ENCOUNTER — Other Ambulatory Visit: Payer: Self-pay | Admitting: Internal Medicine

## 2017-08-06 DIAGNOSIS — M25552 Pain in left hip: Secondary | ICD-10-CM | POA: Diagnosis not present

## 2017-08-06 DIAGNOSIS — M25551 Pain in right hip: Secondary | ICD-10-CM | POA: Diagnosis not present

## 2017-08-20 ENCOUNTER — Other Ambulatory Visit: Payer: Self-pay | Admitting: Internal Medicine

## 2017-08-22 ENCOUNTER — Telehealth: Payer: Self-pay | Admitting: Internal Medicine

## 2017-08-22 ENCOUNTER — Other Ambulatory Visit: Payer: Self-pay | Admitting: Internal Medicine

## 2017-08-22 MED ORDER — ATORVASTATIN CALCIUM 10 MG PO TABS: ORAL_TABLET | ORAL | 0 refills | Status: DC

## 2017-08-22 NOTE — Telephone Encounter (Signed)
Outpatient Medication Detail    Disp Refills Start End   atorvastatin (LIPITOR) 10 MG tablet 30 tablet 1 07/29/2017    Sig: TAKE 1 TABLET (10 MG TOTAL) BY MOUTH DAILY.   Sent to pharmacy as: atorvastatin (LIPITOR) 10 MG tablet   E-Prescribing Status: Receipt confirmed by pharmacy (07/29/2017 2:25 PM EDT)   Pharmacy   [id number] - [place], [place]

## 2017-08-22 NOTE — Telephone Encounter (Signed)
New message  Pt c/o medication issue:  1. Name of Medication: atorvastatin (LIPITOR) 10 MG tablet  2. How are you currently taking this medication (dosage and times per day)?  1 time daily at night   3. Are you having a reaction (difficulty breathing--STAT)? No   4. What is your medication issue? Patients wife states that [facility] the prescription that was sent to pharmacy is less than 90 day supply. The insurance Co. will not pay unless it is for a 90 day supply.

## 2017-08-22 NOTE — Telephone Encounter (Signed)
Pt's medication was sent to pt's pharmacy as requested. Confirmation received.  °

## 2017-08-27 ENCOUNTER — Telehealth: Payer: Self-pay

## 2017-08-27 NOTE — Telephone Encounter (Signed)
Pt's dentist's office called asking for a prescription of amoxicillin. He can't be seen without taking this med first. Would Dr. Caryl Comes be willing to write a script for him? Thank you.

## 2017-08-28 NOTE — Telephone Encounter (Signed)
Spoke with pt. Per Dr Caryl Comes, prophylactic antibiotics are not indicated for routine dental work. Pt stated he may need a root canal. I advised pt that his dentist would be able to see if he had an active oral infection, in which case his dentist would prescribe antibiotics potentially. Pt has verbalized understanding and had no additional questions.

## 2017-08-29 ENCOUNTER — Telehealth: Payer: Self-pay

## 2017-08-29 ENCOUNTER — Ambulatory Visit (INDEPENDENT_AMBULATORY_CARE_PROVIDER_SITE_OTHER): Payer: 59 | Admitting: *Deleted

## 2017-08-29 DIAGNOSIS — I428 Other cardiomyopathies: Secondary | ICD-10-CM

## 2017-08-29 DIAGNOSIS — I495 Sick sinus syndrome: Secondary | ICD-10-CM

## 2017-08-29 NOTE — Telephone Encounter (Signed)
Spoke with pt and reminded pt of remote transmission that is due today. Pt verbalized understanding.   

## 2017-08-30 NOTE — Progress Notes (Signed)
Remote pacemaker transmission.   

## 2017-09-06 ENCOUNTER — Other Ambulatory Visit: Payer: Self-pay | Admitting: Internal Medicine

## 2017-09-20 LAB — CUP PACEART REMOTE DEVICE CHECK
Battery Impedance: 1962 Ohm
Battery Remaining Longevity: 32 mo
Battery Voltage: 2.75 V
Brady Statistic AP VS Percent: 86 %
Brady Statistic AS VP Percent: 1 %
Brady Statistic AS VS Percent: 3 %
Date Time Interrogation Session: 20190830012516
Implantable Lead Implant Date: 20090316
Implantable Lead Location: 753859
Implantable Lead Model: 5076
Lead Channel Pacing Threshold Amplitude: 0.625 V
Lead Channel Pacing Threshold Pulse Width: 0.4 ms
Lead Channel Pacing Threshold Pulse Width: 0.4 ms
Lead Channel Setting Pacing Amplitude: 2 V
Lead Channel Setting Pacing Pulse Width: 0.4 ms
MDC IDC LEAD IMPLANT DT: 20090316
MDC IDC LEAD LOCATION: 753860
MDC IDC MSMT LEADCHNL RA IMPEDANCE VALUE: 373 Ohm
MDC IDC MSMT LEADCHNL RA PACING THRESHOLD AMPLITUDE: 0.5 V
MDC IDC MSMT LEADCHNL RV IMPEDANCE VALUE: 515 Ohm
MDC IDC PG IMPLANT DT: 20090316
MDC IDC SET LEADCHNL RV PACING AMPLITUDE: 2.5 V
MDC IDC SET LEADCHNL RV SENSING SENSITIVITY: 5.6 mV
MDC IDC STAT BRADY AP VP PERCENT: 10 %

## 2017-09-24 ENCOUNTER — Ambulatory Visit (INDEPENDENT_AMBULATORY_CARE_PROVIDER_SITE_OTHER): Payer: 59 | Admitting: Physician Assistant

## 2017-09-24 ENCOUNTER — Encounter: Payer: Self-pay | Admitting: Physician Assistant

## 2017-09-24 VITALS — BP 110/60 | HR 77 | Ht 72.0 in | Wt 211.8 lb

## 2017-09-24 DIAGNOSIS — N183 Chronic kidney disease, stage 3 unspecified: Secondary | ICD-10-CM | POA: Insufficient documentation

## 2017-09-24 DIAGNOSIS — I712 Thoracic aortic aneurysm, without rupture, unspecified: Secondary | ICD-10-CM

## 2017-09-24 DIAGNOSIS — Z23 Encounter for immunization: Secondary | ICD-10-CM | POA: Diagnosis not present

## 2017-09-24 DIAGNOSIS — Z79899 Other long term (current) drug therapy: Secondary | ICD-10-CM

## 2017-09-24 DIAGNOSIS — I359 Nonrheumatic aortic valve disorder, unspecified: Secondary | ICD-10-CM | POA: Diagnosis not present

## 2017-09-24 DIAGNOSIS — I5022 Chronic systolic (congestive) heart failure: Secondary | ICD-10-CM | POA: Diagnosis not present

## 2017-09-24 DIAGNOSIS — I493 Ventricular premature depolarization: Secondary | ICD-10-CM

## 2017-09-24 LAB — COMPREHENSIVE METABOLIC PANEL
A/G RATIO: 2.6 — AB (ref 1.2–2.2)
ALT: 17 IU/L (ref 0–44)
AST: 13 IU/L (ref 0–40)
Albumin: 4.5 g/dL (ref 3.6–4.8)
Alkaline Phosphatase: 79 IU/L (ref 39–117)
BILIRUBIN TOTAL: 0.7 mg/dL (ref 0.0–1.2)
BUN/Creatinine Ratio: 11 (ref 10–24)
BUN: 14 mg/dL (ref 8–27)
CHLORIDE: 101 mmol/L (ref 96–106)
CO2: 23 mmol/L (ref 20–29)
Calcium: 9.4 mg/dL (ref 8.6–10.2)
Creatinine, Ser: 1.28 mg/dL — ABNORMAL HIGH (ref 0.76–1.27)
GFR calc non Af Amer: 57 mL/min/{1.73_m2} — ABNORMAL LOW (ref >59)
GFR, EST AFRICAN AMERICAN: 66 mL/min/{1.73_m2} (ref >59)
GLOBULIN, TOTAL: 1.7 g/dL (ref 1.5–4.5)
Glucose: 91 mg/dL (ref 65–99)
POTASSIUM: 4.4 mmol/L (ref 3.5–5.2)
Sodium: 139 mmol/L (ref 134–144)
TOTAL PROTEIN: 6.2 g/dL (ref 6.0–8.5)

## 2017-09-24 LAB — TSH: TSH: 2.72 u[IU]/mL (ref 0.450–4.500)

## 2017-09-24 MED ORDER — ATORVASTATIN CALCIUM 10 MG PO TABS: ORAL_TABLET | ORAL | 3 refills | Status: DC

## 2017-09-24 NOTE — Patient Instructions (Signed)
Medication Instructions:  1. A REFILL FOR ATORVASTATIN WAS SENT IN  Labwork: 1. TODAY CMET, TSH  Testing/Procedures: 1. Your physician has requested that you have an echocardiogram. Echocardiography is a painless test that uses sound waves to create images of your heart. It provides your doctor with information about the size and shape of your heart and how well your heart's chambers and valves are working. This procedure takes approximately one hour. There are no restrictions for this procedure.  SEE IF CAN BE DONE BEFORE APPT WITH DR. Caryl Comes 10/08/17  2. YOU WILL NEED TO SCHEDULE CHEST CTA AORTA TO BE DONE IN 12/2017 PER SCOTT WEAVER, PA ORDER ALREADY IN EPIC  Follow-Up: KEEP YOUR APPT WITH DR. Caryl Comes 10/08/17  6 MONTHS WITH DR. ROSS  Any Other Special Instructions Will Be Listed Below (If Applicable).     If you need a refill on your cardiac medications before your next appointment, please call your pharmacy.

## 2017-09-24 NOTE — Progress Notes (Signed)
Cardiology Office Note:    Date:  09/24/2017   ID:  Patrick Weaver, DOB [birth year], MRN [id number]  PCP:  Vernie Shanks, MD  Cardiologist:  Dorris Carnes, MD  Electrophysiologist:  Virl Axe, MD   Referring MD: Vernie Shanks, MD   Chief Complaint  Patient presents with  . Follow-up    CHF    History of Present Illness:    Patrick Weaver is a 72 y.o. male with chronic systolic heart failure secondary to nonischemic cardiomyopathy, PVCs controlled with amiodarone, bradycardia status post pacemaker, chronic kidney disease, aortic insufficiency, ascending thoracic aortic aneurysm, sleep apnea, hypertension, hyperlipidemia.  Cardiac catheterization in 02/2016 demonstrated no CAD.  Last echocardiogram in October 2018 demonstrated EF 25% with mild aortic insufficiency.  CT in December 2018 demonstrated stable aneurysmal disease of the aortic root and ascending thoracic aorta (root 4.6 cm, ascending thoracic aorta 4.5 cm).  He was last seen by Dr. [name] in September 2018.  He was most recently seen by Chanetta Marshall, NP for EP follow up.    Patrick Weaver returns for follow-up.  He is here alone.  Since last seen, he has been doing well.  He does note shortness of breath and chest discomfort with more extreme activity.  This is a chronic symptom without significant change over years.  He does note that he may feel somewhat better since his medications have been adjusted.  He denies syncope, orthopnea, lower extremity swelling or weight gain.  He uses BiPAP at night.  He denies any bleeding issues.  Prior CV studies:   The following studies were reviewed today:  Chest CTA 12/04/2016 IMPRESSION: 1. Stable aneurysmal disease of the aortic root and ascending thoracic aorta. Measurements are stable since 2011 with maximal diameter of the ascending thoracic aorta of 4.5 cm. Aortic root at the level of the sinuses of Valsalva measures approximately 4.6 cm. 2. Stable scattered tiny nodules in the right  upper lobe. 3. Stable scattered small hepatic cysts and hepatic steatosis.  Echo 10/08/2016 Moderate LVH, EF 25, mild AI, aortic annulus dilated at 53 mm, sinotubular junction is 34 mm, proximal ascending aorta is 39 mm  Cardiac catheterization 02/15/2016 1. No angiographic evidence of CAD 2. Non-ischemic cardiomyopathy  Echo 12/02/2015 Mild LVH, EF 25-30, diffuse HK, mild AI, mild MR, severe LAE, mild RAE, PASP 42  Holter 11/30/2015 SR   61 to 138 bpm  Average HR 72 bpm Occasional PVC (1.8% total beats)  Echo 07/22/2015 EF 25-30, severe diffuse HK (disproportionately severe HK of the inferolateral and inferior myocardium), mild to moderate AI, aortic root 44 mm, ascending aorta 40 mm, severe LAE, mild RAE, PASP 36  Past Medical History:  Diagnosis Date  . Aneurysm of thoracic aorta ([place]) 07/06/2008   CT 12/18: Ascending aorta 4.5 cm, aortic root 4.6 cm    . Bladder stone   . BPH (benign prostatic hyperplasia)   . Bradycardia    a.s/p MDT dual chamber pacemaker  . Chronic systolic heart failure ([place]) 08/13/2015   Echo 10/18: Moderate LVH, EF 25, mild AI, aortic annulus dilated at 53 mm, sinotubular junction is 34 mm, proximal ascending aorta is 39 mm // Echo 12/17: Mild LVH, EF 25-30, diffuse HK, mild AI, mild MR, severe LAE, mild RAE, PASP 42 // Echo 7/17: EF 25-30, severe diffuse HK (disproportionately severe HK of the inferolateral and inferior myocardium), mild to moderate AI, aortic root 44 mm, ascend  . Dyslipidemia    failed niaspan  .  HTN (hypertension)   . NICM (nonischemic cardiomyopathy) ([place])    LHC2/18: no angiographic CAD  . PVCs (premature ventricular contractions)    controlled on Amiodarone // Holter 11/17: PVCs 1.8%  . Sleep apnea    USES C-PAP   Surgical Hx: The patient  has a past surgical history that includes Cardiac catheterization (2009); Tonsillectomy; Cystoscopy with litholapaxy (N/A, 07/12/2014); Transurethral resection of prostate (N/A, 07/12/2014); and  LEFT HEART CATH AND CORONARY ANGIOGRAPHY (N/A, 02/15/2016).   Current Medications: Current Meds  Medication Sig  . acetaminophen (TYLENOL) 325 MG tablet Take 2 tablets (650 mg total) by mouth every 6 (six) hours as needed for mild pain (or Fever >/= 101).  Marland Kitchen amiodarone (PACERONE) 200 MG tablet Take 0.5 tablets (100 mg total) daily by mouth.  Marland Kitchen atorvastatin (LIPITOR) 10 MG tablet TAKE 1 TABLET (10 MG TOTAL) BY MOUTH DAILY.  . carvedilol (COREG) 6.25 MG tablet Take 1 tablet (6.25 mg total) by mouth 2 (two) times daily.  Marland Kitchen docusate sodium (COLACE) 100 MG capsule Take 100 mg by mouth daily as needed for mild constipation or moderate constipation.  . finasteride (PROSCAR) 5 MG tablet Take 5 mg by mouth daily.  Marland Kitchen gabapentin (NEURONTIN) 300 MG capsule Take 600 mg by mouth 2 (two) times daily.   . sacubitril-valsartan (ENTRESTO) 24-26 MG Take 1 tablet by mouth 2 (two) times daily.  . tamsulosin (FLOMAX) 0.4 MG CAPS capsule Take 0.4 mg by mouth daily as needed.   . vitamin B-12 (CYANOCOBALAMIN) 1000 MCG tablet Take 1,000 mcg by mouth daily.  . [DISCONTINUED] atorvastatin (LIPITOR) 10 MG tablet TAKE 1 TABLET (10 MG TOTAL) BY MOUTH DAILY. Please keep upcoming appt with Dr. [name] for future refills. Thank you     Allergies:   Codeine   Social History   Tobacco Use  . Smoking status: Never Smoker  . Smokeless tobacco: Never Used  Substance Use Topics  . Alcohol use: No  . Drug use: No     Family Hx: The patient's family history includes Cancer in his mother; Colon cancer in his maternal grandfather; Congestive Heart Failure in his father; Heart attack in his father; Heart disease in his father; Hypertension in his brother and mother. There is no history of Stomach cancer or Esophageal cancer.  ROS:   Please see the history of present illness.    ROS All other systems reviewed and are negative.   EKGs/Labs/Other Test Reviewed:    EKG:  EKG is  ordered today.  The ekg ordered today  demonstrates atrial paced, HR 77, similar to last tracing  Recent Labs: 02/20/2017: ALT 16; TSH 3.630 04/03/2017: BUN 12; Creatinine, Ser 1.32; Potassium 3.9; Sodium 143   Recent Lipid Panel Lab Results  Component Value Date/Time   CHOL 114 (L) 10/21/2015 09:41 AM   TRIG 123 10/21/2015 09:41 AM   HDL 31 (L) 10/21/2015 09:41 AM   CHOLHDL 3.7 10/21/2015 09:41 AM   LDLCALC 58 10/21/2015 09:41 AM   LDLDIRECT 98.6 05/30/2006 08:56 AM   From [facility] Cholesterol, total 106.000 05/07/2017 HDL 27.000 05/07/2017 LDL 60.000 05/07/2017 Triglycerides 92.000 05/07/2017 Hemoglobin 14.500 g/ 11/26/2016 Creatinine, Serum 1.340 MG/ 06/07/2017 Potassium 4.200 05/07/2017 Magnesium N/D ALT (SGPT) 16.000 IU/ 06/07/2017 TSH 3.630 02/20/2017   Physical Exam:    VS:  BP 110/60   Pulse 77   Ht 6' (1.829 m)   Wt 211 lb 12.8 oz (96.1 kg)   SpO2 96%   BMI 28.73 kg/m     Wt  Readings from Last 3 Encounters:  09/24/17 211 lb 12.8 oz (96.1 kg)  04/03/17 213 lb (96.6 kg)  02/20/17 217 lb (98.4 kg)     Physical Exam  Constitutional: He is oriented to person, place, and time. He appears well-developed and well-nourished. No distress.  HENT:  Head: Normocephalic and atraumatic.  Eyes: No scleral icterus.  Neck: No JVD present. Carotid bruit is not present. No thyromegaly present.  Cardiovascular: Normal rate, regular rhythm and normal heart sounds.  No murmur heard. Pulmonary/Chest: Effort normal and breath sounds normal. He has no rales.  Abdominal: Soft. There is no hepatomegaly.  Musculoskeletal: He exhibits no edema.  Lymphadenopathy:    He has no cervical adenopathy.  Neurological: He is alert and oriented to person, place, and time.  Skin: Skin is warm and dry.  Psychiatric: He has a normal mood and affect.    ASSESSMENT & PLAN:    Chronic systolic heart failure (HCC) EF 25% by echocardiogram 10/18.  He is NYHA 2.  Volume status appears stable.  Continue current therapy which includes carvedilol,  Entresto, spinal lactone.  Obtain follow-up CMET today.  Arrange follow-up echocardiogram on maximal guideline directed therapy for heart failure.  If EF remains <35, consider ICD.  He has follow-up with Dr. Caryl Comes in October.  We will see if his echocardiogram can be done prior to that visit.  Thoracic aortic aneurysm without rupture [facility]) Chest CT is due in December 2019.  Continue beta-blocker.  PVC's (premature ventricular contractions) Controlled with amiodarone.  Aortic valve disorder Mild aortic insufficiency by echocardiogram 10/18.  Obtain follow-up echocardiogram.  CKD (chronic kidney disease) stage 3, GFR 30-59 ml/min (HCC) He is followed by nephrology.  Obtain follow-up CMET.  On amiodarone therapy Obtain follow-up CMET, TSH.  Influenza vaccination administered at current visit Influenza vaccine was administered today.   Dispo:  Return in about 6 months (around 03/25/2018) for Routine Follow Up, w/ Dr. [name].   Medication Adjustments/Labs and Tests Ordered: Current medicines are reviewed at length with the patient today.  Concerns regarding medicines are outlined above.  Tests Ordered: Orders Placed This Encounter  Procedures  . Flu vaccine HIGH DOSE PF  . Comp Met (CMET)  . TSH  . EKG 12-Lead  . ECHOCARDIOGRAM COMPLETE   Medication Changes: Meds ordered this encounter  Medications  . atorvastatin (LIPITOR) 10 MG tablet    Sig: TAKE 1 TABLET (10 MG TOTAL) BY MOUTH DAILY.    Dispense:  90 tablet    Refill:  3    Signed, Richardson Dopp, PA-C  09/24/2017 12:54 PM    [address], [place], [place]  [address] Phone: [phone]; Fax: [phone]

## 2017-09-26 ENCOUNTER — Ambulatory Visit (HOSPITAL_COMMUNITY): Payer: 59 | Attending: Cardiovascular Disease

## 2017-09-26 ENCOUNTER — Encounter: Payer: Self-pay | Admitting: Physician Assistant

## 2017-09-26 ENCOUNTER — Other Ambulatory Visit: Payer: Self-pay

## 2017-09-26 DIAGNOSIS — I517 Cardiomegaly: Secondary | ICD-10-CM | POA: Insufficient documentation

## 2017-09-26 DIAGNOSIS — I351 Nonrheumatic aortic (valve) insufficiency: Secondary | ICD-10-CM | POA: Diagnosis not present

## 2017-09-26 DIAGNOSIS — G4733 Obstructive sleep apnea (adult) (pediatric): Secondary | ICD-10-CM | POA: Insufficient documentation

## 2017-09-26 DIAGNOSIS — I5022 Chronic systolic (congestive) heart failure: Secondary | ICD-10-CM | POA: Diagnosis not present

## 2017-09-26 DIAGNOSIS — Z8249 Family history of ischemic heart disease and other diseases of the circulatory system: Secondary | ICD-10-CM | POA: Insufficient documentation

## 2017-09-26 DIAGNOSIS — Z950 Presence of cardiac pacemaker: Secondary | ICD-10-CM | POA: Diagnosis not present

## 2017-09-26 DIAGNOSIS — I493 Ventricular premature depolarization: Secondary | ICD-10-CM | POA: Diagnosis not present

## 2017-09-26 DIAGNOSIS — I428 Other cardiomyopathies: Secondary | ICD-10-CM | POA: Insufficient documentation

## 2017-10-02 DIAGNOSIS — G4733 Obstructive sleep apnea (adult) (pediatric): Secondary | ICD-10-CM | POA: Diagnosis not present

## 2017-10-07 DIAGNOSIS — E785 Hyperlipidemia, unspecified: Secondary | ICD-10-CM | POA: Diagnosis not present

## 2017-10-07 DIAGNOSIS — Z950 Presence of cardiac pacemaker: Secondary | ICD-10-CM | POA: Diagnosis not present

## 2017-10-07 DIAGNOSIS — Z0000 Encounter for general adult medical examination without abnormal findings: Secondary | ICD-10-CM | POA: Diagnosis not present

## 2017-10-07 DIAGNOSIS — I712 Thoracic aortic aneurysm, without rupture: Secondary | ICD-10-CM | POA: Diagnosis not present

## 2017-10-07 DIAGNOSIS — L659 Nonscarring hair loss, unspecified: Secondary | ICD-10-CM | POA: Diagnosis not present

## 2017-10-07 DIAGNOSIS — N183 Chronic kidney disease, stage 3 (moderate): Secondary | ICD-10-CM | POA: Diagnosis not present

## 2017-10-07 DIAGNOSIS — G4733 Obstructive sleep apnea (adult) (pediatric): Secondary | ICD-10-CM | POA: Diagnosis not present

## 2017-10-07 DIAGNOSIS — I5022 Chronic systolic (congestive) heart failure: Secondary | ICD-10-CM | POA: Diagnosis not present

## 2017-10-07 DIAGNOSIS — Z125 Encounter for screening for malignant neoplasm of prostate: Secondary | ICD-10-CM | POA: Diagnosis not present

## 2017-10-07 DIAGNOSIS — H6123 Impacted cerumen, bilateral: Secondary | ICD-10-CM | POA: Diagnosis not present

## 2017-10-07 DIAGNOSIS — Z79899 Other long term (current) drug therapy: Secondary | ICD-10-CM | POA: Diagnosis not present

## 2017-10-07 DIAGNOSIS — I359 Nonrheumatic aortic valve disorder, unspecified: Secondary | ICD-10-CM | POA: Diagnosis not present

## 2017-10-08 ENCOUNTER — Encounter: Payer: Self-pay | Admitting: Internal Medicine

## 2017-10-08 ENCOUNTER — Ambulatory Visit (INDEPENDENT_AMBULATORY_CARE_PROVIDER_SITE_OTHER): Payer: 59 | Admitting: Internal Medicine

## 2017-10-08 VITALS — BP 102/78 | HR 78 | Ht 72.0 in | Wt 213.2 lb

## 2017-10-08 DIAGNOSIS — I359 Nonrheumatic aortic valve disorder, unspecified: Secondary | ICD-10-CM

## 2017-10-08 DIAGNOSIS — I712 Thoracic aortic aneurysm, without rupture, unspecified: Secondary | ICD-10-CM

## 2017-10-08 DIAGNOSIS — I428 Other cardiomyopathies: Secondary | ICD-10-CM | POA: Diagnosis not present

## 2017-10-08 DIAGNOSIS — I5022 Chronic systolic (congestive) heart failure: Secondary | ICD-10-CM | POA: Diagnosis not present

## 2017-10-08 DIAGNOSIS — I493 Ventricular premature depolarization: Secondary | ICD-10-CM

## 2017-10-08 LAB — CUP PACEART INCLINIC DEVICE CHECK
Battery Impedance: 2015 Ohm
Battery Remaining Longevity: 31 mo
Battery Voltage: 2.76 V
Brady Statistic AP VP Percent: 10 %
Brady Statistic AS VP Percent: 1 %
Date Time Interrogation Session: 20191008171242
Implantable Lead Implant Date: 20090316
Implantable Lead Location: 753860
Implantable Lead Model: 5076
Implantable Lead Model: 5076
Implantable Pulse Generator Implant Date: 20090316
Lead Channel Impedance Value: 372 Ohm
Lead Channel Pacing Threshold Amplitude: 0.5 V
Lead Channel Pacing Threshold Pulse Width: 0.4 ms
Lead Channel Sensing Intrinsic Amplitude: 2 mV
Lead Channel Setting Pacing Amplitude: 2.5 V
MDC IDC LEAD IMPLANT DT: 20090316
MDC IDC LEAD LOCATION: 753859
MDC IDC MSMT LEADCHNL RV IMPEDANCE VALUE: 531 Ohm
MDC IDC MSMT LEADCHNL RV PACING THRESHOLD AMPLITUDE: 0.75 V
MDC IDC MSMT LEADCHNL RV PACING THRESHOLD PULSEWIDTH: 0.4 ms
MDC IDC MSMT LEADCHNL RV SENSING INTR AMPL: 11.2 mV
MDC IDC SET LEADCHNL RA PACING AMPLITUDE: 2 V
MDC IDC SET LEADCHNL RV PACING PULSEWIDTH: 0.4 ms
MDC IDC SET LEADCHNL RV SENSING SENSITIVITY: 5.6 mV
MDC IDC STAT BRADY AP VS PERCENT: 86 %
MDC IDC STAT BRADY AS VS PERCENT: 3 %

## 2017-10-08 NOTE — Patient Instructions (Signed)
Medication Instructions:  Your physician recommends that you continue on your current medications as directed. Please refer to the Current Medication list given to you today.  If you need a refill on your cardiac medications before your next appointment, please call your pharmacy.   Lab work: None ordered.  If you have labs (blood work) drawn today and your tests are completely normal, you will receive your results only by: Marland Kitchen MyChart Message (if you have MyChart) OR . A paper copy in the mail If you have any lab test that is abnormal or we need to change your treatment, we will call you to review the results.  Testing/Procedures: None ordered.  Follow-Up: At [facility], you and your health needs are our priority.  As part of our continuing mission to provide you with exceptional heart care, we have created designated Provider Care Teams.  These Care Teams include your primary Cardiologist (physician) and Advanced Practice Providers (APPs -  Physician Assistants and Nurse Practitioners) who all work together to provide you with the care you need, when you need it. You will need a follow up appointment in 6 months.  Please call our office 2 months in advance to schedule this appointment.  You may see Virl Axe, MD or one of the following Advanced Practice Providers on your designated Care Team:   Chanetta Marshall, NP . Tommye Standard, PA-C  Any Other Special Instructions Will Be Listed Below (If Applicable).

## 2017-10-08 NOTE — Progress Notes (Signed)
Heart rate is much so     Patient Care Team: Vernie Shanks, MD as PCP - General (Family Medicine) Fay Records, MD as PCP - Cardiology (Cardiology) Deboraha Sprang, MD as PCP - Electrophysiology (Cardiology)   HPI  Patrick Weaver is a 72 y.o. male seen in followup for pacer implantation for bradycardia. He has also had significant problems with PVCs. This wastreated with flecanide with much improvement. As noted below,  significant interval decrease in left ventricular function that prompted the discontinuation of flecainide and the initiation of amiodarone. PVCs were obliterated but there is no interval improvement in LVEF. Hence he underwent catheterization.      DATE TEST EF   8/15 Echo  65 %   7/17  Echo   25 % LAE (52/2.5/35)  12/17 Echo  25% AI  mild   2/18 Cath  No obstructive CAD  8/18 Echo  20-25%    12/18 CT Aorta  4.5 cm Aneruysm  9/19 Echo  25-30% LAE severe (57/2.6/28)    Date Cr TSH LFTs PFTs  10/17   3.19 29    5/18  1.37 2.47 18 DLCO stable  9/19 1.28 2.72 17    He continues to struggle with shortness of breath accompanied by chest tightness with moderate exertion.  No peripheral edema.    Past Medical History:  Diagnosis Date  . Aneurysm of thoracic aorta ([place]) 07/06/2008   CT 12/18: Ascending aorta 4.5 cm, aortic root 4.6 cm    . Bladder stone   . BPH (benign prostatic hyperplasia)   . Bradycardia    a.s/p MDT dual chamber pacemaker  . Chronic systolic heart failure ([place]) 08/13/2015   Echo 10/18: Moderate LVH, EF 25, mild AI, aortic annulus dilated at 53 mm, sinotubular junction is 34 mm, proximal ascending aorta is 39 mm // Echo 12/17: Mild LVH, EF 25-30, diffuse HK, mild AI, mild MR, severe LAE, mild RAE, PASP 42 // Echo 7/17: EF 25-30, severe diffuse HK (disproportionately severe HK of the inferolateral and inferior myocardium), mild to moderate AI, aortic root 44 mm, ascend  . Dyslipidemia    failed niaspan  . History of echocardiogram    Echo  09/26/2017: EF 25-30, normal wall motion, grade 2 diastolic dysfunction, mild AI, severe LAE, mildly reduced RVSF  . HTN (hypertension)   . NICM (nonischemic cardiomyopathy) ([place])    LHC2/18: no angiographic CAD  . PVCs (premature ventricular contractions)    controlled on Amiodarone // Holter 11/17: PVCs 1.8%  . Sleep apnea    USES C-PAP    Past Surgical History:  Procedure Laterality Date  . CYSTOSCOPY WITH LITHOLAPAXY N/A 07/12/2014   Procedure: CYSTOSCOPY WITH LITHOLAPAXY;  Surgeon: Raynelle Bring, MD;  Location: [facility];  Service: Urology;  Laterality: N/A;  . EP IMPLANTABLE DEVICE  2009  . LEFT HEART CATH AND CORONARY ANGIOGRAPHY N/A 02/15/2016   Procedure: Left Heart Cath and Coronary Angiography;  Surgeon: Burnell Blanks, MD;  Location: [place] [facility];  Service: Cardiovascular;  Laterality: N/A;  . TONSILLECTOMY    . TRANSURETHRAL RESECTION OF PROSTATE N/A 07/12/2014   Procedure: TRANSURETHRAL RESECTION OF THE PROSTATE (TURP);  Surgeon: Raynelle Bring, MD;  Location: [facility];  Service: Urology;  Laterality: N/A;    Current Outpatient Medications  Medication Sig Dispense Refill  . acetaminophen (TYLENOL) 325 MG tablet Take 2 tablets (650 mg total) by mouth every 6 (six) hours as needed for mild pain (or Fever >/= 101). [place]  tablet 0  . amiodarone (PACERONE) 200 MG tablet Take 0.5 tablets (100 mg total) daily by mouth. 90 tablet 3  . atorvastatin (LIPITOR) 10 MG tablet TAKE 1 TABLET (10 MG TOTAL) BY MOUTH DAILY. 90 tablet 3  . carvedilol (COREG) 6.25 MG tablet Take 1 tablet (6.25 mg total) by mouth 2 (two) times daily. 180 tablet 3  . docusate sodium (COLACE) 100 MG capsule Take 100 mg by mouth daily as needed for mild constipation or moderate constipation.    . finasteride (PROSCAR) 5 MG tablet Take 5 mg by mouth daily.  11  . gabapentin (NEURONTIN) 300 MG capsule Take 600 mg by mouth 2 (two) times daily.     . sacubitril-valsartan (ENTRESTO) 24-26 MG Take 1 tablet by mouth 2  (two) times daily. 180 tablet 2  . spironolactone (ALDACTONE) 25 MG tablet Take 0.5 tablets (12.5 mg total) by mouth daily. 45 tablet 3  . tamsulosin (FLOMAX) 0.4 MG CAPS capsule Take 0.4 mg by mouth daily as needed.     . vitamin B-12 (CYANOCOBALAMIN) 1000 MCG tablet Take 1,000 mcg by mouth daily.     No current facility-administered medications for this visit.     Allergies  Allergen Reactions  . Codeine Nausea Only    Review of Systems negative except from HPI and PMH  Physical Exam BP 102/78   Pulse 78   Ht 6' (1.829 m)   Wt 213 lb 3.2 oz (96.7 kg)   SpO2 97%   BMI 28.92 kg/m  Well developed and nourished in no acute distress HENT normal Neck supple with JVP-flat Clear Regular rate and rhythm, no murmurs or gallops Abd-soft with active BS No Clubbing cyanosis edema Skin-warm and dry A & Oriented  Grossly normal sensory and motor function     ECG from 9/19 demonstrated atrial pacing at 77 Intervals 33/12/41   Asssessment and  Plan  Sinus node dysfunction  Pacemaker-[employer] The patient's device was interrogated.  The information was reviewed. No changes were made in the programming.    PVCs   HTN  Renal insufficiency grade 3  NICM  First-degree AV block  Congestive heart failure-chronic-systolic-class III  Aortic root dilatation with central AI  Amiodarone   PVCs are about 3%.  We will continue amiodarone.  Surveillance laboratories were within limits  Question was raised as to an ICD; based on [place], given his age and nonischemic myopathy, it would not upgrade his device.  We have reprogrammed his device decreasing his threshold from medium low--medium high decreasing his ADL slope as well as his exercise slope.  This resulted in his heart rate with walking going from 140--105.  He felt better.  We spent more than 50% of our >25 min visit in face to face counseling regarding the above

## 2017-11-07 DIAGNOSIS — M25562 Pain in left knee: Secondary | ICD-10-CM | POA: Diagnosis not present

## 2017-11-07 DIAGNOSIS — M25561 Pain in right knee: Secondary | ICD-10-CM | POA: Diagnosis not present

## 2017-11-29 ENCOUNTER — Other Ambulatory Visit: Payer: 59

## 2017-12-02 ENCOUNTER — Other Ambulatory Visit: Payer: Self-pay | Admitting: Internal Medicine

## 2017-12-02 ENCOUNTER — Other Ambulatory Visit: Payer: Self-pay | Admitting: *Deleted

## 2017-12-02 ENCOUNTER — Other Ambulatory Visit: Payer: 59 | Admitting: *Deleted

## 2017-12-02 DIAGNOSIS — Z01812 Encounter for preprocedural laboratory examination: Secondary | ICD-10-CM

## 2017-12-02 LAB — BASIC METABOLIC PANEL
BUN / CREAT RATIO: 12 (ref 10–24)
BUN: 15 mg/dL (ref 8–27)
CALCIUM: 9.1 mg/dL (ref 8.6–10.2)
CO2: 22 mmol/L (ref 20–29)
CREATININE: 1.26 mg/dL (ref 0.76–1.27)
Chloride: 104 mmol/L (ref 96–106)
GFR, EST AFRICAN AMERICAN: 67 mL/min/{1.73_m2} (ref >59)
GFR, EST NON AFRICAN AMERICAN: 58 mL/min/{1.73_m2} — AB (ref >59)
GLUCOSE: 95 mg/dL (ref 65–99)
Potassium: 4.3 mmol/L (ref 3.5–5.2)
SODIUM: 142 mmol/L (ref 134–144)

## 2017-12-05 ENCOUNTER — Ambulatory Visit (INDEPENDENT_AMBULATORY_CARE_PROVIDER_SITE_OTHER): Payer: 59

## 2017-12-05 DIAGNOSIS — I428 Other cardiomyopathies: Secondary | ICD-10-CM

## 2017-12-05 NOTE — Progress Notes (Signed)
Remote pacemaker transmission.   

## 2017-12-06 ENCOUNTER — Ambulatory Visit (INDEPENDENT_AMBULATORY_CARE_PROVIDER_SITE_OTHER)
Admission: RE | Admit: 2017-12-06 | Discharge: 2017-12-06 | Disposition: A | Discharge status: Home / Self Care | Payer: 59 | Source: Ambulatory Visit | Attending: Internal Medicine | Admitting: Internal Medicine

## 2017-12-06 DIAGNOSIS — I712 Thoracic aortic aneurysm, without rupture, unspecified: Secondary | ICD-10-CM

## 2017-12-06 MED ORDER — IOPAMIDOL (ISOVUE-370) INJECTION 76%
100.0000 mL | Freq: Once | INTRAVENOUS | Status: AC | PRN
  Administered 2017-12-06: 100 mL via INTRAVENOUS

## 2017-12-10 ENCOUNTER — Encounter: Payer: Self-pay | Admitting: Cardiology

## 2018-01-19 LAB — CUP PACEART REMOTE DEVICE CHECK
Battery Impedance: 2000 Ohm
Battery Remaining Longevity: 32 mo
Battery Voltage: 2.75 V
Brady Statistic AP VP Percent: 12 %
Brady Statistic AP VS Percent: 84 %
Brady Statistic AS VP Percent: 1 %
Brady Statistic AS VS Percent: 3 %
Implantable Lead Implant Date: 20090316
Implantable Lead Implant Date: 20090316
Implantable Lead Location: 753859
Implantable Lead Location: 753860
Implantable Lead Model: 5076
Implantable Lead Model: 5076
Implantable Pulse Generator Implant Date: 20090316
Lead Channel Impedance Value: 412 Ohm
Lead Channel Impedance Value: 513 Ohm
Lead Channel Pacing Threshold Amplitude: 0.375 V
Lead Channel Pacing Threshold Pulse Width: 0.4 ms
Lead Channel Pacing Threshold Pulse Width: 0.4 ms
Lead Channel Setting Pacing Amplitude: 2 V
Lead Channel Setting Pacing Amplitude: 2.5 V
Lead Channel Setting Pacing Pulse Width: 0.4 ms
MDC IDC MSMT LEADCHNL RV PACING THRESHOLD AMPLITUDE: 0.75 V
MDC IDC SESS DTM: 20191203151755
MDC IDC SET LEADCHNL RV SENSING SENSITIVITY: 5.6 mV

## 2018-01-20 DIAGNOSIS — I129 Hypertensive chronic kidney disease with stage 1 through stage 4 chronic kidney disease, or unspecified chronic kidney disease: Secondary | ICD-10-CM | POA: Diagnosis not present

## 2018-01-20 DIAGNOSIS — R310 Gross hematuria: Secondary | ICD-10-CM | POA: Diagnosis not present

## 2018-01-20 DIAGNOSIS — N183 Chronic kidney disease, stage 3 (moderate): Secondary | ICD-10-CM | POA: Diagnosis not present

## 2018-01-20 DIAGNOSIS — I712 Thoracic aortic aneurysm, without rupture: Secondary | ICD-10-CM | POA: Diagnosis not present

## 2018-01-20 DIAGNOSIS — I429 Cardiomyopathy, unspecified: Secondary | ICD-10-CM | POA: Diagnosis not present

## 2018-01-20 DIAGNOSIS — R768 Other specified abnormal immunological findings in serum: Secondary | ICD-10-CM | POA: Diagnosis not present

## 2018-01-20 DIAGNOSIS — N400 Enlarged prostate without lower urinary tract symptoms: Secondary | ICD-10-CM | POA: Diagnosis not present

## 2018-01-21 ENCOUNTER — Other Ambulatory Visit: Payer: Self-pay | Admitting: Internal Medicine

## 2018-01-22 DIAGNOSIS — R310 Gross hematuria: Secondary | ICD-10-CM | POA: Diagnosis not present

## 2018-01-22 DIAGNOSIS — R972 Elevated prostate specific antigen [PSA]: Secondary | ICD-10-CM | POA: Diagnosis not present

## 2018-01-22 DIAGNOSIS — N401 Enlarged prostate with lower urinary tract symptoms: Secondary | ICD-10-CM | POA: Diagnosis not present

## 2018-01-22 DIAGNOSIS — R3912 Poor urinary stream: Secondary | ICD-10-CM | POA: Diagnosis not present

## 2018-01-24 ENCOUNTER — Other Ambulatory Visit: Payer: Self-pay | Admitting: Internal Medicine

## 2018-02-05 ENCOUNTER — Other Ambulatory Visit (HOSPITAL_BASED_OUTPATIENT_CLINIC_OR_DEPARTMENT_OTHER): Payer: Self-pay

## 2018-02-05 DIAGNOSIS — G4733 Obstructive sleep apnea (adult) (pediatric): Secondary | ICD-10-CM

## 2018-02-25 DIAGNOSIS — Z23 Encounter for immunization: Secondary | ICD-10-CM | POA: Diagnosis not present

## 2018-02-25 DIAGNOSIS — D1801 Hemangioma of skin and subcutaneous tissue: Secondary | ICD-10-CM | POA: Diagnosis not present

## 2018-02-25 DIAGNOSIS — D485 Neoplasm of uncertain behavior of skin: Secondary | ICD-10-CM | POA: Diagnosis not present

## 2018-02-25 DIAGNOSIS — L820 Inflamed seborrheic keratosis: Secondary | ICD-10-CM | POA: Diagnosis not present

## 2018-03-01 ENCOUNTER — Other Ambulatory Visit: Payer: Self-pay | Admitting: Internal Medicine

## 2018-03-06 ENCOUNTER — Ambulatory Visit (INDEPENDENT_AMBULATORY_CARE_PROVIDER_SITE_OTHER): Payer: 59 | Admitting: *Deleted

## 2018-03-06 DIAGNOSIS — I495 Sick sinus syndrome: Secondary | ICD-10-CM | POA: Diagnosis not present

## 2018-03-06 LAB — CUP PACEART REMOTE DEVICE CHECK
Battery Impedance: 2134 Ohm
Battery Remaining Longevity: 29 mo
Battery Voltage: 2.75 V
Brady Statistic AP VS Percent: 85 %
Brady Statistic AS VP Percent: 1 %
Brady Statistic AS VS Percent: 3 %
Date Time Interrogation Session: 20200305141010
Implantable Lead Implant Date: 20090316
Implantable Lead Implant Date: 20090316
Implantable Lead Location: 753859
Implantable Lead Location: 753860
Implantable Lead Model: 5076
Implantable Lead Model: 5076
Implantable Pulse Generator Implant Date: 20090316
Lead Channel Impedance Value: 559 Ohm
Lead Channel Pacing Threshold Amplitude: 0.75 V
Lead Channel Pacing Threshold Pulse Width: 0.4 ms
Lead Channel Pacing Threshold Pulse Width: 0.4 ms
Lead Channel Setting Pacing Amplitude: 2 V
Lead Channel Setting Pacing Amplitude: 2.5 V
Lead Channel Setting Pacing Pulse Width: 0.4 ms
Lead Channel Setting Sensing Sensitivity: 5.6 mV
MDC IDC MSMT LEADCHNL RA IMPEDANCE VALUE: 375 Ohm
MDC IDC MSMT LEADCHNL RA PACING THRESHOLD AMPLITUDE: 0.5 V
MDC IDC STAT BRADY AP VP PERCENT: 11 %

## 2018-03-10 ENCOUNTER — Other Ambulatory Visit (HOSPITAL_BASED_OUTPATIENT_CLINIC_OR_DEPARTMENT_OTHER): Payer: Self-pay

## 2018-03-10 DIAGNOSIS — G4733 Obstructive sleep apnea (adult) (pediatric): Secondary | ICD-10-CM

## 2018-03-13 ENCOUNTER — Encounter (HOSPITAL_BASED_OUTPATIENT_CLINIC_OR_DEPARTMENT_OTHER): Payer: 59

## 2018-03-14 ENCOUNTER — Encounter: Payer: Self-pay | Admitting: Cardiology

## 2018-03-14 NOTE — Progress Notes (Signed)
Remote pacemaker transmission.   

## 2018-03-18 ENCOUNTER — Telehealth: Payer: Self-pay | Admitting: *Deleted

## 2018-03-18 NOTE — Telephone Encounter (Signed)
Follow up  ° ° °Patient is returning call.  °

## 2018-03-18 NOTE — Telephone Encounter (Signed)
Spoke with patient re: upcoming appointment. He is in agreement to postpone appt to help avoid potential exposure to COVID 19. Aware we will call him to reschedule for June/July. Currently feeling his usual state.  Will call if experiences any new symptoms.

## 2018-03-18 NOTE — Telephone Encounter (Signed)
Left message for patient to call back re: appt next week.

## 2018-03-24 ENCOUNTER — Ambulatory Visit: Payer: 59 | Admitting: Internal Medicine

## 2018-04-01 IMAGING — DX DG CHEST 2V
2 series · 2 of 2 positions shown · non-contrast
Comparison: 07/28/2015

CLINICAL DATA: Shortness of Breath

EXAM:
CHEST  2 VIEW

[w chest pa]
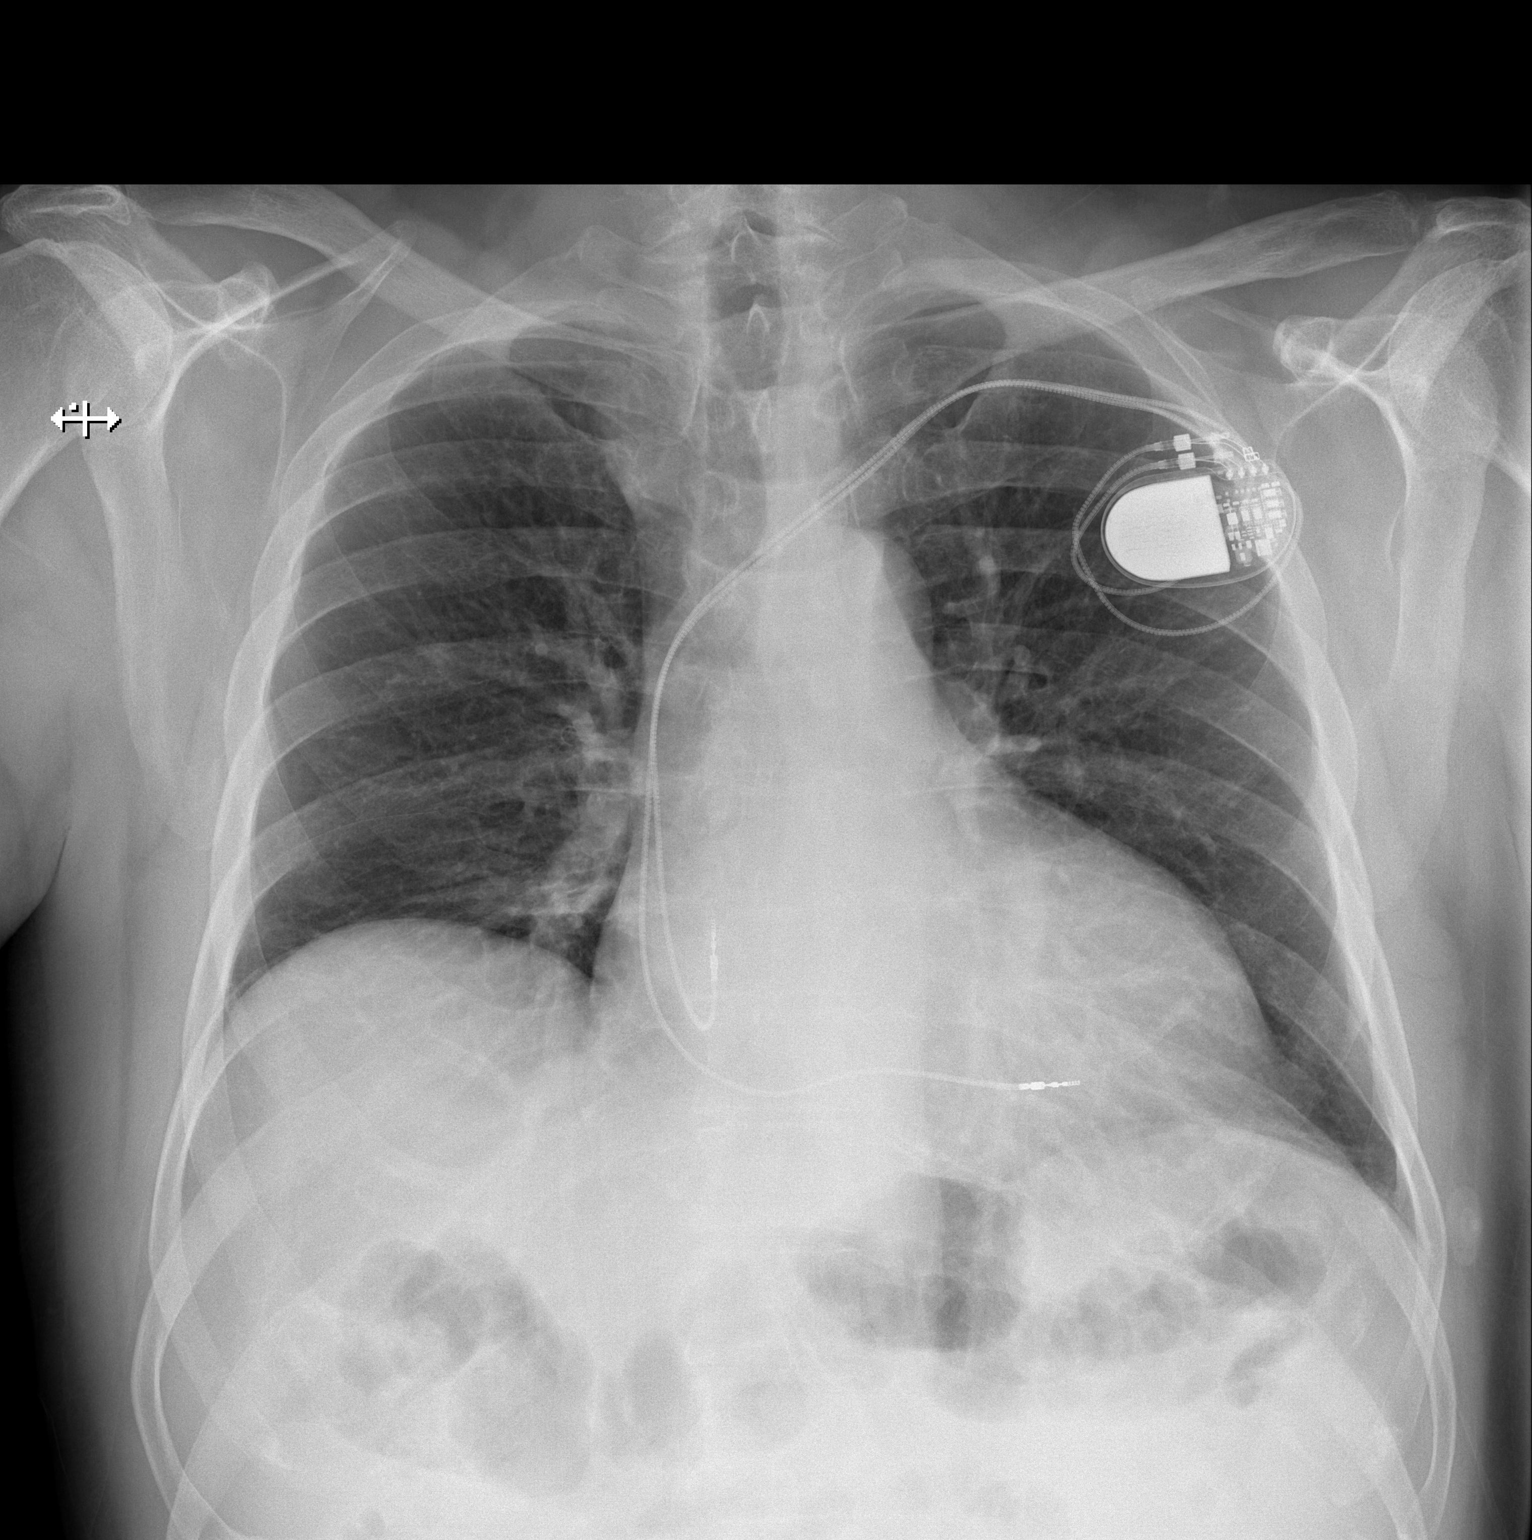

[w chest lat]
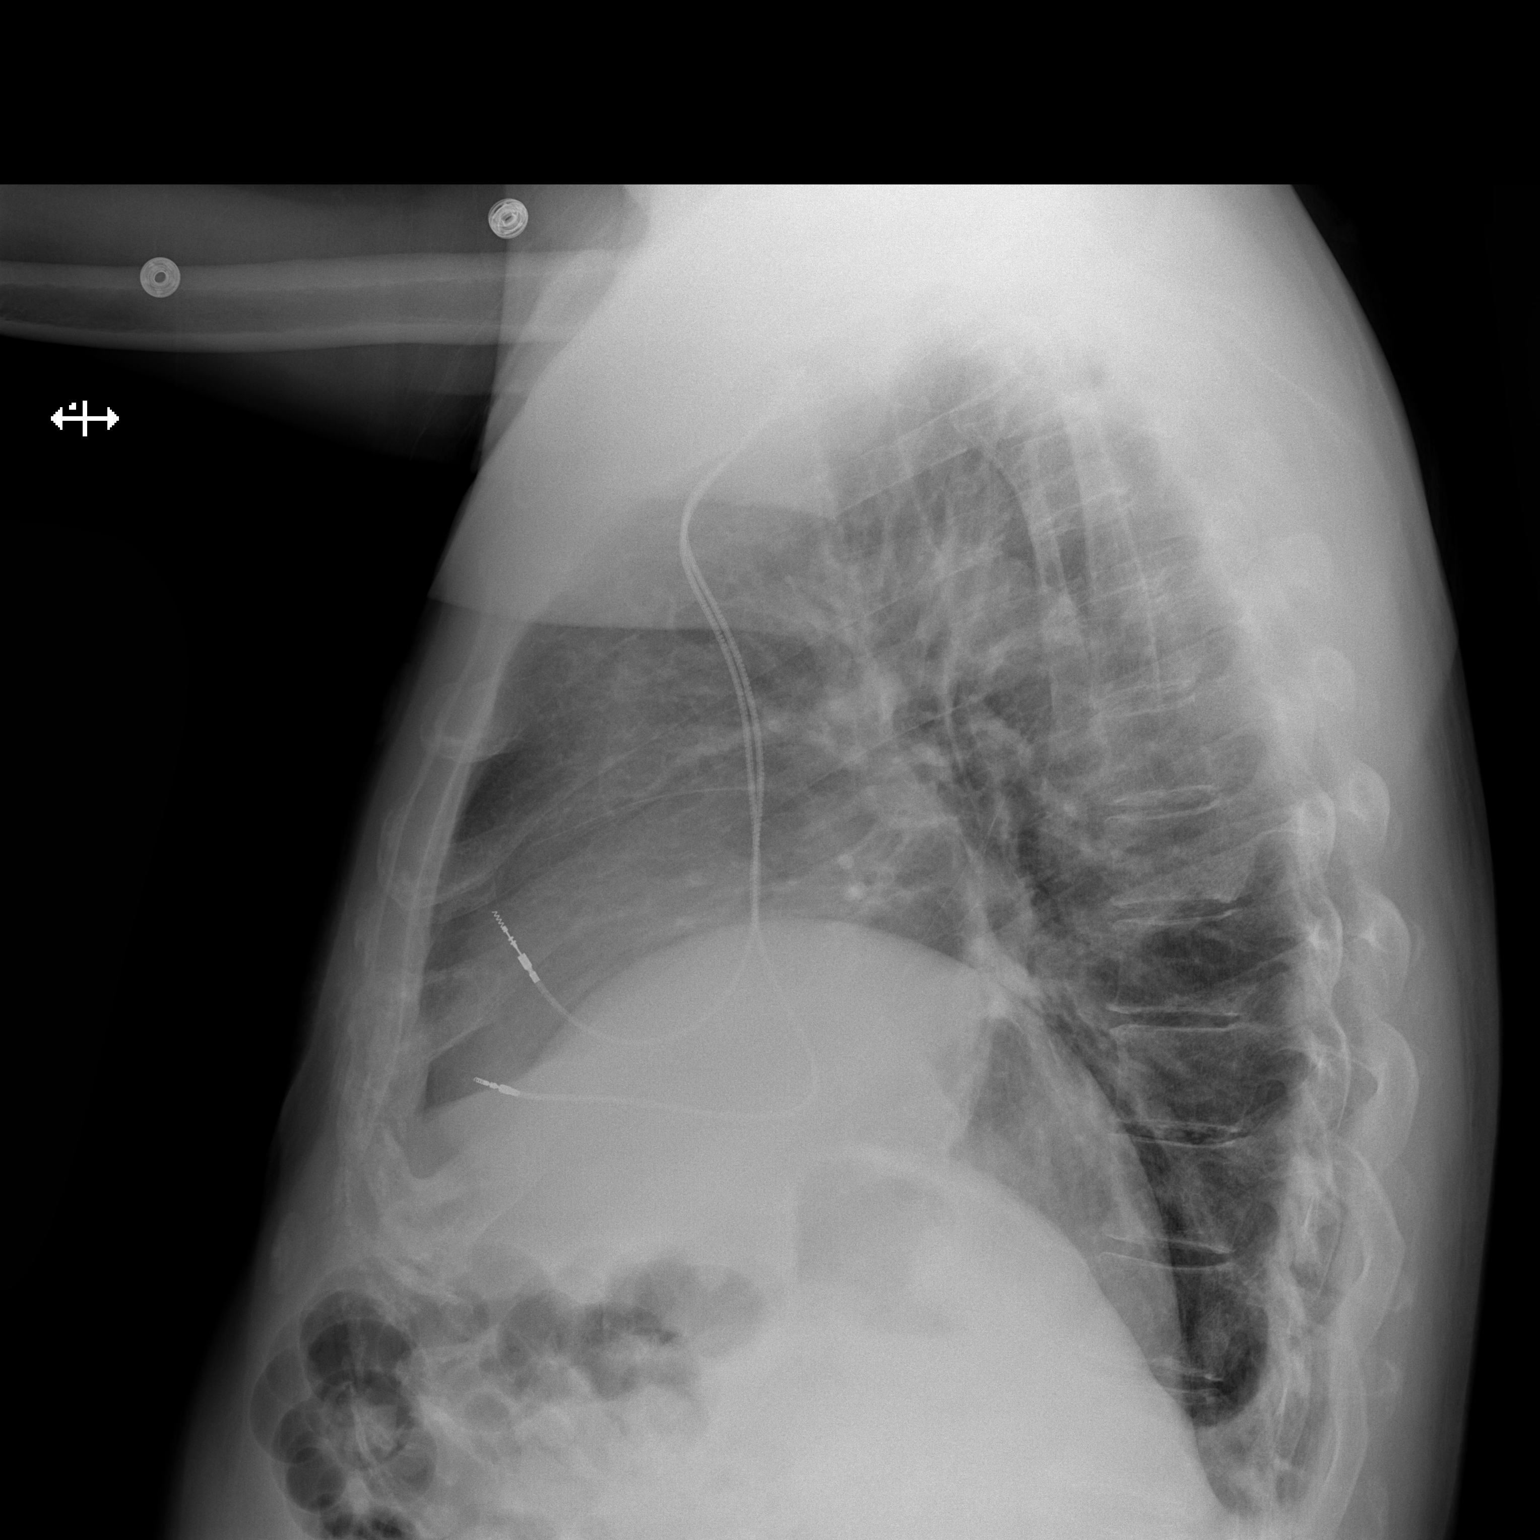

[2 of 2 positions shown; findings below may reference images not displayed]

FINDINGS: Cardiac shadow is mildly enlarged. Pacing device is noted. Lungs are
clear bilaterally. No bony abnormality is seen.
IMPRESSION: No active cardiopulmonary disease.

## 2018-05-01 ENCOUNTER — Encounter (HOSPITAL_BASED_OUTPATIENT_CLINIC_OR_DEPARTMENT_OTHER): Payer: 59

## 2018-05-13 ENCOUNTER — Other Ambulatory Visit: Payer: Self-pay | Admitting: Internal Medicine

## 2018-06-05 ENCOUNTER — Ambulatory Visit (INDEPENDENT_AMBULATORY_CARE_PROVIDER_SITE_OTHER): Payer: 59 | Admitting: *Deleted

## 2018-06-05 DIAGNOSIS — R001 Bradycardia, unspecified: Secondary | ICD-10-CM

## 2018-06-05 DIAGNOSIS — I495 Sick sinus syndrome: Secondary | ICD-10-CM

## 2018-06-06 LAB — CUP PACEART REMOTE DEVICE CHECK
Battery Impedance: 2083 Ohm
Battery Remaining Longevity: 30 mo
Battery Voltage: 2.75 V
Brady Statistic AP VP Percent: 12 %
Brady Statistic AP VS Percent: 85 %
Brady Statistic AS VP Percent: 1 %
Brady Statistic AS VS Percent: 3 %
Date Time Interrogation Session: 20200605120353
Implantable Lead Implant Date: 20090316
Implantable Lead Implant Date: 20090316
Implantable Lead Location: 753859
Implantable Lead Location: 753860
Implantable Lead Model: 5076
Implantable Lead Model: 5076
Implantable Pulse Generator Implant Date: 20090316
Lead Channel Impedance Value: 404 Ohm
Lead Channel Impedance Value: 470 Ohm
Lead Channel Pacing Threshold Amplitude: 0.5 V
Lead Channel Pacing Threshold Amplitude: 0.75 V
Lead Channel Pacing Threshold Pulse Width: 0.4 ms
Lead Channel Pacing Threshold Pulse Width: 0.4 ms
Lead Channel Setting Pacing Amplitude: 2 V
Lead Channel Setting Pacing Amplitude: 2.5 V
Lead Channel Setting Pacing Pulse Width: 0.4 ms
Lead Channel Setting Sensing Sensitivity: 4 mV

## 2018-06-12 NOTE — Progress Notes (Signed)
Remote pacemaker transmission.   

## 2018-08-13 ENCOUNTER — Other Ambulatory Visit: Payer: Self-pay | Admitting: Internal Medicine

## 2018-08-27 ENCOUNTER — Other Ambulatory Visit: Payer: Self-pay | Admitting: Internal Medicine

## 2018-09-04 ENCOUNTER — Ambulatory Visit (INDEPENDENT_AMBULATORY_CARE_PROVIDER_SITE_OTHER): Payer: 59 | Admitting: *Deleted

## 2018-09-04 DIAGNOSIS — I495 Sick sinus syndrome: Secondary | ICD-10-CM | POA: Diagnosis not present

## 2018-09-04 LAB — CUP PACEART REMOTE DEVICE CHECK
Battery Impedance: 2212 Ohm
Battery Remaining Longevity: 28 mo
Battery Voltage: 2.74 V
Brady Statistic AP VP Percent: 12 %
Brady Statistic AP VS Percent: 84 %
Brady Statistic AS VP Percent: 1 %
Brady Statistic AS VS Percent: 2 %
Date Time Interrogation Session: 20200903124740
Implantable Lead Implant Date: 20090316
Implantable Lead Implant Date: 20090316
Implantable Lead Location: 753859
Implantable Lead Location: 753860
Implantable Lead Model: 5076
Implantable Lead Model: 5076
Implantable Pulse Generator Implant Date: 20090316
Lead Channel Impedance Value: 371 Ohm
Lead Channel Impedance Value: 564 Ohm
Lead Channel Pacing Threshold Amplitude: 0.5 V
Lead Channel Pacing Threshold Amplitude: 0.625 V
Lead Channel Pacing Threshold Pulse Width: 0.4 ms
Lead Channel Pacing Threshold Pulse Width: 0.4 ms
Lead Channel Setting Pacing Amplitude: 2 V
Lead Channel Setting Pacing Amplitude: 2.5 V
Lead Channel Setting Pacing Pulse Width: 0.4 ms
Lead Channel Setting Sensing Sensitivity: 4 mV

## 2018-09-15 ENCOUNTER — Other Ambulatory Visit: Payer: Self-pay

## 2018-09-15 ENCOUNTER — Encounter: Payer: Self-pay | Admitting: Internal Medicine

## 2018-09-15 ENCOUNTER — Ambulatory Visit (INDEPENDENT_AMBULATORY_CARE_PROVIDER_SITE_OTHER): Payer: 59 | Admitting: Internal Medicine

## 2018-09-15 VITALS — BP 124/60 | HR 79 | Ht 72.0 in | Wt 211.2 lb

## 2018-09-15 DIAGNOSIS — I5022 Chronic systolic (congestive) heart failure: Secondary | ICD-10-CM

## 2018-09-15 DIAGNOSIS — Z79899 Other long term (current) drug therapy: Secondary | ICD-10-CM | POA: Diagnosis not present

## 2018-09-15 DIAGNOSIS — R3916 Straining to void: Secondary | ICD-10-CM | POA: Diagnosis not present

## 2018-09-15 DIAGNOSIS — I428 Other cardiomyopathies: Secondary | ICD-10-CM | POA: Diagnosis not present

## 2018-09-15 DIAGNOSIS — N401 Enlarged prostate with lower urinary tract symptoms: Secondary | ICD-10-CM | POA: Diagnosis not present

## 2018-09-15 NOTE — Patient Instructions (Signed)
Medication Instructions:  No changes If you need a refill on your cardiac medications before your next appointment, please call your pharmacy.   Lab work: Today: cmet, tsh, lipids, cbc, bnp, psa If you have labs (blood work) drawn today and your tests are completely normal, you will receive your results only by: Marland Kitchen MyChart Message (if you have MyChart) OR . A paper copy in the mail If you have any lab test that is abnormal or we need to change your treatment, we will call you to review the results.  Testing/Procedures: none  Follow-Up: At [facility], you and your health needs are our priority.  As part of our continuing mission to provide you with exceptional heart care, we have created designated Provider Care Teams.  These Care Teams include your primary Cardiologist (physician) and Advanced Practice Providers (APPs -  Physician Assistants and Nurse Practitioners) who all work together to provide you with the care you need, when you need it. You will need a follow up appointment in:  12 months.  Please call our office 2 months in advance to schedule this appointment.  You may see Dorris Carnes, MD or one of the following Advanced Practice Providers on your designated Care Team: Richardson Dopp, PA-C [place], [place] . Daune Perch, NP  Any Other Special Instructions Will Be Listed Below (If Applicable).

## 2018-09-15 NOTE — Progress Notes (Signed)
Cardiology Office Note   Date:  09/15/2018   ID:  Patrick Weaver, DOB 1945/09/12, MRN [id number]  PCP:  Vernie Shanks, MD  Cardiologist:   Dorris Carnes, MD   Follow up of chronic systolic CHF      History of Present Illness: Patrick Weaver is a 73 y.o. male with a history of PVCs  Rx flecanide in the past   LHC in 2014 showed no signif CAD  EF normal   Pt also has history of  aortic insuffic  CT in 07/2015 stable aorta of 42 mm  LVEF on echo was 25 to 30%   This was signif down from previous  AI was mild to moderate.   Interrogation of pacer showed freq PVCs  Flecanide discontinued and pt placed on amiodarone  This was successful in suppressing PVCs  But repeat echo in the fall 2017 showed no improvement in the LVEF   Interrogation of pacemaker showed pacing was infrequent   He went on to have another  L heart catheterization in 2018  There was no CAD LVEDP was 21 The pt has been seen by J Colodonato  He was swtched from an ARB to [place].   I lasst saw the pt in 2018  He was seen by Kathleen Argue after and also by [name] , last in 2019   DATE TEST EF   8/15 Echo  65 %   7/17  Echo   25 % LAE (52/2.5/35)  12/17 Echo  25% AI  mild   2/18 Cath  No obstructive CAD  8/18 Echo  20-25%    12/18 CT Aorta  4.5 cm Aneruysm  9/19 Echo  25-30% LAE severe (57/2.6/28)    Date Cr TSH LFTs PFTs  10/17   3.19 29    5/18  1.37 2.47 18 DLCO stable  9/19 1.28 2.72 17       Current Meds  Medication Sig  . acetaminophen (TYLENOL) 325 MG tablet Take 2 tablets (650 mg total) by mouth every 6 (six) hours as needed for mild pain (or Fever >/= 101).  Marland Kitchen amiodarone (PACERONE) 200 MG tablet TAKE 0.5 TABLETS DAILY BY MOUTH.  Marland Kitchen atorvastatin (LIPITOR) 10 MG tablet TAKE 1 TABLET (10 MG TOTAL) BY MOUTH DAILY.  . carvedilol (COREG) 6.25 MG tablet Take 1 tablet (6.25 mg total) by mouth 2 (two) times daily. Please keep upcoming appt in September with Dr. [name] for future refills. Thank you  .  docusate sodium (COLACE) 100 MG capsule Take 100 mg by mouth daily as needed for mild constipation or moderate constipation.  . finasteride (PROSCAR) 5 MG tablet Take 5 mg by mouth daily.  Marland Kitchen gabapentin (NEURONTIN) 300 MG capsule Take 600 mg by mouth 2 (two) times daily.   . sacubitril-valsartan (ENTRESTO) 24-26 MG Take 1 tablet by mouth 2 (two) times daily. Please keep upcoming appt in September with Dr. [name] for future refills. Thank you  . spironolactone (ALDACTONE) 25 MG tablet Take 0.5 tablets (12.5 mg total) by mouth daily.  . tamsulosin (FLOMAX) 0.4 MG CAPS capsule Take 0.4 mg by mouth daily as needed.   . vitamin B-12 (CYANOCOBALAMIN) 1000 MCG tablet Take 1,000 mcg by mouth daily.     Allergies:   Codeine   Past Medical History:  Diagnosis Date  . Aneurysm of thoracic aorta ([place]) 07/06/2008   CT 12/18: Ascending aorta 4.5 cm, aortic root 4.6 cm    . Bladder stone   .  BPH (benign prostatic hyperplasia)   . Bradycardia    a.s/p MDT dual chamber pacemaker  . Chronic systolic heart failure ([place]) 08/13/2015   Echo 10/18: Moderate LVH, EF 25, mild AI, aortic annulus dilated at 53 mm, sinotubular junction is 34 mm, proximal ascending aorta is 39 mm // Echo 12/17: Mild LVH, EF 25-30, diffuse HK, mild AI, mild MR, severe LAE, mild RAE, PASP 42 // Echo 7/17: EF 25-30, severe diffuse HK (disproportionately severe HK of the inferolateral and inferior myocardium), mild to moderate AI, aortic root 44 mm, ascend  . Dyslipidemia    failed niaspan  . History of echocardiogram    Echo 09/26/2017: EF 25-30, normal wall motion, grade 2 diastolic dysfunction, mild AI, severe LAE, mildly reduced RVSF  . HTN (hypertension)   . NICM (nonischemic cardiomyopathy) ([place])    LHC2/18: no angiographic CAD  . PVCs (premature ventricular contractions)    controlled on Amiodarone // Holter 11/17: PVCs 1.8%  . Sleep apnea    USES C-PAP    Past Surgical History:  Procedure Laterality Date  . CYSTOSCOPY WITH  LITHOLAPAXY N/A 07/12/2014   Procedure: CYSTOSCOPY WITH LITHOLAPAXY;  Surgeon: Raynelle Bring, MD;  Location: [facility];  Service: Urology;  Laterality: N/A;  . EP IMPLANTABLE DEVICE  2009  . LEFT HEART CATH AND CORONARY ANGIOGRAPHY N/A 02/15/2016   Procedure: Left Heart Cath and Coronary Angiography;  Surgeon: Burnell Blanks, MD;  Location: [facility];  Service: Cardiovascular;  Laterality: N/A;  . TONSILLECTOMY    . TRANSURETHRAL RESECTION OF PROSTATE N/A 07/12/2014   Procedure: TRANSURETHRAL RESECTION OF THE PROSTATE (TURP);  Surgeon: Raynelle Bring, MD;  Location: [facility];  Service: Urology;  Laterality: N/A;     Social History:  The patient  reports that he has never smoked. He has never used smokeless tobacco. He reports that he does not drink alcohol or use drugs.   Family History:  The patient's family history includes Cancer in his mother; Colon cancer in his maternal grandfather; Congestive Heart Failure in his father; Heart attack in his father; Heart disease in his father; Hypertension in his brother and mother.    ROS:  Please see the history of present illness. All other systems are reviewed and  Negative to the above problem except as noted.    PHYSICAL EXAM: VS:  BP 124/60   Pulse 79   Ht 6' (1.829 m)   Wt 211 lb 3.2 oz (95.8 kg)   BMI 28.64 kg/m   GEN: Well nourished, well developed, in no acute distress  HEENT: normal  Neck: JVP normal No carotid bruits Cardiac: RRR; no murmurs, rubs, or gallops,no edema  Respiratory:  clear to auscultation bilaterally, normal work of breathing GI: soft, nontender, nondistended, + BS  No hepatomegaly  MS: no deformity Moving all extremities   Skin: warm and dry, no rash Neuro:  Strength and sensation are intact Psych: euthymic mood, full affect   EKG:  EKG is not ordered today   Lipid Panel    Component Value Date/Time   CHOL 114 (L) 10/21/2015 0941   TRIG 123 10/21/2015 0941   HDL 31 (L) 10/21/2015 0941    CHOLHDL 3.7 10/21/2015 0941   VLDL 25 10/21/2015 0941   LDLCALC 58 10/21/2015 0941   LDLDIRECT 98.6 05/30/2006 0856      Wt Readings from Last 3 Encounters:  09/15/18 211 lb 3.2 oz (95.8 kg)  10/08/17 213 lb 3.2 oz (96.7 kg)  09/24/17 211 lb  12.8 oz (96.1 kg)      ASSESSMENT AND PLAN:  1  Chronic systolic heart failure  Nonischemic  Pt's volume status is OK   Keep on current regimen   2.  PVCs  Continue amiodarone   3  Hx bradycardia  S/p PPM  Infrequ pacing    4  HTN  BP control good  Contiue meds  5  Aortic aneurysm  Aorta measured 4.4 on last CT  Will need to continue to f/u  6  Renal insuff  Check BMET    7  OSA  Continue CPAP    Current medicines are reviewed at length with the patient today.  The patient does not have concerns regarding medicines.  Signed, Dorris Carnes, MD  09/15/2018 10:40 AM    [address], [place], [place]  [address] Phone: [phone]; Fax: [phone]

## 2018-09-16 LAB — COMPREHENSIVE METABOLIC PANEL
ALT: 11 IU/L (ref 0–44)
AST: 12 IU/L (ref 0–40)
Albumin/Globulin Ratio: 2.4 — ABNORMAL HIGH (ref 1.2–2.2)
Albumin: 4.5 g/dL (ref 3.8–4.8)
Alkaline Phosphatase: 76 IU/L (ref 39–117)
BUN/Creatinine Ratio: 10 (ref 10–24)
BUN: 13 mg/dL (ref 8–27)
Bilirubin Total: 0.7 mg/dL (ref 0.0–1.2)
CO2: 21 mmol/L (ref 20–29)
Calcium: 9.5 mg/dL (ref 8.6–10.2)
Chloride: 103 mmol/L (ref 96–106)
Creatinine, Ser: 1.3 mg/dL — ABNORMAL HIGH (ref 0.76–1.27)
GFR calc Af Amer: 64 mL/min/{1.73_m2} (ref >59)
GFR calc non Af Amer: 55 mL/min/{1.73_m2} — ABNORMAL LOW (ref >59)
Globulin, Total: 1.9 g/dL (ref 1.5–4.5)
Glucose: 97 mg/dL (ref 65–99)
Potassium: 4.2 mmol/L (ref 3.5–5.2)
Sodium: 139 mmol/L (ref 134–144)
Total Protein: 6.4 g/dL (ref 6.0–8.5)

## 2018-09-16 LAB — PSA: Prostate Specific Ag, Serum: 1.3 ng/mL (ref 0.0–4.0)

## 2018-09-16 LAB — TSH: TSH: 4.97 u[IU]/mL — ABNORMAL HIGH (ref 0.450–4.500)

## 2018-09-16 LAB — LIPID PANEL
Chol/HDL Ratio: 3.6 ratio (ref 0.0–5.0)
Cholesterol, Total: 102 mg/dL (ref 100–199)
HDL: 28 mg/dL — ABNORMAL LOW (ref >39)
LDL Chol Calc (NIH): 51 mg/dL (ref 0–99)
Triglycerides: 131 mg/dL (ref 0–149)
VLDL Cholesterol Cal: 23 mg/dL (ref 5–40)

## 2018-09-16 LAB — CBC
Hematocrit: 43.8 % (ref 37.5–51.0)
Hemoglobin: 15 g/dL (ref 13.0–17.7)
MCH: 31.1 pg (ref 26.6–33.0)
MCHC: 34.2 g/dL (ref 31.5–35.7)
MCV: 91 fL (ref 79–97)
Platelets: 132 10*3/uL — ABNORMAL LOW (ref 150–450)
RBC: 4.82 x10E6/uL (ref 4.14–5.80)
RDW: 13.3 % (ref 11.6–15.4)
WBC: 5.9 10*3/uL (ref 3.4–10.8)

## 2018-09-16 LAB — PRO B NATRIURETIC PEPTIDE: NT-Pro BNP: 1084 pg/mL — ABNORMAL HIGH (ref 0–376)

## 2018-09-17 ENCOUNTER — Telehealth: Payer: Self-pay | Admitting: *Deleted

## 2018-09-17 ENCOUNTER — Encounter: Payer: Self-pay | Admitting: Cardiology

## 2018-09-17 DIAGNOSIS — R7989 Other specified abnormal findings of blood chemistry: Secondary | ICD-10-CM

## 2018-09-17 DIAGNOSIS — Z79899 Other long term (current) drug therapy: Secondary | ICD-10-CM

## 2018-09-17 NOTE — Telephone Encounter (Signed)
I spoke with pt and reviewed results with him.  Message also sent through my chart. Pt will come in for lab work on December 16,2020 at 9:30

## 2018-09-17 NOTE — Telephone Encounter (Signed)
-----   Message from Fay Records, MD sent at 09/17/2018  2:11 PM EDT ----- Electrolytes are OK   Kidney function is stable   Liver function normal  Thyroid stimulating hormone is minimally elevated.   THyroid function is probably normal   Would f/u in about 3 months with free T3, Free T4, TSH Lipids:   LDL is excellent   HDL is rel stable at 28    CBC is OK   Platlets are stable  FLuid is up some  Watch salt PSA (prostate) is normal

## 2018-09-17 NOTE — Progress Notes (Signed)
Remote pacemaker transmission.   

## 2018-09-18 ENCOUNTER — Other Ambulatory Visit: Payer: Self-pay | Admitting: Internal Medicine

## 2018-09-26 DIAGNOSIS — I351 Nonrheumatic aortic (valve) insufficiency: Secondary | ICD-10-CM | POA: Diagnosis not present

## 2018-09-26 DIAGNOSIS — R310 Gross hematuria: Secondary | ICD-10-CM | POA: Diagnosis not present

## 2018-09-26 DIAGNOSIS — E785 Hyperlipidemia, unspecified: Secondary | ICD-10-CM | POA: Diagnosis not present

## 2018-09-26 DIAGNOSIS — I429 Cardiomyopathy, unspecified: Secondary | ICD-10-CM | POA: Diagnosis not present

## 2018-09-26 DIAGNOSIS — I129 Hypertensive chronic kidney disease with stage 1 through stage 4 chronic kidney disease, or unspecified chronic kidney disease: Secondary | ICD-10-CM | POA: Diagnosis not present

## 2018-09-26 DIAGNOSIS — Z23 Encounter for immunization: Secondary | ICD-10-CM | POA: Diagnosis not present

## 2018-09-26 DIAGNOSIS — I712 Thoracic aortic aneurysm, without rupture: Secondary | ICD-10-CM | POA: Diagnosis not present

## 2018-09-26 DIAGNOSIS — Z1159 Encounter for screening for other viral diseases: Secondary | ICD-10-CM | POA: Diagnosis not present

## 2018-09-26 DIAGNOSIS — R768 Other specified abnormal immunological findings in serum: Secondary | ICD-10-CM | POA: Diagnosis not present

## 2018-09-26 DIAGNOSIS — G4730 Sleep apnea, unspecified: Secondary | ICD-10-CM | POA: Diagnosis not present

## 2018-09-26 DIAGNOSIS — Z9989 Dependence on other enabling machines and devices: Secondary | ICD-10-CM | POA: Diagnosis not present

## 2018-09-26 DIAGNOSIS — N183 Chronic kidney disease, stage 3 (moderate): Secondary | ICD-10-CM | POA: Diagnosis not present

## 2018-10-14 ENCOUNTER — Other Ambulatory Visit: Payer: Self-pay | Admitting: Internal Medicine

## 2018-10-14 MED ORDER — SPIRONOLACTONE 25 MG PO TABS: 12.5000 mg | ORAL_TABLET | Freq: Every day | ORAL | 0 refills | Status: DC

## 2018-10-20 ENCOUNTER — Other Ambulatory Visit: Payer: Self-pay | Admitting: Physician Assistant

## 2018-10-20 ENCOUNTER — Other Ambulatory Visit: Payer: Self-pay | Admitting: Internal Medicine

## 2018-10-29 NOTE — Progress Notes (Signed)
Cardiology Office Note Date:  10/30/2018  Patient ID:  Patrick Weaver, Patrick Weaver 05-13-1945, MRN [id number] PCP:  Vernie Shanks, MD  Cardiologist:  Dr. [name] Electrophysiologist: Dr. Caryl Comes   Chief Complaint:  annual visit  History of Present Illness: Patrick Weaver is a 73 y.o. male with history of NICM, chronic CHF, bradycardia w/PPM, PVCs on amiodarone, CKD (III), HTN, HLDM ascending aortic aneurysm (monitored by Dr. [name]), OSA w/CPAP.  He comes in today to be seen for Dr. Caryl Comes.  Last seen by him oct 2019, at that time, PVC burden was 3%, amio was continued.  He addressed the question of an ICD; mentioned, based on [place], given his age and nonischemic myopathy, it would not upgrade his device.  His ADL slope was adjusted for better HR response.  Most recently she was seen by Dr. [name] 09/2018, was doing well, no changes were made.  He feels like he is doing well.  Mentions he never really has had a problem with water retention. He denies any CP, or SOB.  He will feel palpitations when at rest, still, otherwise unoccupied.  Not aware of them when busy.  No dizziness, near syncope or syncope. He has sleep apnea and mentions he has been waiting for the opportunity to get to his sleep specialist to update his sleep test.  He says historically he required BIPAP, but his insurance company would not cover it, and he does wake feeling like his CPAP is not enough.  Not every not though often enough he would like to be reconsidered for BIPAP. He denies exertional intolerances or DOE, when asked if he feels SOB laying on the couch supine, or otherwise, he says no, and again, mentions he doesn't feel like he is retaining fluid.  His BNP last month was elevated, Dr. [name] counseled on sodium reduction  PVCs AAD Hx flecainide >> pt developed CM and required stopping drug Amiodarone started, 2017 >> current  Past Medical History:  Diagnosis Date  . Aneurysm of thoracic aorta ([place]) 07/06/2008   CT  12/18: Ascending aorta 4.5 cm, aortic root 4.6 cm    . Bladder stone   . BPH (benign prostatic hyperplasia)   . Bradycardia    a.s/p MDT dual chamber pacemaker  . Chronic systolic heart failure ([place]) 08/13/2015   Echo 10/18: Moderate LVH, EF 25, mild AI, aortic annulus dilated at 53 mm, sinotubular junction is 34 mm, proximal ascending aorta is 39 mm // Echo 12/17: Mild LVH, EF 25-30, diffuse HK, mild AI, mild MR, severe LAE, mild RAE, PASP 42 // Echo 7/17: EF 25-30, severe diffuse HK (disproportionately severe HK of the inferolateral and inferior myocardium), mild to moderate AI, aortic root 44 mm, ascend  . Dyslipidemia    failed niaspan  . History of echocardiogram    Echo 09/26/2017: EF 25-30, normal wall motion, grade 2 diastolic dysfunction, mild AI, severe LAE, mildly reduced RVSF  . HTN (hypertension)   . NICM (nonischemic cardiomyopathy) ([place])    LHC2/18: no angiographic CAD  . PVCs (premature ventricular contractions)    controlled on Amiodarone // Holter 11/17: PVCs 1.8%  . Sleep apnea    USES C-PAP    Past Surgical History:  Procedure Laterality Date  . CYSTOSCOPY WITH LITHOLAPAXY N/A 07/12/2014   Procedure: CYSTOSCOPY WITH LITHOLAPAXY;  Surgeon: Raynelle Bring, MD;  [facility];  Service: Urology;  Laterality: N/A;  . EP IMPLANTABLE DEVICE  2009  . LEFT HEART CATH AND CORONARY ANGIOGRAPHY N/A  02/15/2016   Procedure: Left Heart Cath and Coronary Angiography;  Surgeon: Burnell Blanks, MD;  Location: [facility];  Service: Cardiovascular;  Laterality: N/A;  . TONSILLECTOMY    . TRANSURETHRAL RESECTION OF PROSTATE N/A 07/12/2014   Procedure: TRANSURETHRAL RESECTION OF THE PROSTATE (TURP);  Surgeon: Raynelle Bring, MD;  [facility];  Service: Urology;  Laterality: N/A;    Current Outpatient Medications  Medication Sig Dispense Refill  . acetaminophen (TYLENOL) 325 MG tablet Take 2 tablets (650 mg total) by mouth every 6 (six) hours as needed for mild pain  (or Fever >/= 101). 30 tablet 0  . amiodarone (PACERONE) 200 MG tablet Take 1 tablet (200 mg total) by mouth daily. 90 tablet 3  . atorvastatin (LIPITOR) 10 MG tablet TAKE 1 TABLET BY MOUTH EVERY DAY 90 tablet 3  . carvedilol (COREG) 6.25 MG tablet Take 1 tablet (6.25 mg total) by mouth 2 (two) times daily. 180 tablet 2  . docusate sodium (COLACE) 100 MG capsule Take 100 mg by mouth daily as needed for mild constipation or moderate constipation.    . finasteride (PROSCAR) 5 MG tablet Take 5 mg by mouth daily.  11  . gabapentin (NEURONTIN) 300 MG capsule Take 600 mg by mouth 2 (two) times daily.     . sacubitril-valsartan (ENTRESTO) 24-26 MG Take 1 tablet by mouth 2 (two) times daily. 180 tablet 2  . spironolactone (ALDACTONE) 25 MG tablet Take 0.5 tablets (12.5 mg total) by mouth daily. Please make overdue appt with Dr. Caryl Comes before anymore refills. 1st attempt 15 tablet 0  . tamsulosin (FLOMAX) 0.4 MG CAPS capsule Take 0.4 mg by mouth daily as needed.     . vitamin B-12 (CYANOCOBALAMIN) 1000 MCG tablet Take 1,000 mcg by mouth daily.     No current facility-administered medications for this visit.     Allergies:   Codeine   Social History:  The patient  reports that he has never smoked. He has never used smokeless tobacco. He reports that he does not drink alcohol or use drugs.   Family History:  The patient's family history includes Cancer in his mother; Colon cancer in his maternal grandfather; Congestive Heart Failure in his father; Heart attack in his father; Heart disease in his father; Hypertension in his brother and mother.  ROS:  Please see the history of present illness.    All other systems are reviewed and otherwise negative.   PHYSICAL EXAM:  VS:  BP 114/70   Pulse 89   Ht 6' (1.829 m)   Wt 215 lb (97.5 kg)   BMI 29.16 kg/m  BMI: Body mass index is 29.16 kg/m. Well nourished, well developed, in no acute distress  HEENT: normocephalic, atraumatic  Neck: no JVD, carotid  bruits or masses Cardiac:  RRR; no significant murmurs, no rubs, or gallops Lungs:  CTA b/l, no wheezing, rhonchi or rales  Abd: soft, nontender MS: no deformity or atrophy Ext: no edema  Skin: warm and dry, no rash Neuro:  No gross deficits appreciated Psych: euthymic mood, full affect  PPM site is stable, no tethering or discomfort   EKG:  Not done today PPM interrogation done today and reviewed by myself:  Battery and lead measurements are good AP 100% VP 13% 2 HVR, NSVT, 6 beats and 26beats (this was 07/02/2018) One HAR, looks like AFib, 1 minute 6sec duration on 06/05/18    09/26/17: TTE Study Conclusions - Left ventricle: The cavity size was mildly dilated.  Wall   thickness was normal. Systolic function was severely reduced. The   estimated ejection fraction was in the range of 25% to 30%. Wall   motion was normal; there were no regional wall motion   abnormalities. There was a reduced contribution of atrial   contraction to ventricular filling, due to increased ventricular   diastolic pressure or atrial contractile dysfunction. Features   are consistent with a pseudonormal left ventricular filling   pattern, with concomitant abnormal relaxation and increased   filling pressure (grade 2 diastolic dysfunction). - Ventricular septum: Septal motion showed abnormal function,   dyssynergy, and paradox. - Aortic valve: There was mild regurgitation. - Left atrium: The atrium was severely dilated. - Right ventricle: The cavity size was mildly dilated. Systolic   function was mildly reduced.   Chest CTA 12/04/2016 IMPRESSION: 1. Stable aneurysmal disease of the aortic root and ascending thoracic aorta. Measurements are stable since 2011 with maximal diameter of the ascending thoracic aorta of 4.5 cm. Aortic root at the level of the sinuses of Valsalva measures approximately 4.6 cm. 2. Stable scattered tiny nodules in the right upper lobe. 3. Stable scattered small hepatic  cysts and hepatic steatosis.  Echo 10/08/2016 Moderate LVH, EF 25, mild AI, aortic annulus dilated at 53 mm, sinotubular junction is 34 mm, proximal ascending aorta is 39 mm  Cardiac catheterization 02/15/2016 1. No angiographic evidence of CAD 2. Non-ischemic cardiomyopathy  Echo 12/02/2015 Mild LVH, EF 25-30, diffuse HK, mild AI, mild MR, severe LAE, mild RAE, PASP 42  Holter 11/30/2015 SR 61 to 138 bpm Average HR 72 bpm Occasional PVC (1.8% total beats)  Echo 07/22/2015 EF 25-30, severe diffuse HK (disproportionately severe HK of the inferolateral and inferior myocardium), mild to moderate AI, aortic root 44 mm, ascending aorta 40 mm, severe LAE, mild RAE, PASP 36  Recent Labs: 09/15/2018: ALT 11; BUN 13; Creatinine, Ser 1.30; Hemoglobin 15.0; NT-Pro BNP 1,084; Platelets 132; Potassium 4.2; Sodium 139; TSH 4.970  09/15/2018: Chol/HDL Ratio 3.6; Cholesterol, Total 102; HDL 28; LDL Chol Calc (NIH) 51; Triglycerides 131   CrCl cannot be calculated (Patient's most recent lab result is older than the maximum 21 days allowed.).   Wt Readings from Last 3 Encounters:  10/30/18 215 lb (97.5 kg)  09/15/18 211 lb 3.2 oz (95.8 kg)  10/08/17 213 lb 3.2 oz (96.7 kg)     Other studies reviewed: Additional studies/records reviewed today include: summarized above  ASSESSMENT AND PLAN:  1. PPM     intact function     2. PVCs     Burden is up from last year 10/2017-10/2018 had [id number] 04/2017-10/2017 had [id number]  He is on 100mg  daily of his amiodarone I will have him take full tab, 200mg  daily His TSH slightly elevated last month, LFTs were wnl Getting f/u thyroid profile in Dec already, will add on LFTs to these lasb  He is pending to see Dr. Isidoro Donning, his sleep specialist He is asked to follow up and I ask PFTs be done as well.  If unable to do PFTs with him, will have him scheduled through [place].  (he tells me he has had several historically all have been ok as far as he knows  (I cant see any in epic)   3. NICM 4. Chronic CHf (systolic)     Dr. Caryl Comes did not think device upgrade was indicated       He has some nighttime SOB that he is waking with that he thinks is  insufficient CPAP therapy      He does not have exertional symptoms, his exam today does not suggest fluid OL.      BNP was elevated last month..he is re-counsled on ,ow salt and daily weights   Disposition: F/u with labs in Dec as above, I will see him back in clinic, hopefully PVCs are down.  Continue Q 3 mo remotes.  Current medicines are reviewed at length with the patient today.  The patient did not have any concerns regarding medicines  Signed, Tommye Standard, PA-C 10/30/2018 10:53 AM     [facility] [address] [place] [address] [phone] (office)  [phone] (fax)

## 2018-10-30 ENCOUNTER — Ambulatory Visit (INDEPENDENT_AMBULATORY_CARE_PROVIDER_SITE_OTHER): Payer: 59 | Admitting: Physician Assistant

## 2018-10-30 ENCOUNTER — Other Ambulatory Visit: Payer: Self-pay

## 2018-10-30 VITALS — BP 114/70 | HR 89 | Ht 72.0 in | Wt 215.0 lb

## 2018-10-30 DIAGNOSIS — I5022 Chronic systolic (congestive) heart failure: Secondary | ICD-10-CM

## 2018-10-30 DIAGNOSIS — Z950 Presence of cardiac pacemaker: Secondary | ICD-10-CM | POA: Diagnosis not present

## 2018-10-30 DIAGNOSIS — I428 Other cardiomyopathies: Secondary | ICD-10-CM | POA: Diagnosis not present

## 2018-10-30 DIAGNOSIS — I493 Ventricular premature depolarization: Secondary | ICD-10-CM | POA: Diagnosis not present

## 2018-10-30 MED ORDER — AMIODARONE HCL 200 MG PO TABS: 200.0000 mg | ORAL_TABLET | Freq: Every day | ORAL | 3 refills | Status: DC

## 2018-10-30 NOTE — Patient Instructions (Addendum)
Medication Instructions:  START TAKING : AMIODARONE 200 MG ONCE A DAY   *If you need a refill on your cardiac medications before your next appointment, please call your pharmacy*  Lab Work:   RETURN FOR  LABS  TSH AND LFT IN DEC.  If you have labs (blood work) drawn today and your tests are completely normal, you will receive your results only by: Marland Kitchen MyChart Message (if you have MyChart) OR . A paper copy in the mail If you have any lab test that is abnormal or we need to change your treatment, we will call you to review the results.  Testing/Procedures: NONE ORDERED  TODAY   Follow-Up: At [facility], you and your health needs are our priority.  As part of our continuing mission to provide you with exceptional heart care, we have created designated Provider Care Teams.  These Care Teams include your primary Cardiologist (physician) and Advanced Practice Providers (APPs -  Physician Assistants and Nurse Practitioners) who all work together to provide you with the care you need, when you need it.  Your next appointment:   6 months  The format for your next appointment:   In Person  Provider:  Tommye Standard, PA-C   Other Instructions

## 2018-11-05 ENCOUNTER — Telehealth: Payer: Self-pay | Admitting: Internal Medicine

## 2018-11-05 NOTE — Telephone Encounter (Signed)
Reviewed transmission.  AP/VS, since 10/29 16,778 PVCs. Reviewed w/ Tommye Standard, PA who saw pt last week and increase Amiodarone. Pt advised to continue to monitor and give Amiodarone more time to take effect. Explained rationale behind this and how Amiodarone can take time to get into his system and take effect. Also advised to follow up with his sleep specialist, per Renee. Rationale also given to pt as to the importance of apnea control. Patient verbalized understanding and agreeable to plan.

## 2018-11-05 NOTE — Telephone Encounter (Signed)
New Message  Patient c/o Palpitations:  High priority if patient c/o lightheadedness, shortness of breath, or chest pain  1) How long have you had palpitations/irregular HR/ Afib? Are you having the symptoms now? Yes  2) Are you currently experiencing lightheadedness, SOB or CP? No  3) Do you have a history of afib (atrial fibrillation) or irregular heart rhythm? Yes  4) Have you checked your BP or HR? (document readings if available): No  5) Are you experiencing any other symptoms? No  Patient states that he is feeling the heart flutters more often since his last appointment and he is going to send a transmission in so someone in office can look and make sure everything is ok. Please give patient a call back.

## 2018-11-11 ENCOUNTER — Other Ambulatory Visit: Payer: Self-pay | Admitting: Internal Medicine

## 2018-11-26 ENCOUNTER — Telehealth: Payer: Self-pay | Admitting: Internal Medicine

## 2018-11-26 MED ORDER — AMIODARONE HCL 200 MG PO TABS: 200.0000 mg | ORAL_TABLET | Freq: Every day | ORAL | 3 refills | Status: DC

## 2018-11-26 NOTE — Telephone Encounter (Signed)
Pt c/o medication issue:  1. Name of Medication: amiodarone (PACERONE) 200 MG tablet  2. How are you currently taking this medication (dosage and times per day)? 1 tablet once a day   3. Are you having a reaction (difficulty breathing--STAT)? No  4. What is your medication issue? Patient states prescription advises him to take 1/2 a tablet daily and he was told at last appointment to take 1 tablet daily. Patient states he is running out of medication due to the fact. Please check that prescription is correct and send new prescription to pharmacy.

## 2018-12-04 ENCOUNTER — Ambulatory Visit (INDEPENDENT_AMBULATORY_CARE_PROVIDER_SITE_OTHER): Payer: Managed Care, Other (non HMO) | Admitting: *Deleted

## 2018-12-04 DIAGNOSIS — Z950 Presence of cardiac pacemaker: Secondary | ICD-10-CM

## 2018-12-08 LAB — CUP PACEART REMOTE DEVICE CHECK
Date Time Interrogation Session: 20201207145620
Implantable Lead Implant Date: 20090316
Implantable Lead Implant Date: 20090316
Implantable Lead Location: 753859
Implantable Lead Location: 753860
Implantable Lead Model: 5076
Implantable Lead Model: 5076
Implantable Pulse Generator Implant Date: 20090316

## 2018-12-17 ENCOUNTER — Other Ambulatory Visit: Payer: Managed Care, Other (non HMO) | Admitting: *Deleted

## 2018-12-17 ENCOUNTER — Other Ambulatory Visit: Payer: Self-pay

## 2018-12-17 DIAGNOSIS — Z79899 Other long term (current) drug therapy: Secondary | ICD-10-CM

## 2018-12-17 DIAGNOSIS — I428 Other cardiomyopathies: Secondary | ICD-10-CM

## 2018-12-17 DIAGNOSIS — R7989 Other specified abnormal findings of blood chemistry: Secondary | ICD-10-CM | POA: Diagnosis not present

## 2018-12-17 LAB — T3, FREE: T3, Free: 2.8 pg/mL (ref 2.0–4.4)

## 2018-12-17 LAB — T4, FREE: Free T4: 1.44 ng/dL (ref 0.82–1.77)

## 2018-12-17 LAB — TSH: TSH: 5.61 u[IU]/mL — ABNORMAL HIGH (ref 0.450–4.500)

## 2019-02-01 ENCOUNTER — Ambulatory Visit: Payer: Managed Care, Other (non HMO)

## 2019-02-06 ENCOUNTER — Ambulatory Visit: Payer: Managed Care, Other (non HMO)

## 2019-02-07 ENCOUNTER — Ambulatory Visit: Payer: Medicare Other | Attending: Internal Medicine

## 2019-02-07 DIAGNOSIS — Z23 Encounter for immunization: Secondary | ICD-10-CM | POA: Insufficient documentation

## 2019-02-07 NOTE — Progress Notes (Signed)
   Covid-19 Vaccination Clinic  Name:  Patrick Weaver    MRN: [id number] DOB: 08/18/1945  02/07/2019  Mr. Patrick Weaver was observed post Covid-19 immunization for 15 minutes without incidence. He was provided with Vaccine Information Sheet and instruction to access the V-Safe system.   Mr. Patrick Weaver was instructed to call 911 with any severe reactions post vaccine: Marland Kitchen Difficulty breathing  . Swelling of your face and throat  . A fast heartbeat  . A bad rash all over your body  . Dizziness and weakness    Immunizations Administered    Name Date Dose VIS Date Route   Pfizer COVID-19 Vaccine 02/07/2019  8:30 AM 0.3 mL 12/12/2018 Intramuscular   Manufacturer: [place]   Lot: [id number]   [place]: [id number]

## 2019-03-04 ENCOUNTER — Ambulatory Visit: Payer: Medicare Other | Attending: Internal Medicine

## 2019-03-04 DIAGNOSIS — Z23 Encounter for immunization: Secondary | ICD-10-CM | POA: Insufficient documentation

## 2019-03-04 NOTE — Progress Notes (Signed)
   Covid-19 Vaccination Clinic  Name:  Patrick Weaver    MRN: [id number] DOB: 1945/05/26  03/04/2019  [name] was observed post Covid-19 immunization for 15 minutes without incident. He was provided with Vaccine Information Sheet and instruction to access the V-Safe system.   [name] was instructed to call 911 with any severe reactions post vaccine: Marland Kitchen Difficulty breathing  . Swelling of face and throat  . A fast heartbeat  . A bad rash all over body  . Dizziness and weakness   Immunizations Administered    Name Date Dose VIS Date Route   Pfizer COVID-19 Vaccine 03/04/2019  8:20 AM 0.3 mL 12/12/2018 Intramuscular   Manufacturer: [place]   Lot: [id number]   [place]: [id number]

## 2019-03-05 ENCOUNTER — Ambulatory Visit (INDEPENDENT_AMBULATORY_CARE_PROVIDER_SITE_OTHER): Payer: 59 | Admitting: *Deleted

## 2019-03-05 DIAGNOSIS — Z950 Presence of cardiac pacemaker: Secondary | ICD-10-CM

## 2019-03-09 LAB — CUP PACEART REMOTE DEVICE CHECK
Battery Impedance: 2702 Ohm
Battery Remaining Longevity: 22 mo
Battery Voltage: 2.74 V
Brady Statistic AP VP Percent: 8 %
Brady Statistic AP VS Percent: 90 %
Brady Statistic AS VP Percent: 0 %
Brady Statistic AS VS Percent: 1 %
Date Time Interrogation Session: 20210306192450
Implantable Lead Implant Date: 20090316
Implantable Lead Implant Date: 20090316
Implantable Lead Location: 753859
Implantable Lead Location: 753860
Implantable Lead Model: 5076
Implantable Lead Model: 5076
Implantable Pulse Generator Implant Date: 20090316
Lead Channel Impedance Value: 388 Ohm
Lead Channel Impedance Value: 547 Ohm
Lead Channel Pacing Threshold Amplitude: 0.5 V
Lead Channel Pacing Threshold Amplitude: 0.75 V
Lead Channel Pacing Threshold Pulse Width: 0.4 ms
Lead Channel Pacing Threshold Pulse Width: 0.4 ms
Lead Channel Setting Pacing Amplitude: 2 V
Lead Channel Setting Pacing Amplitude: 2.5 V
Lead Channel Setting Pacing Pulse Width: 0.4 ms
Lead Channel Setting Sensing Sensitivity: 4 mV

## 2019-03-09 NOTE — Progress Notes (Signed)
PPM Remote  

## 2019-04-16 ENCOUNTER — Other Ambulatory Visit (HOSPITAL_BASED_OUTPATIENT_CLINIC_OR_DEPARTMENT_OTHER): Payer: Self-pay

## 2019-04-16 DIAGNOSIS — G4733 Obstructive sleep apnea (adult) (pediatric): Secondary | ICD-10-CM

## 2019-04-23 NOTE — Progress Notes (Signed)
Cardiology Office Note Date:  04/23/2019  Patient ID:  Patrick Weaver, Haught 30-Apr-1945, MRN [id number] PCP:  Vernie Shanks, MD  Cardiologist:  Dr. [name] Electrophysiologist: Dr. Caryl Comes   Chief Complaint:  planned f/u post amio titration  History of Present Illness: Patrick SADEK is a 74 y.o. 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 Marval Regal, HTN, HLD, ascending aortic aneurysm (monitored by Dr. [name]), OSA w/CPAP.  Last seen by Dr. Klein oct 2019, at that time, PVC burden was 3%, amio was continued.  He addressed the question of an ICD; mentioned, based on [place], given his age and nonischemic myopathy,  would not upgrade his device.  His ADL slope was adjusted for better HR response.  He was seen by Dr. [name] 09/2018, was doing well, no changes were made.   I saw him Oct 2020 He felt like he was doing well.  Mentioned he never really had a problem with water retention. He denied any CP, or SOB.  He would feel palpitations when at rest, still, otherwise unoccupied.  Not aware of them when busy.  No dizziness, near syncope or syncope. He has sleep apnea and mentions he has been waiting for the opportunity to get to his sleep specialist to update his sleep test.  He says historically he required BIPAP, but his insurance company would not cover it, and he does wake feeling like his CPAP is not enough.  Not every night though often enough he would like to be reconsidered for BIPAP. He denied exertional intolerances or DOE, when asked if he feels SOB laying on the couch supine, or otherwise, he says no, and again, mentions he doesn't feel like he is retaining fluid. His BNP the month prior was elevated, Dr. [name] counseled on sodium reduction  His PVC burden was up and his amiodarone increased to 200mg  daily  He is less aware of his palpitations/PVCs.  No CP, back pain, no rest SOB, mowed the grass last weekend (walking though self  propelled) and felt well.  No changes in his exertional capacity, has some limits, but at his baseline. No near syncope or syncope.   Device information MDT dual chamber PPM, implanted 03/17/2007  PVCs AAD Hx flecainide >> pt developed CM and required stopping drug Amiodarone started, 2017 >> current  Past Medical History:  Diagnosis Date  . Aneurysm of thoracic aorta ([place]) 07/06/2008   CT 12/18: Ascending aorta 4.5 cm, aortic root 4.6 cm    . Bladder stone   . BPH (benign prostatic hyperplasia)   . Bradycardia    a.s/p MDT dual chamber pacemaker  . Chronic systolic heart failure ([place]) 08/13/2015   Echo 10/18: Moderate LVH, EF 25, mild AI, aortic annulus dilated at 53 mm, sinotubular junction is 34 mm, proximal ascending aorta is 39 mm // Echo 12/17: Mild LVH, EF 25-30, diffuse HK, mild AI, mild MR, severe LAE, mild RAE, PASP 42 // Echo 7/17: EF 25-30, severe diffuse HK (disproportionately severe HK of the inferolateral and inferior myocardium), mild to moderate AI, aortic root 44 mm, ascend  . Dyslipidemia    failed niaspan  . History of echocardiogram    Echo 09/26/2017: EF 25-30, normal wall motion, grade 2 diastolic dysfunction, mild AI, severe LAE, mildly reduced RVSF  . HTN (hypertension)   . NICM (nonischemic cardiomyopathy) ([place])    LHC2/18: no angiographic CAD  . PVCs (premature ventricular contractions)    controlled on  Amiodarone // Holter 11/17: PVCs 1.8%  . Sleep apnea    USES C-PAP    Past Surgical History:  Procedure Laterality Date  . CYSTOSCOPY WITH LITHOLAPAXY N/A 07/12/2014   Procedure: CYSTOSCOPY WITH LITHOLAPAXY;  Surgeon: Raynelle Bring, MD;  Location: [facility];  Service: Urology;  Laterality: N/A;  . EP IMPLANTABLE DEVICE  2009  . LEFT HEART CATH AND CORONARY ANGIOGRAPHY N/A 02/15/2016   Procedure: Left Heart Cath and Coronary Angiography;  Surgeon: Burnell Blanks, MD;  Location: [facility];  Service: Cardiovascular;  Laterality: N/A;  .  TONSILLECTOMY    . TRANSURETHRAL RESECTION OF PROSTATE N/A 07/12/2014   Procedure: TRANSURETHRAL RESECTION OF THE PROSTATE (TURP);  Surgeon: Raynelle Bring, MD;  Location: [facility];  Service: Urology;  Laterality: N/A;    Current Outpatient Medications  Medication Sig Dispense Refill  . acetaminophen (TYLENOL) 325 MG tablet Take 2 tablets (650 mg total) by mouth every 6 (six) hours as needed for mild pain (or Fever >/= 101). 30 tablet 0  . amiodarone (PACERONE) 200 MG tablet Take 1 tablet (200 mg total) by mouth daily. 90 tablet 3  . atorvastatin (LIPITOR) 10 MG tablet TAKE 1 TABLET BY MOUTH EVERY DAY 90 tablet 3  . carvedilol (COREG) 6.25 MG tablet Take 1 tablet (6.25 mg total) by mouth 2 (two) times daily. 180 tablet 2  . docusate sodium (COLACE) 100 MG capsule Take 100 mg by mouth daily as needed for mild constipation or moderate constipation.    . finasteride (PROSCAR) 5 MG tablet Take 5 mg by mouth daily.  11  . gabapentin (NEURONTIN) 300 MG capsule Take 600 mg by mouth 2 (two) times daily.     . sacubitril-valsartan (ENTRESTO) 24-26 MG Take 1 tablet by mouth 2 (two) times daily. 180 tablet 2  . spironolactone (ALDACTONE) 25 MG tablet TAKE 1/2 TABLET DAILY. MAKE APPT WITH DR Caryl Comes FOR MORE REFILLS 45 tablet 3  . tamsulosin (FLOMAX) 0.4 MG CAPS capsule Take 0.4 mg by mouth daily as needed.     . vitamin B-12 (CYANOCOBALAMIN) 1000 MCG tablet Take 1,000 mcg by mouth daily.     No current facility-administered medications for this visit.    Allergies:   Codeine   Social History:  The patient  reports that he has never smoked. He has never used smokeless tobacco. He reports that he does not drink alcohol or use drugs.   Family History:  The patient's family history includes Cancer in his mother; Colon cancer in his maternal grandfather; Congestive Heart Failure in his father; Heart attack in his father; Heart disease in his father; Hypertension in his brother and mother.  ROS:  Please see the  history of present illness.    All other systems are reviewed and otherwise negative.   PHYSICAL EXAM:  VS:  There were no vitals taken for this visit. BMI: There is no height or weight on file to calculate BMI. Well nourished, well developed, in no acute distress  HEENT: normocephalic, atraumatic  Neck: no JVD, carotid bruits or masses Cardiac:  RRR; only 2 extra systoles noted, no significant murmurs, no rubs, or gallops Lungs:  CTA b/l, no wheezing, rhonchi or rales  Abd: soft, nontender MS: no deformity or atrophy Ext: no edema  Skin: warm and dry, no rash Neuro:  No gross deficits appreciated Psych: euthymic mood, full affect  PPM site is stable, no tethering or discomfort   EKG:  Not done today PPM interrogation done today  and reviewed by myself:  Battery and lead measurements are good AP 99.6 VP 8% 3 AMS episodes, no EGMs for review, no HVR He has an underlying SB 48bpm today 6 mo PVCs is 296,154  12/06/2017: CT chest IMPRESSION: Aneurysmal dilatation of the ascending aorta is 4.3 cm compared with 4.5 cm on the prior study. 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 These measurements are not significantly changed. Recommend annual imaging followup by CTA or MRA. This recommendation follows 2010 ACCF/AHA/AATS/ACR/ASA/SCA/SCAI/SIR/STS/SVM Guidelines for the Diagnosis and Management of Patients with Thoracic Aortic Disease. Circulation. 2010; 121: [id number]   09/26/17: TTE Study Conclusions - Left ventricle: The cavity size was mildly dilated. Wall   thickness was normal. Systolic function was severely reduced. The   estimated ejection fraction was in the range of 25% to 30%. Wall   motion was normal; there were no regional wall motion   abnormalities. There was a reduced contribution of atrial   contraction to ventricular filling, due to increased ventricular   diastolic pressure or atrial contractile  dysfunction. Features   are consistent with a pseudonormal left ventricular filling   pattern, with concomitant abnormal relaxation and increased   filling pressure (grade 2 diastolic dysfunction). - Ventricular septum: Septal motion showed abnormal function,   dyssynergy, and paradox. - Aortic valve: There was mild regurgitation. - Left atrium: The atrium was severely dilated. - Right ventricle: The cavity size was mildly dilated. Systolic   function was mildly reduced.   Chest CTA 12/04/2016 IMPRESSION: 1. Stable aneurysmal disease of the aortic root and ascending thoracic aorta. Measurements are stable since 2011 with maximal diameter of the ascending thoracic aorta of 4.5 cm. Aortic root at the level of the sinuses of Valsalva measures approximately 4.6 cm. 2. Stable scattered tiny nodules in the right upper lobe. 3. Stable scattered small hepatic cysts and hepatic steatosis.  Echo 10/08/2016 Moderate LVH, EF 25, mild AI, aortic annulus dilated at 53 mm, sinotubular junction is 34 mm, proximal ascending aorta is 39 mm  Cardiac catheterization 02/15/2016 1. No angiographic evidence of CAD 2. Non-ischemic cardiomyopathy  Echo 12/02/2015 Mild LVH, EF 25-30, diffuse HK, mild AI, mild MR, severe LAE, mild RAE, PASP 42  Holter 11/30/2015 SR 61 to 138 bpm Average HR 72 bpm Occasional PVC (1.8% total beats)  Echo 07/22/2015 EF 25-30, severe diffuse HK (disproportionately severe HK of the inferolateral and inferior myocardium), mild to moderate AI, aortic root 44 mm, ascending aorta 40 mm, severe LAE, mild RAE, PASP 36  Recent Labs: 09/15/2018: ALT 11; BUN 13; Creatinine, Ser 1.30; Hemoglobin 15.0; NT-Pro BNP 1,084; Platelets 132; Potassium 4.2; Sodium 139 12/17/2018: TSH 5.610  09/15/2018: Chol/HDL Ratio 3.6; Cholesterol, Total 102; HDL 28; LDL Chol Calc (NIH) 51; Triglycerides 131   CrCl cannot be calculated (Patient's most recent lab result is older than the maximum 21 days  allowed.).   Wt Readings from Last 3 Encounters:  10/30/18 215 lb (97.5 kg)  09/15/18 211 lb 3.2 oz (95.8 kg)  10/08/17 213 lb 3.2 oz (96.7 kg)     Other studies reviewed: Additional studies/records reviewed today include: summarized above  ASSESSMENT AND PLAN:  1. PPM     intact function     No programming changes made     2. PVCs     Burden is much better on amio 200mg  daily 10/2018-04/2018 had 296,154 10/2017-10/2018 had [id number] 04/2017-10/2017 had 604,594  amio labs today No  SOB, pulm symptoms    3. NICM 4. Chronic CHF (systolic)     Dr. Caryl Comes did not think device upgrade was indicated     No symptoms or exam findings of volume OL  Plan an echo at his next visit He is also pending sleep/CPAP totration study next week.  If his tx is adjusted should help his PVCs as well  5. Ascending aortic aneurysm     Over due for CT     Creat in Sept was 1.3     BMET today and plan for CT   Disposition: continue A 71mo remotes, he will follow up with his CPAP titration, will plan for an echo next visit in 83mo, with better PVC suppression and hopefully better a[nea treatment  Current medicines are reviewed at length with the patient today.  The patient did not have any concerns regarding medicines  Signed, Tommye Standard, PA-C 04/23/2019 6:09 AM     [facility] [address] [place] [address] [phone] (office)  [phone] (fax)

## 2019-04-24 ENCOUNTER — Other Ambulatory Visit: Payer: Self-pay

## 2019-04-24 ENCOUNTER — Ambulatory Visit (INDEPENDENT_AMBULATORY_CARE_PROVIDER_SITE_OTHER): Payer: 59 | Admitting: Physician Assistant

## 2019-04-24 VITALS — BP 120/68 | Ht 72.0 in | Wt 218.0 lb

## 2019-04-24 DIAGNOSIS — Z79899 Other long term (current) drug therapy: Secondary | ICD-10-CM | POA: Diagnosis not present

## 2019-04-24 DIAGNOSIS — I719 Aortic aneurysm of unspecified site, without rupture: Secondary | ICD-10-CM

## 2019-04-24 DIAGNOSIS — Z950 Presence of cardiac pacemaker: Secondary | ICD-10-CM

## 2019-04-24 DIAGNOSIS — I493 Ventricular premature depolarization: Secondary | ICD-10-CM | POA: Diagnosis not present

## 2019-04-24 DIAGNOSIS — I428 Other cardiomyopathies: Secondary | ICD-10-CM

## 2019-04-24 LAB — BASIC METABOLIC PANEL
BUN/Creatinine Ratio: 9 — ABNORMAL LOW (ref 10–24)
BUN: 11 mg/dL (ref 8–27)
CO2: 25 mmol/L (ref 20–29)
Calcium: 9.6 mg/dL (ref 8.6–10.2)
Chloride: 104 mmol/L (ref 96–106)
Creatinine, Ser: 1.26 mg/dL (ref 0.76–1.27)
GFR calc Af Amer: 66 mL/min/{1.73_m2} (ref >59)
GFR calc non Af Amer: 57 mL/min/{1.73_m2} — ABNORMAL LOW (ref >59)
Glucose: 91 mg/dL (ref 65–99)
Potassium: 4.6 mmol/L (ref 3.5–5.2)
Sodium: 142 mmol/L (ref 134–144)

## 2019-04-24 LAB — HEPATIC FUNCTION PANEL
ALT: 13 IU/L (ref 0–44)
AST: 14 IU/L (ref 0–40)
Albumin: 4.7 g/dL (ref 3.7–4.7)
Alkaline Phosphatase: 81 IU/L (ref 39–117)
Bilirubin Total: 0.5 mg/dL (ref 0.0–1.2)
Bilirubin, Direct: 0.12 mg/dL (ref 0.00–0.40)
Total Protein: 6.6 g/dL (ref 6.0–8.5)

## 2019-04-24 LAB — TSH: TSH: 3.95 u[IU]/mL (ref 0.450–4.500)

## 2019-04-24 NOTE — Patient Instructions (Signed)
Medication Instructions:   Your physician recommends that you continue on your current medications as directed. Please refer to the Current Medication list given to you today.   *If you need a refill on your cardiac medications before your next appointment, please call your pharmacy*  Lab Work:  BMET LFT AND TSH  TODAY   If you have labs (blood work) drawn today and your tests are completely normal, you will receive your results only by: Marland Kitchen MyChart Message (if you have MyChart) OR . A paper copy in the mail If you have any lab test that is abnormal or we need to change your treatment, we will call you to review the results.   Testing/Procedures: Non-Cardiac CT Angiography (CTA), is a special type of CT scan that uses a computer to produce multi-dimensional views of major blood vessels throughout the body. In CT angiography, a contrast material is injected through an IV to help visualize the blood vessels   Follow-Up: At [facility], you and your health needs are our priority.  As part of our continuing mission to provide you with exceptional heart care, we have created designated Provider Care Teams.  These Care Teams include your primary Cardiologist (physician) and Advanced Practice Providers (APPs -  Physician Assistants and Nurse Practitioners) who all work together to provide you with the care you need, when you need it.  We recommend signing up for the patient portal called "MyChart".  Sign up information is provided on this After Visit Summary.  MyChart is used to connect with patients for Virtual Visits (Telemedicine).  Patients are able to view lab/test results, encounter notes, upcoming appointments, etc.  Non-urgent messages can be sent to your provider as well.   To learn more about what you can do with MyChart, go to [email or web address].    Your next appointment:   6 month(s)  The format for your next appointment:   In Person  Provider:   You may see Virl Axe, MD  or one of the following Advanced Practice Providers on your designated Care Team:    Chanetta Marshall, NP  Tommye Standard, [place]  Legrand Como "Oda Kilts, [place]    Other Instructions

## 2019-04-27 ENCOUNTER — Other Ambulatory Visit (HOSPITAL_COMMUNITY)
Admission: RE | Admit: 2019-04-27 | Discharge: 2019-04-27 | Disposition: A | Discharge status: Home / Self Care | Payer: No Typology Code available for payment source | Source: Ambulatory Visit | Attending: Internal Medicine | Admitting: Internal Medicine

## 2019-04-27 DIAGNOSIS — Z01812 Encounter for preprocedural laboratory examination: Secondary | ICD-10-CM | POA: Insufficient documentation

## 2019-04-27 DIAGNOSIS — Z20822 Contact with and (suspected) exposure to covid-19: Secondary | ICD-10-CM | POA: Insufficient documentation

## 2019-04-27 LAB — SARS CORONAVIRUS 2 (TAT 6-24 HRS): SARS Coronavirus 2: NEGATIVE

## 2019-04-29 ENCOUNTER — Other Ambulatory Visit: Payer: Self-pay

## 2019-04-29 ENCOUNTER — Ambulatory Visit (HOSPITAL_BASED_OUTPATIENT_CLINIC_OR_DEPARTMENT_OTHER): Payer: 59 | Attending: Internal Medicine | Admitting: Internal Medicine

## 2019-04-29 DIAGNOSIS — I493 Ventricular premature depolarization: Secondary | ICD-10-CM | POA: Insufficient documentation

## 2019-04-29 DIAGNOSIS — G4733 Obstructive sleep apnea (adult) (pediatric): Secondary | ICD-10-CM | POA: Diagnosis present

## 2019-05-01 NOTE — Procedures (Signed)
   NAME: Patrick Weaver DATE OF BIRTH:  08-Sep-1945 MEDICAL RECORD NUMBER [id number]  LOCATION: [facility]  PHYSICIAN: Marius Ditch  DATE OF STUDY: 04/29/2019  SLEEP STUDY TYPE: Positive Airway Pressure Titration               REFERRING PHYSICIAN: Marius Ditch, MD  INDICATION FOR STUDY: H/O OSA on BPAP. Old studies not available. Patient had been restarted on APAP and had continuing symptoms and inadequately controlled AHID  EPWORTH SLEEPINESS SCORE:  NA HEIGHT: 6' (182.9 cm)  WEIGHT: 215 lb (97.5 kg)    Body mass index is 29.16 kg/m.  NECK SIZE: 17 in.  MEDICATIONS Patient self administered medications include: N/A. Medications administered during study include No sleep medicine administered.Marland Kitchen  SLEEP STUDY TECHNIQUE The patient underwent an attended overnight polysomnography titration to assess the effects of cpap therapy. The following variables were monitored: EEG (C4-A1, C3-A2, O1-A2, O2-A1, F3-M2, F4-M1), EOG, submental and leg EMG, ECG, oxyhemoglobin saturation by pulse oximetry, thoracic and abdominal respiratory effort belts, nasal/oral airflow by pressure sensor, body position sensor and snoring sensor. CPAP pressure was titrated to eliminate apneas, hypopneas and oxygen desaturation.  TECHNICAL COMMENTS Comments added by Technician: PATIENT WAS ORDERED AS A CPAP TITRATION. Comments added by Scorer: N/A  SLEEP ARCHITECTURE The study was initiated at 10:30:20 PM and terminated at 4:46:16 AM. Total recorded time was 375.9 minutes. EEG confirmed total sleep time was 334.1 minutes yielding a sleep efficiency of 88.9%%. Sleep onset after lights out was 15.3 minutes with a REM latency of 81.0 minutes. The patient spent 18.3%% of the night in stage N1 sleep, 63.9%% in stage N2 sleep, 0.0%% in stage N3 and 17.8% in REM. The Arousal Index was 24.4/hour.  RESPIRATORY PARAMETERS The overall AHI was 14.0 per hour, and the RDI was 22.1 events/hour with a  central apnea index of 0.4per hour. The most appropriate setting of BiPAP was IPAP/EPAP 22/16 cm H2O. At this setting, the sleep efficiency was 90% and the patient was supine for 27% of the time. The AHI was 5.4 events per hour, and the RDI was 5.4 events/hour (with 0.4 central events) and the arousal index was 8.1 per hour at optimal pressure.The oxygen nadir was 89.0% during sleep.  LEG MOVEMENT DATA The total leg movements were 4 with a resulting leg movement index of 0.7. Associated arousal with leg movement index was 0.4.  CARDIAC DATA The underlying cardiac rhythm was most consistent with sinus rhythm. Mean heart rate during sleep was 70.0 bpm. Additional rhythm abnormalities include PVCs.  IMPRESSIONS - Obstructive Sleep apnea(OSA). Good titration with optimal pressure attained. - No Significant Central Sleep Apnea (CSA) - No significant periodic leg movements(PLMs) during sleep.  - Electrocardiographic data showed presence of PVCs.  DIAGNOSIS - Obstructive Sleep Apnea (327.23 [G47.33 ICD-10])  RECOMMENDATIONS - Trial of BiPAP therapy on 22/16 cm H2O.  Marius Ditch Sleep specialist, [place] Board of [employer] Medicine  ELECTRONICALLY SIGNED ON:  05/01/2019, 7:03 PM [place] PH: [phone]   FX: [phone] [place]

## 2019-05-04 ENCOUNTER — Ambulatory Visit (HOSPITAL_COMMUNITY)
Admission: RE | Admit: 2019-05-04 | Discharge: 2019-05-04 | Disposition: A | Discharge status: Home / Self Care | Payer: 59 | Source: Ambulatory Visit | Attending: Physician Assistant | Admitting: Physician Assistant

## 2019-05-04 ENCOUNTER — Other Ambulatory Visit: Payer: Self-pay

## 2019-05-04 DIAGNOSIS — I719 Aortic aneurysm of unspecified site, without rupture: Secondary | ICD-10-CM | POA: Diagnosis present

## 2019-05-04 DIAGNOSIS — Z79899 Other long term (current) drug therapy: Secondary | ICD-10-CM

## 2019-05-04 MED ORDER — IOHEXOL 350 MG/ML SOLN
100.0000 mL | Freq: Once | INTRAVENOUS | Status: AC | PRN
  Administered 2019-05-04: 100 mL via INTRAVENOUS

## 2019-05-11 NOTE — Telephone Encounter (Signed)
Spoke with pt and advised per Tommye Standard, PA, pt's Aorta is stable. Continue annual CTs. There are findings of cysts on his pancreas. Radiologist recommends follow up (non-emergent) MRI. Please forward to his PMD and have him follow up with his primary. MD.  Pt advised information has been faxed to PCP.  Pt verbalizes understanding and agrees with current plan.

## 2019-06-04 ENCOUNTER — Ambulatory Visit (INDEPENDENT_AMBULATORY_CARE_PROVIDER_SITE_OTHER): Payer: No Typology Code available for payment source | Admitting: *Deleted

## 2019-06-04 DIAGNOSIS — I428 Other cardiomyopathies: Secondary | ICD-10-CM | POA: Diagnosis not present

## 2019-06-04 LAB — CUP PACEART REMOTE DEVICE CHECK
Battery Impedance: 2956 Ohm
Battery Remaining Longevity: 19 mo
Battery Voltage: 2.72 V
Brady Statistic AP VP Percent: 7 %
Brady Statistic AP VS Percent: 90 %
Brady Statistic AS VP Percent: 1 %
Brady Statistic AS VS Percent: 2 %
Date Time Interrogation Session: 20210603131418
Implantable Lead Implant Date: 20090316
Implantable Lead Implant Date: 20090316
Implantable Lead Location: 753859
Implantable Lead Location: 753860
Implantable Lead Model: 5076
Implantable Lead Model: 5076
Implantable Pulse Generator Implant Date: 20090316
Lead Channel Impedance Value: 370 Ohm
Lead Channel Impedance Value: 512 Ohm
Lead Channel Pacing Threshold Amplitude: 0.5 V
Lead Channel Pacing Threshold Amplitude: 0.625 V
Lead Channel Pacing Threshold Pulse Width: 0.4 ms
Lead Channel Pacing Threshold Pulse Width: 0.4 ms
Lead Channel Setting Pacing Amplitude: 2 V
Lead Channel Setting Pacing Amplitude: 2.5 V
Lead Channel Setting Pacing Pulse Width: 0.4 ms
Lead Channel Setting Sensing Sensitivity: 4 mV

## 2019-06-09 NOTE — Progress Notes (Signed)
Remote pacemaker transmission.   

## 2019-08-11 ENCOUNTER — Other Ambulatory Visit: Payer: Self-pay | Admitting: Internal Medicine

## 2019-08-17 ENCOUNTER — Telehealth: Payer: Self-pay | Admitting: Internal Medicine

## 2019-08-17 MED ORDER — ENTRESTO 24-26 MG PO TABS: 1.0000 | ORAL_TABLET | Freq: Two times a day (BID) | ORAL | 0 refills | Status: DC

## 2019-08-17 NOTE — Telephone Encounter (Signed)
Pt's medication was sent to pt's pharmacy as requested. Confirmation received.  °

## 2019-08-17 NOTE — Telephone Encounter (Signed)
°*  STAT* If patient is at the pharmacy, call can be transferred to refill team.   1. Which medications need to be refilled? (please list name of each medication and dose if known) Entresto   2. Which pharmacy/location (including street and city if local pharmacy) is medication to be sent to? [facility] store number  3. Do they need a 30 day or 90 day supply? [place]

## 2019-09-02 LAB — CUP PACEART REMOTE DEVICE CHECK
Battery Impedance: 3278 Ohm
Battery Remaining Longevity: 17 mo
Battery Voltage: 2.73 V
Brady Statistic AP VP Percent: 7 %
Brady Statistic AP VS Percent: 90 %
Brady Statistic AS VP Percent: 1 %
Brady Statistic AS VS Percent: 2 %
Date Time Interrogation Session: 20210901094646
Implantable Lead Implant Date: 20090316
Implantable Lead Implant Date: 20090316
Implantable Lead Location: 753859
Implantable Lead Location: 753860
Implantable Lead Model: 5076
Implantable Lead Model: 5076
Implantable Pulse Generator Implant Date: 20090316
Lead Channel Impedance Value: 401 Ohm
Lead Channel Impedance Value: 497 Ohm
Lead Channel Pacing Threshold Amplitude: 0.5 V
Lead Channel Pacing Threshold Amplitude: 0.625 V
Lead Channel Pacing Threshold Pulse Width: 0.4 ms
Lead Channel Pacing Threshold Pulse Width: 0.4 ms
Lead Channel Setting Pacing Amplitude: 2 V
Lead Channel Setting Pacing Amplitude: 2.5 V
Lead Channel Setting Pacing Pulse Width: 0.4 ms
Lead Channel Setting Sensing Sensitivity: 4 mV

## 2019-09-03 ENCOUNTER — Ambulatory Visit (INDEPENDENT_AMBULATORY_CARE_PROVIDER_SITE_OTHER): Payer: No Typology Code available for payment source | Admitting: *Deleted

## 2019-09-03 DIAGNOSIS — I495 Sick sinus syndrome: Secondary | ICD-10-CM | POA: Diagnosis not present

## 2019-09-04 NOTE — Progress Notes (Signed)
Remote pacemaker transmission.   

## 2019-09-15 ENCOUNTER — Telehealth: Payer: Self-pay | Admitting: Internal Medicine

## 2019-09-15 NOTE — Telephone Encounter (Signed)
Spoke with pt and made him aware that per Dr. [name]' previous MyChart message, she recommended flu shot in Oct and booster in Nov.  Advised guidelines are saying booster shot 8 months after last dose and we would not suggest otherwise.  Pt verbalized understanding and was appreciative for call.

## 2019-09-15 NOTE — Telephone Encounter (Signed)
Patient states that his wife sent a MyChart message a few weeks ago asking about the covid booster shot. He states they were advised to get the flu shot in October and the booster in November. But, he states his wife is having a fit to get the booster shot now. He wants to know if it is too early to do so. November will be 8 months since his second dose of the vaccine. Please advise.

## 2019-10-02 ENCOUNTER — Encounter: Payer: Self-pay | Admitting: Internal Medicine

## 2019-10-02 ENCOUNTER — Other Ambulatory Visit: Payer: Self-pay

## 2019-10-02 ENCOUNTER — Ambulatory Visit (INDEPENDENT_AMBULATORY_CARE_PROVIDER_SITE_OTHER): Payer: No Typology Code available for payment source | Admitting: Internal Medicine

## 2019-10-02 VITALS — BP 118/70 | HR 77 | Ht 72.0 in | Wt 218.0 lb

## 2019-10-02 DIAGNOSIS — I428 Other cardiomyopathies: Secondary | ICD-10-CM

## 2019-10-02 DIAGNOSIS — E785 Hyperlipidemia, unspecified: Secondary | ICD-10-CM

## 2019-10-02 DIAGNOSIS — I5022 Chronic systolic (congestive) heart failure: Secondary | ICD-10-CM

## 2019-10-02 DIAGNOSIS — I493 Ventricular premature depolarization: Secondary | ICD-10-CM | POA: Diagnosis not present

## 2019-10-02 NOTE — Patient Instructions (Signed)
Medication Instructions:  No changes *If you need a refill on your cardiac medications before your next appointment, please call your pharmacy*   Lab Work: Today: cbc, tsh, lipids, liver, HgA1c  If you have labs (blood work) drawn today and your tests are completely normal, you will receive your results only by: Marland Kitchen MyChart Message (if you have MyChart) OR . A paper copy in the mail If you have any lab test that is abnormal or we need to change your treatment, we will call you to review the results.   Testing/Procedures: Your physician has requested that you have an echocardiogram. Echocardiography is a painless test that uses sound waves to create images of your heart. It provides your doctor with information about the size and shape of your heart and how well your heart's chambers and valves are working. This procedure takes approximately one hour. There are no restrictions for this procedure.  Your physician has recommended that you wear a Zio heart monitor. Heart monitors are medical devices that record the heart's electrical activity. Doctors most often use these monitors to diagnose arrhythmias. Arrhythmias are problems with the speed or rhythm of the heartbeat. The monitor is a small, portable device. You can wear one while you do your normal daily activities. This is usually used to diagnose what is causing palpitations/syncope (passing out).   Follow-Up:  Follow up with your physician will depend on test results.

## 2019-10-02 NOTE — Progress Notes (Signed)
Cardiology Office Note Date:  10/02/2019  Patient ID:  Patrick, Weaver [birth year], MRN [id number] PCP:  Vernie Shanks, MD  Cardiologist:  Dr. [name] Electrophysiologist: Dr. Caryl Comes  Pt presents for f/u of NICM and PVCs    History of Present Illness: Patrick Weaver is a 74 y.o. male with history of NICM, chronic CHF, bradycardia w/PPM, PVCs, CKD Stage III (followed in renal clnic), HTN, HLD, ascending aortic aneurysm and  OSA w/CPAP.  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 Aneurysm of thoracic aorta ([place]) 07/06/2008   CT 12/18: Ascending aorta 4.5 cm, aortic root 4.6 cm    . Bladder stone   . BPH (benign prostatic hyperplasia)   . Bradycardia    a.s/p MDT dual chamber pacemaker  . Chronic systolic heart failure ([place]) 08/13/2015   Echo 10/18: Moderate LVH, EF 25, mild AI, aortic annulus dilated at 53 mm, sinotubular junction is 34 mm, proximal ascending aorta is 39 mm // Echo 12/17: Mild LVH, EF 25-30, diffuse HK, mild AI, mild MR, severe LAE, mild RAE, PASP 42 // Echo 7/17: EF 25-30, severe diffuse HK (disproportionately severe HK of the inferolateral and inferior myocardium), mild to moderate AI, aortic root 44 mm, ascend  . Dyslipidemia    failed niaspan  . History of echocardiogram    Echo 09/26/2017: EF 25-30, normal wall motion, grade 2 diastolic dysfunction, mild AI, severe LAE, mildly reduced RVSF  . HTN (hypertension)   . NICM (nonischemic cardiomyopathy) ([place])    LHC2/18:  no angiographic CAD  . PVCs (premature ventricular contractions)    controlled on Amiodarone // Holter 11/17: PVCs 1.8%  . Sleep apnea    USES C-PAP    Past Surgical History:  Procedure Laterality Date  . CYSTOSCOPY WITH LITHOLAPAXY N/A 07/12/2014   Procedure: CYSTOSCOPY WITH LITHOLAPAXY;  Surgeon: Raynelle Bring, MD;  Location: [facility];  Service: Urology;  Laterality: N/A;  . EP IMPLANTABLE DEVICE  2009  . LEFT HEART CATH AND CORONARY ANGIOGRAPHY N/A 02/15/2016   Procedure: Left Heart Cath and Coronary Angiography;  Surgeon: Burnell Blanks, MD;  Location: [facility];  Service: Cardiovascular;  Laterality: N/A;  . TONSILLECTOMY    . TRANSURETHRAL RESECTION OF PROSTATE N/A 07/12/2014   Procedure: TRANSURETHRAL RESECTION OF THE PROSTATE (TURP);  Surgeon: Raynelle Bring, MD;  Location: [facility];  Service: Urology;  Laterality: N/A;    Current Outpatient Medications  Medication Sig Dispense Refill  . acetaminophen (TYLENOL) 325 MG tablet Take 2 tablets (650 mg total) by mouth every 6 (six) hours as needed for mild pain (or Fever >/= 101). 30 tablet 0  . amiodarone (PACERONE) 200 MG tablet Take 1 tablet (200 mg total) by mouth daily. 90 tablet 3  . atorvastatin (LIPITOR) 10 MG tablet TAKE 1 TABLET BY MOUTH EVERY DAY 90 tablet 3  . carvedilol (COREG) 6.25 MG tablet TAKE 1  TABLET BY MOUTH TWICE A DAY 180 tablet 2  . docusate sodium (COLACE) 100 MG capsule Take 100 mg by mouth daily as needed for mild constipation or moderate constipation.    . finasteride (PROSCAR) 5 MG tablet Take 5 mg by mouth daily.  11  . gabapentin (NEURONTIN) 300 MG capsule Take 600 mg by mouth 2 (two) times daily.     . sacubitril-valsartan (ENTRESTO) 24-26 MG Take 1 tablet by mouth 2 (two) times daily. Please keep upcoming appt in October with Dr. [name] before anymore refills. Thank you 180 tablet 0  . spironolactone (ALDACTONE) 25 MG tablet TAKE 1/2 TABLET DAILY. MAKE APPT WITH DR Caryl Comes FOR MORE REFILLS 45  tablet 3  . tamsulosin (FLOMAX) 0.4 MG CAPS capsule Take 0.4 mg by mouth daily as needed.      No current facility-administered medications for this visit.    Allergies:   Codeine   Social History:  The patient  reports that he has never smoked. He has never used smokeless tobacco. He reports that he does not drink alcohol and does not use drugs.   Family History:  The patient's family history includes Cancer in his mother; Colon cancer in his maternal grandfather; Congestive Heart Failure in his father; Heart attack in his father; Heart disease in his father; Hypertension in his brother and mother.  ROS:  Please see the history of present illness.    All other systems are reviewed and otherwise negative.   PHYSICAL EXAM:  VS:  BP 118/70   Pulse 77   Ht 6' (1.829 m)   Wt 218 lb (98.9 kg)   SpO2 93%   BMI 29.57 kg/m  BMI: Body mass index is 29.57 kg/m. Well nourished, well developed, in no acute distress  HEENT: normocephalic, atraumatic  Neck: JVP is normal  No bruits   Cardiac:  RRR;, no significant murmurs Lungs:  CTA  No rales or wheezes    Abd: soft, nontender MS: no deformity Moving all extremities  Ext: no edema  Skin: warm and dry, no rash Neuro:  No gross deficits appreciated Psych: euthymic mood, full affect   EKG: Atrial paced 78 bpm   Frequent PVCs     09/26/17: TTE Study Conclusions - Left ventricle: The cavity size was mildly dilated. Wall   thickness was normal. Systolic function was severely reduced. The   estimated ejection fraction was in the range of 25% to 30%. Wall   motion was normal; there were no regional wall motion   abnormalities. There was a reduced contribution of atrial   contraction to ventricular filling, due to increased ventricular   diastolic pressure or atrial contractile dysfunction. Features   are consistent with a pseudonormal left ventricular filling   pattern, with concomitant abnormal relaxation and increased   filling  pressure (grade 2 diastolic dysfunction). - Ventricular septum: Septal motion showed abnormal function,   dyssynergy, and paradox. - Aortic valve: There was mild regurgitation. - Left atrium: The atrium was severely dilated. - Right ventricle: The cavity size was mildly dilated. Systolic   function was mildly reduced.   Cardiac catheterization 02/15/2016 1. No angiographic evidence of CAD 2. Non-ischemic cardiomyopathy    Recent Labs: 04/24/2019: ALT 13; BUN 11; Creatinine, Ser 1.26; Potassium 4.6; Sodium 142; TSH 3.950  No results found for requested labs within last 8760 hours.   CrCl cannot be calculated (Patient's most recent lab result is older than the maximum 21 days allowed.).   Wt Readings from  Last 3 Encounters:  10/02/19 218 lb (98.9 kg)  04/29/19 215 lb (97.5 kg)  04/24/19 218 lb (98.9 kg)     Other studies reviewed: Additional studies/records reviewed today include: summarized above  ASSESSMENT AND PLAN:  1.NICM   Volume status appears ok on exam   Will repeat echo to reassess LVEF    No changes in meds    2. PVCs   The pt is currently on amiodarone 200 mg daily   PVC burden on last interrogation was not high  But, EKG shows frequent PVCs    Would set up for 24 hour ZIo patch to confirm low PVC burden Check labs given amio use    Depending on monitor findings may taper back on amio Pt will need PFTs   3  . Ascending aortic aneurysm  CT in May 2021 showed stable asc aortic aneurysm (4.3 cm)  4  Pancreatic cyst  Picked up o CT scan   Pt has been seen at [facility]  Will be followed locally now.        Follow up in clinic will be based on test results   Current medicines are reviewed at length with the patient today.  The patient did not have any concerns regarding medicines  Signed, Tommye Standard, PA-C 10/02/2019 4:11 PM     [address] [place] [address] [phone] (office)  [phone] (fax)

## 2019-10-03 LAB — HEPATIC FUNCTION PANEL
ALT: 13 IU/L (ref 0–44)
AST: 9 IU/L (ref 0–40)
Albumin: 5.2 g/dL — ABNORMAL HIGH (ref 3.7–4.7)
Alkaline Phosphatase: 100 IU/L (ref 44–121)
Bilirubin Total: 0.5 mg/dL (ref 0.0–1.2)
Bilirubin, Direct: 0.11 mg/dL (ref 0.00–0.40)
Total Protein: 6.9 g/dL (ref 6.0–8.5)

## 2019-10-03 LAB — CBC
Hematocrit: 42.9 % (ref 37.5–51.0)
Hemoglobin: 14.8 g/dL (ref 13.0–17.7)
MCH: 31.3 pg (ref 26.6–33.0)
MCHC: 34.5 g/dL (ref 31.5–35.7)
MCV: 91 fL (ref 79–97)
Platelets: 154 10*3/uL (ref 150–450)
RBC: 4.73 x10E6/uL (ref 4.14–5.80)
RDW: 12.9 % (ref 11.6–15.4)
WBC: 6.6 10*3/uL (ref 3.4–10.8)

## 2019-10-03 LAB — LIPID PANEL
Chol/HDL Ratio: 4.2 ratio (ref 0.0–5.0)
Cholesterol, Total: 122 mg/dL (ref 100–199)
HDL: 29 mg/dL — ABNORMAL LOW (ref >39)
LDL Chol Calc (NIH): 63 mg/dL (ref 0–99)
Triglycerides: 180 mg/dL — ABNORMAL HIGH (ref 0–149)
VLDL Cholesterol Cal: 30 mg/dL (ref 5–40)

## 2019-10-03 LAB — TSH: TSH: 4.29 u[IU]/mL (ref 0.450–4.500)

## 2019-10-03 LAB — HEMOGLOBIN A1C
Est. average glucose Bld gHb Est-mCnc: 100 mg/dL
Hgb A1c MFr Bld: 5.1 % (ref 4.8–5.6)

## 2019-10-06 ENCOUNTER — Other Ambulatory Visit (INDEPENDENT_AMBULATORY_CARE_PROVIDER_SITE_OTHER): Payer: No Typology Code available for payment source

## 2019-10-06 DIAGNOSIS — I428 Other cardiomyopathies: Secondary | ICD-10-CM

## 2019-10-06 DIAGNOSIS — I493 Ventricular premature depolarization: Secondary | ICD-10-CM

## 2019-10-06 DIAGNOSIS — I5022 Chronic systolic (congestive) heart failure: Secondary | ICD-10-CM

## 2019-10-06 DIAGNOSIS — E785 Hyperlipidemia, unspecified: Secondary | ICD-10-CM | POA: Diagnosis not present

## 2019-10-09 ENCOUNTER — Other Ambulatory Visit: Payer: Self-pay | Admitting: Gastroenterology

## 2019-10-15 ENCOUNTER — Other Ambulatory Visit: Payer: Self-pay | Admitting: Internal Medicine

## 2019-10-19 ENCOUNTER — Other Ambulatory Visit: Payer: Self-pay | Admitting: Physician Assistant

## 2019-10-21 ENCOUNTER — Telehealth: Payer: Self-pay

## 2019-10-21 NOTE — Telephone Encounter (Signed)
Left patient a message to call back regarding recent monitor results.

## 2019-10-21 NOTE — Telephone Encounter (Signed)
-----   Message from [place], MD sent at 10/19/2019  9:50 PM EDT ----- PVCs are controlled    I would take amiodarone 6 days per week   Hold on Sunday

## 2019-10-22 ENCOUNTER — Other Ambulatory Visit: Payer: Self-pay

## 2019-10-22 ENCOUNTER — Ambulatory Visit (HOSPITAL_COMMUNITY): Payer: No Typology Code available for payment source | Attending: Cardiology

## 2019-10-22 DIAGNOSIS — I493 Ventricular premature depolarization: Secondary | ICD-10-CM

## 2019-10-22 DIAGNOSIS — I428 Other cardiomyopathies: Secondary | ICD-10-CM

## 2019-10-22 DIAGNOSIS — E785 Hyperlipidemia, unspecified: Secondary | ICD-10-CM

## 2019-10-22 DIAGNOSIS — I5022 Chronic systolic (congestive) heart failure: Secondary | ICD-10-CM | POA: Diagnosis not present

## 2019-10-22 LAB — ECHOCARDIOGRAM COMPLETE
P 1/2 time: 402 msec
S' Lateral: 5.1 cm

## 2019-10-29 ENCOUNTER — Telehealth: Payer: Self-pay | Admitting: *Deleted

## 2019-10-29 MED ORDER — AMIODARONE HCL 200 MG PO TABS
ORAL_TABLET | ORAL | 3 refills | Status: DC
Start: 2019-10-29 — End: 2020-08-25

## 2019-10-29 NOTE — Telephone Encounter (Signed)
Patient informed of monitor results and recommendation for amiodarone 200 mg - one tablet daily except Sundays.

## 2019-10-29 NOTE — Telephone Encounter (Signed)
-----   Message from [place], MD sent at 10/19/2019  9:50 PM EDT ----- PVCs are controlled    I would take amiodarone 6 days per week   Hold on Sunday

## 2019-11-03 ENCOUNTER — Other Ambulatory Visit: Payer: Self-pay | Admitting: Internal Medicine

## 2019-11-10 ENCOUNTER — Encounter (HOSPITAL_COMMUNITY): Payer: Self-pay | Admitting: Gastroenterology

## 2019-11-10 NOTE — Progress Notes (Signed)
Attempted to obtain medical history via telephone, unable to reach at this time. I left a voicemail to return pre surgical testing department's phone call.  

## 2019-11-18 ENCOUNTER — Encounter (HOSPITAL_COMMUNITY): Payer: Self-pay | Admitting: Certified Registered Nurse Anesthetist

## 2019-11-19 ENCOUNTER — Ambulatory Visit (HOSPITAL_COMMUNITY): Admission: RE | Admit: 2019-11-19 | Payer: Medicare Other | Source: Home / Self Care | Admitting: Gastroenterology

## 2019-11-19 ENCOUNTER — Encounter (HOSPITAL_COMMUNITY): Admission: RE | Payer: Self-pay | Source: Home / Self Care

## 2019-11-19 SURGERY — COLONOSCOPY WITH PROPOFOL
Anesthesia: Monitor Anesthesia Care

## 2019-12-03 ENCOUNTER — Ambulatory Visit (INDEPENDENT_AMBULATORY_CARE_PROVIDER_SITE_OTHER): Payer: No Typology Code available for payment source

## 2019-12-03 DIAGNOSIS — I495 Sick sinus syndrome: Secondary | ICD-10-CM | POA: Diagnosis not present

## 2019-12-03 LAB — CUP PACEART REMOTE DEVICE CHECK
Battery Impedance: 3415 Ohm
Battery Remaining Longevity: 16 mo
Battery Voltage: 2.71 V
Brady Statistic AP VP Percent: 9 %
Brady Statistic AP VS Percent: 88 %
Brady Statistic AS VP Percent: 1 %
Brady Statistic AS VS Percent: 2 %
Date Time Interrogation Session: 20211202100318
Implantable Lead Implant Date: 20090316
Implantable Lead Implant Date: 20090316
Implantable Lead Location: 753859
Implantable Lead Location: 753860
Implantable Lead Model: 5076
Implantable Lead Model: 5076
Implantable Pulse Generator Implant Date: 20090316
Lead Channel Impedance Value: 407 Ohm
Lead Channel Impedance Value: 531 Ohm
Lead Channel Pacing Threshold Amplitude: 0.5 V
Lead Channel Pacing Threshold Amplitude: 0.625 V
Lead Channel Pacing Threshold Pulse Width: 0.4 ms
Lead Channel Pacing Threshold Pulse Width: 0.4 ms
Lead Channel Setting Pacing Amplitude: 2 V
Lead Channel Setting Pacing Amplitude: 2.5 V
Lead Channel Setting Pacing Pulse Width: 0.4 ms
Lead Channel Setting Sensing Sensitivity: 5.6 mV

## 2019-12-08 ENCOUNTER — Encounter: Payer: Self-pay | Admitting: Internal Medicine

## 2019-12-08 ENCOUNTER — Ambulatory Visit (INDEPENDENT_AMBULATORY_CARE_PROVIDER_SITE_OTHER): Payer: No Typology Code available for payment source | Admitting: Internal Medicine

## 2019-12-08 ENCOUNTER — Other Ambulatory Visit: Payer: Self-pay

## 2019-12-08 VITALS — BP 118/82 | HR 82 | Ht 72.0 in | Wt 217.6 lb

## 2019-12-08 DIAGNOSIS — I428 Other cardiomyopathies: Secondary | ICD-10-CM | POA: Diagnosis not present

## 2019-12-08 DIAGNOSIS — Z950 Presence of cardiac pacemaker: Secondary | ICD-10-CM | POA: Diagnosis not present

## 2019-12-08 DIAGNOSIS — I5022 Chronic systolic (congestive) heart failure: Secondary | ICD-10-CM | POA: Diagnosis not present

## 2019-12-08 DIAGNOSIS — I493 Ventricular premature depolarization: Secondary | ICD-10-CM

## 2019-12-08 NOTE — Progress Notes (Signed)
o     Patient Care Team: Vernie Shanks, MD as PCP - General (Family Medicine) Fay Records, MD as PCP - Cardiology (Cardiology) Deboraha Sprang, MD as PCP - Electrophysiology (Cardiology)   HPI  Patrick Weaver is a 74 y.o. male seen in followup for pacer (Medtronic) implantation for bradycardia. Significant problems with PVCs treated initially with flecanide with much improvement. As noted below,   interval decrease in left ventricular function  prompted the discontinuation of flecainide and  initiation of amiodarone. PVCs were obliterated but without interval improvement in LVEF. Hence he underwent catheterization.      DATE TEST EF   8/15 Echo  65 %   7/17  Echo   25 % LAE (52/2.5/35)  12/17 Echo  25% AI  mild   2/18 Cath  No obstructive CAD  8/18 Echo  20-25%    12/18 CT Aorta  4.5 cm Aneruysm  9/19 Echo  25-30% LAE severe (57/2.6/28)  10/21 Echo  35% AoRoot 46 mm    Date Cr TSH LFTs PFTs  10/17   3.19 29    5/18  1.37 2.47 18 DLCO stable  9/19 1.28 2.72 17   10/21 1.26 (4/21) 4.29 13    He is now retired.  No chest pain; dyspnea with hill climbing.  No edema No pnd orthopnea  Pancreatic mass identified which has had to undergo endoscopic surveillance rather than MRI 2/2  Device     Past Medical History:  Diagnosis Date  . Aneurysm of thoracic aorta ([place]) 07/06/2008   CT 12/18: Ascending aorta 4.5 cm, aortic root 4.6 cm , approximately 4.3 cm  (05/04/19)         . Bladder stone   . BPH (benign prostatic hyperplasia)   . Bradycardia    a.s/p MDT dual chamber pacemaker  . Chronic systolic heart failure ([place]) 08/13/2015   Echo 10/18: Moderate LVH, EF 25, mild AI, aortic annulus dilated at 53 mm, sinotubular junction is 34 mm, proximal ascending aorta is 39 mm // Echo 12/17: Mild LVH, EF 25-30, diffuse HK, mild AI, mild MR, severe LAE, mild RAE, PASP 42 // Echo 7/17: EF 25-30, severe diffuse HK (disproportionately severe HK of the inferolateral and inferior  myocardium), mild to moderate AI, aortic root 44 mm, ascend  . Dyslipidemia    failed niaspan  . History of echocardiogram    Echo 09/26/2017: EF 25-30, normal wall motion, grade 2 diastolic dysfunction, mild AI, severe LAE, mildly reduced RVSF  . HTN (hypertension)   . NICM (nonischemic cardiomyopathy) ([place])    LHC2/18: no angiographic CAD  . PVCs (premature ventricular contractions)    controlled on Amiodarone // Holter 11/17: PVCs 1.8%  . Sleep apnea    USES C-PAP    Past Surgical History:  Procedure Laterality Date  . CYSTOSCOPY WITH LITHOLAPAXY N/A 07/12/2014   Procedure: CYSTOSCOPY WITH LITHOLAPAXY;  Surgeon: Raynelle Bring, MD;  [facility];  Service: Urology;  Laterality: N/A;  . EP IMPLANTABLE DEVICE  2009  . LEFT HEART CATH AND CORONARY ANGIOGRAPHY N/A 02/15/2016   Procedure: Left Heart Cath and Coronary Angiography;  Surgeon: Burnell Blanks, MD;  Location: [facility];  Service: Cardiovascular;  Laterality: N/A;  . TONSILLECTOMY    . TRANSURETHRAL RESECTION OF PROSTATE N/A 07/12/2014   Procedure: TRANSURETHRAL RESECTION OF THE PROSTATE (TURP);  Surgeon: Raynelle Bring, MD;  [facility];  Service: Urology;  Laterality: N/A;    Current Outpatient Medications  Medication Sig Dispense Refill  . acetaminophen (TYLENOL) 325 MG tablet Take 2 tablets (650 mg total) by mouth every 6 (six) hours as needed for mild pain (or Fever >/= 101). 30 tablet 0  . amiodarone (PACERONE) 200 MG tablet Take one tablet by mouth once daily except Sundays. 90 tablet 3  . atorvastatin (LIPITOR) 10 MG tablet TAKE 1 TABLET BY MOUTH EVERY DAY 90 tablet 3  . carvedilol (COREG) 6.25 MG tablet TAKE 1 TABLET BY MOUTH TWICE A DAY 180 tablet 2  . docusate sodium (COLACE) 100 MG capsule Take 100 mg by mouth daily as needed for mild constipation or moderate constipation.    Marland Kitchen ENTRESTO 24-26 MG TAKE 1 TABLET BY MOUTH TWICE A DAY. PLEASE KEEP APPOINTMENT 180 tablet 3  . finasteride (PROSCAR) 5  MG tablet Take 5 mg by mouth daily.  11  . gabapentin (NEURONTIN) 300 MG capsule Take 600 mg by mouth 2 (two) times daily.     Marland Kitchen spironolactone (ALDACTONE) 25 MG tablet TAKE 1/2 TABLET DAILY. MAKE APPT WITH DR Caryl Comes FOR MORE REFILLS 45 tablet 2  . tamsulosin (FLOMAX) 0.4 MG CAPS capsule Take 0.4 mg by mouth daily as needed.      No current facility-administered medications for this visit.    Allergies  Allergen Reactions  . Codeine Nausea Only    Review of Systems negative except from HPI and PMH  Physical Exam There were no vitals taken for this visit. Well developed and well nourished in no acute distress HENT normal Neck supple with JVP-flat Clear Device pocket well healed; without hematoma or erythema.  There is no tethering  Regular rate and rhythm, no  murmur Abd-soft with active BS No Clubbing cyanosis   edema Skin-warm and dry A & Oriented  Grossly normal sensory and motor function  ECG  A paced with long antegrade conduction. Frequent PVCs that are interpolated between the a paced event and the normal intrinsically conducted AV interval. Asssessment and  Plan  Sinus node dysfunction  Pacemaker-Medtronic    PVCs on amiodarone event recorder from 10/21 was reviewed. Approximately 6% PVCs  HTN  Renal insufficiency grade 3  NICM  First-degree AV block  Congestive heart failure-chronic-systolic-class 2B-three aa  Aortic root dilatation with central AI  High Risk Medication Surveillance Amiodarone   Pancreatic mass    PVCs are about 6+ percent. On amiodarone. Continue amiodarone. Surveillance laboratories are within range.  We will review with colleagues the role of possible catheter ablation  Euvolemic. Continue current medications. He may be a candidate for SGLT2 inhibitor. We will asked Dr. [name].  Blood pressure well controlled  Has been identified with a pancreatic mass surveillance for which would be ideal with MRI. Is device generator is  approaching ERI, about 15 months. He is scheduled to be reevaluated 9/22. We will see him in 8/22 with anticipation that we would change his generator out to make it MRI compatible for surveillance of his pancreatic mass.

## 2019-12-08 NOTE — Patient Instructions (Signed)
Medication Instructions:  Your physician recommends that you continue on your current medications as directed. Please refer to the Current Medication list given to you today.  *If you need a refill on your cardiac medications before your next appointment, please call your pharmacy*   Lab Work: None ordered.  If you have labs (blood work) drawn today and your tests are completely normal, you will receive your results only by: Marland Kitchen MyChart Message (if you have MyChart) OR . A paper copy in the mail If you have any lab test that is abnormal or we need to change your treatment, we will call you to review the results.   Testing/Procedures: None ordered.    Follow-Up: At [facility], you and your health needs are our priority.  As part of our continuing mission to provide you with exceptional heart care, we have created designated Provider Care Teams.  These Care Teams include your primary Cardiologist (physician) and Advanced Practice Providers (APPs -  Physician Assistants and Nurse Practitioners) who all work together to provide you with the care you need, when you need it.  We recommend signing up for the patient portal called "MyChart".  Sign up information is provided on this After Visit Summary.  MyChart is used to connect with patients for Virtual Visits (Telemedicine).  Patients are able to view lab/test results, encounter notes, upcoming appointments, etc.  Non-urgent messages can be sent to your provider as well.   To learn more about what you can do with MyChart, go to [email or web address].    Your next appointment:   August 2022 with Dr Caryl Comes.  You should receive a reminder to call and schedule appointment 2 months prior.

## 2019-12-10 NOTE — Progress Notes (Signed)
Remote pacemaker transmission.   

## 2019-12-16 LAB — CUP PACEART INCLINIC DEVICE CHECK
Battery Impedance: 3333 Ohm
Battery Remaining Longevity: 16 mo
Battery Voltage: 2.71 V
Brady Statistic AP VP Percent: 9 %
Brady Statistic AP VS Percent: 88 %
Brady Statistic AS VP Percent: 1 %
Brady Statistic AS VS Percent: 2 %
Date Time Interrogation Session: 20211207135700
Implantable Lead Implant Date: 20090316
Implantable Lead Implant Date: 20090316
Implantable Lead Location: 753859
Implantable Lead Location: 753860
Implantable Lead Model: 5076
Implantable Lead Model: 5076
Implantable Pulse Generator Implant Date: 20090316
Lead Channel Impedance Value: 373 Ohm
Lead Channel Impedance Value: 502 Ohm
Lead Channel Pacing Threshold Amplitude: 0.5 V
Lead Channel Pacing Threshold Amplitude: 0.75 V
Lead Channel Pacing Threshold Amplitude: 0.75 V
Lead Channel Pacing Threshold Amplitude: 1 V
Lead Channel Pacing Threshold Pulse Width: 0.4 ms
Lead Channel Pacing Threshold Pulse Width: 0.4 ms
Lead Channel Pacing Threshold Pulse Width: 0.4 ms
Lead Channel Pacing Threshold Pulse Width: 0.4 ms
Lead Channel Sensing Intrinsic Amplitude: 1.4 mV
Lead Channel Sensing Intrinsic Amplitude: 8 mV
Lead Channel Setting Pacing Amplitude: 2 V
Lead Channel Setting Pacing Amplitude: 2.5 V
Lead Channel Setting Pacing Pulse Width: 0.4 ms
Lead Channel Setting Sensing Sensitivity: 4 mV

## 2019-12-28 ENCOUNTER — Other Ambulatory Visit: Payer: Self-pay

## 2019-12-28 ENCOUNTER — Telehealth: Payer: Self-pay | Admitting: Internal Medicine

## 2019-12-28 ENCOUNTER — Telehealth: Payer: Self-pay

## 2019-12-28 ENCOUNTER — Other Ambulatory Visit: Payer: No Typology Code available for payment source | Admitting: *Deleted

## 2019-12-28 DIAGNOSIS — I5022 Chronic systolic (congestive) heart failure: Secondary | ICD-10-CM

## 2019-12-28 DIAGNOSIS — Z79899 Other long term (current) drug therapy: Secondary | ICD-10-CM

## 2019-12-28 NOTE — Telephone Encounter (Signed)
Thank you.   Device clinic can forward readings  He did note more fluttering

## 2019-12-28 NOTE — Telephone Encounter (Signed)
Pt c/o Shortness Of Breath: STAT if SOB developed within the last 24 hours or pt is noticeably SOB on the phone  1. Are you currently SOB (can you hear that pt is SOB on the phone)? Yes and I can hear the patient  2. How long have you been experiencing SOB? About 2days  3. Are you SOB when sitting or when up moving around? All the time  4. Are you currently experiencing any other symptoms? Not right now

## 2019-12-28 NOTE — Telephone Encounter (Signed)
Patient is calling in, audibly SOB on the phone regarding his worsening SOB over the last two days. He states he has gained about 3 pounds in the last few days. He has not been weighing himself daily. He thinks he may have some additional fluid but does not see any swelling in extremeites or abdomen. He denies any N/V, CP. He states his SOB is worse on exertion. He has been taking all medications as usual. He does report eating extra sodium over the weekend and indulging in things like ham and sausage that he does not normally eat.

## 2019-12-28 NOTE — Telephone Encounter (Signed)
Spoke to pateint   He is SOB   Sats 97% on RA   Thinks fluid is up  Admits to eating saltier foods   Recomm:  Would like for him to get a remote check of impedence.   He is feeling heart flutter more he says as well BMET, BNP and CBC Has never been on lasix before but he will probably need   WOuld like to see abvoe 2 results before prescribing but may indeed need to first Discussed salt and limiting

## 2019-12-28 NOTE — Telephone Encounter (Signed)
Note:   Please change labs to :  CBC, CMET, TSH, BNP   Pt is on amiodarone Pt coming in today  Thank you

## 2019-12-28 NOTE — Telephone Encounter (Signed)
Calling patient to request manual transmission to assess for Optivol per Dr. Tenny Craw.   Left message on answering machine to send manual transmission per DPR. Advised to call if assistance is needed.

## 2019-12-29 ENCOUNTER — Telehealth: Payer: Self-pay | Admitting: *Deleted

## 2019-12-29 LAB — COMPREHENSIVE METABOLIC PANEL
ALT: 18 IU/L (ref 0–44)
AST: 14 IU/L (ref 0–40)
Albumin/Globulin Ratio: 2.4 — ABNORMAL HIGH (ref 1.2–2.2)
Albumin: 4.6 g/dL (ref 3.7–4.7)
Alkaline Phosphatase: 88 IU/L (ref 44–121)
BUN/Creatinine Ratio: 9 — ABNORMAL LOW (ref 10–24)
BUN: 11 mg/dL (ref 8–27)
Bilirubin Total: 0.5 mg/dL (ref 0.0–1.2)
CO2: 24 mmol/L (ref 20–29)
Calcium: 9.5 mg/dL (ref 8.6–10.2)
Chloride: 105 mmol/L (ref 96–106)
Creatinine, Ser: 1.21 mg/dL (ref 0.76–1.27)
GFR calc Af Amer: 69 mL/min/{1.73_m2} (ref >59)
GFR calc non Af Amer: 59 mL/min/{1.73_m2} — ABNORMAL LOW (ref >59)
Globulin, Total: 1.9 g/dL (ref 1.5–4.5)
Glucose: 91 mg/dL (ref 65–99)
Potassium: 4.1 mmol/L (ref 3.5–5.2)
Sodium: 143 mmol/L (ref 134–144)
Total Protein: 6.5 g/dL (ref 6.0–8.5)

## 2019-12-29 LAB — SPECIMEN STATUS

## 2019-12-29 LAB — TSH: TSH: 4.04 u[IU]/mL (ref 0.450–4.500)

## 2019-12-29 MED ORDER — FUROSEMIDE 40 MG PO TABS
ORAL_TABLET | ORAL | 0 refills | Status: DC
Start: 2019-12-29 — End: 2020-02-19

## 2019-12-29 MED ORDER — POTASSIUM CHLORIDE ER 10 MEQ PO TBCR
EXTENDED_RELEASE_TABLET | ORAL | 0 refills | Status: DC
Start: 2019-12-29 — End: 2020-02-19

## 2019-12-29 NOTE — Telephone Encounter (Signed)
-----   Message from Pricilla Riffle, MD sent at 12/29/2019  8:51 AM EST ----- Labs are still not fully back     Thyroid normal  Electrolytes and kidney function are OK  Liver function is OK CBC is pending  Pt with wt gain and SOB    I would recomm a trial of Lasix 40 mg with 10 KCL   Take every day for 3 days the 2x per wk   May be able to back off   Watch weight daily   Limit salt

## 2019-12-29 NOTE — Telephone Encounter (Signed)
Spoke with the pt and endorsed to him, his lab results and recommendations per Dr. Tenny Craw.  Informed the pt that per Dr. Tenny Craw, she would like to start him on a trial of lasix 40 mg po daily for 3 days only, then take lasix 40 mg po daily, two times weekly thereafter.  He will take KDUR 10 mEq po daily for 3 days only, then take KDUR 10 mEq po daily, two times weekly thereafter, same days as he administers his lasix. Informed the pt that Dr. Tenny Craw states we may be able to back off this regimen, depending on his symptoms and weights. Phoned these 2 prescriptions into the pts confirmed pharmacy of choice, and spoke with Pharmacist Selena Batten, at [facility], to provide verbal orders.  Advised the pt that per Dr Tenny Craw, she wants him to dry weigh himself daily (weight parameters educated to the pt), and limit his salt intake.  Informed the pt that his CBC is still pending, and once those results come back, we will call him thereafter.  Pt verbalized understanding and agrees with this plan.

## 2019-12-30 LAB — CBC WITH DIFFERENTIAL/PLATELET
Basophils Absolute: 0.1 10*3/uL (ref 0.0–0.2)
Basos: 1 %
EOS (ABSOLUTE): 0.3 10*3/uL (ref 0.0–0.4)
Eos: 4 %
Hematocrit: 42.5 % (ref 37.5–51.0)
Hemoglobin: 14.2 g/dL (ref 13.0–17.7)
Immature Grans (Abs): 0 10*3/uL (ref 0.0–0.1)
Immature Granulocytes: 0 %
Lymphocytes Absolute: 1.3 10*3/uL (ref 0.7–3.1)
Lymphs: 19 %
MCH: 30.3 pg (ref 26.6–33.0)
MCHC: 33.4 g/dL (ref 31.5–35.7)
MCV: 91 fL (ref 79–97)
Monocytes Absolute: 0.4 10*3/uL (ref 0.1–0.9)
Monocytes: 6 %
Neutrophils Absolute: 4.8 10*3/uL (ref 1.4–7.0)
Neutrophils: 70 %
Platelets: 154 10*3/uL (ref 150–450)
RBC: 4.68 x10E6/uL (ref 4.14–5.80)
RDW: 13.2 % (ref 11.6–15.4)
WBC: 6.9 10*3/uL (ref 3.4–10.8)

## 2019-12-30 LAB — SPECIMEN STATUS REPORT

## 2019-12-30 LAB — PRO B NATRIURETIC PEPTIDE: NT-Pro BNP: 1183 pg/mL — ABNORMAL HIGH (ref 0–376)

## 2019-12-31 NOTE — Telephone Encounter (Signed)
Pt should be set up for BMET and BNP in about 2 wks if not set up already

## 2019-12-31 NOTE — Telephone Encounter (Signed)
Remote transmission received. AP/Vs presenting EGM, Adapta pacemaker that does not have Optivol feature, no atrial high rates, no high v-rates.

## 2020-01-04 NOTE — Addendum Note (Signed)
Addended by: Lendon Ka on: 01/04/2020 09:46 AM   Modules accepted: Orders

## 2020-01-04 NOTE — Telephone Encounter (Signed)
Spoke with patient.  He feels his breathing is improving.  His weight is down 3-4 pounds.  Will come on 01/15/20 for labs.  Will call if weight increases, breathing worsens.  Taking lasix one tablet (40 mg) two times a week.

## 2020-01-15 ENCOUNTER — Other Ambulatory Visit: Payer: No Typology Code available for payment source | Admitting: *Deleted

## 2020-01-15 ENCOUNTER — Other Ambulatory Visit: Payer: Self-pay

## 2020-01-15 DIAGNOSIS — I5022 Chronic systolic (congestive) heart failure: Secondary | ICD-10-CM

## 2020-01-16 LAB — BASIC METABOLIC PANEL
BUN/Creatinine Ratio: 12 (ref 10–24)
BUN: 19 mg/dL (ref 8–27)
CO2: 24 mmol/L (ref 20–29)
Calcium: 9.9 mg/dL (ref 8.6–10.2)
Chloride: 103 mmol/L (ref 96–106)
Creatinine, Ser: 1.58 mg/dL — ABNORMAL HIGH (ref 0.76–1.27)
GFR calc Af Amer: 50 mL/min/{1.73_m2} — ABNORMAL LOW (ref >59)
GFR calc non Af Amer: 43 mL/min/{1.73_m2} — ABNORMAL LOW (ref >59)
Glucose: 96 mg/dL (ref 65–99)
Potassium: 4.4 mmol/L (ref 3.5–5.2)
Sodium: 143 mmol/L (ref 134–144)

## 2020-01-16 LAB — PRO B NATRIURETIC PEPTIDE: NT-Pro BNP: 195 pg/mL (ref 0–376)

## 2020-01-18 ENCOUNTER — Telehealth: Payer: Self-pay | Admitting: *Deleted

## 2020-01-18 DIAGNOSIS — I5022 Chronic systolic (congestive) heart failure: Secondary | ICD-10-CM

## 2020-01-18 NOTE — Telephone Encounter (Signed)
-----   Message from Dorris Carnes V, MD sent at 01/17/2020  9:24 PM EST ----- Fluid number is much better   Normal Cr up slightly 1.58 Cut back on lasix and K to 1x per week    Keep watching/limiting Na Check BMET and BNP in 3  wks

## 2020-01-18 NOTE — Telephone Encounter (Signed)
Pt has been notified of lab results and recommendations for repeat labs in 3 weeks bmet, pro bnp. Pt is agreeable to plan of care. Pt aware to limit Na intake. Pt has been advised to call the office if he has increased sob or wt gain of 2-3 lb's x 1 day or 5 lb's x 1 week. Pt is agreeable to plan of care with verbal understanding. Lab orders and appt have been made. Patient notified of result.  Please refer to phone note from today for complete details.   Julaine Hua, CMA 01/18/2020 9:15 AM

## 2020-02-08 ENCOUNTER — Other Ambulatory Visit: Payer: No Typology Code available for payment source | Admitting: *Deleted

## 2020-02-08 ENCOUNTER — Other Ambulatory Visit: Payer: Self-pay

## 2020-02-08 DIAGNOSIS — I5022 Chronic systolic (congestive) heart failure: Secondary | ICD-10-CM

## 2020-02-08 LAB — BASIC METABOLIC PANEL
BUN/Creatinine Ratio: 14 (ref 10–24)
BUN: 17 mg/dL (ref 8–27)
CO2: 23 mmol/L (ref 20–29)
Calcium: 9.2 mg/dL (ref 8.6–10.2)
Chloride: 104 mmol/L (ref 96–106)
Creatinine, Ser: 1.25 mg/dL (ref 0.76–1.27)
GFR calc Af Amer: 66 mL/min/{1.73_m2} (ref >59)
GFR calc non Af Amer: 57 mL/min/{1.73_m2} — ABNORMAL LOW (ref >59)
Glucose: 87 mg/dL (ref 65–99)
Potassium: 4.3 mmol/L (ref 3.5–5.2)
Sodium: 142 mmol/L (ref 134–144)

## 2020-02-08 LAB — PRO B NATRIURETIC PEPTIDE: NT-Pro BNP: 316 pg/mL (ref 0–376)

## 2020-02-14 ENCOUNTER — Other Ambulatory Visit: Payer: Self-pay | Admitting: Internal Medicine

## 2020-02-19 MED ORDER — FUROSEMIDE 40 MG PO TABS: ORAL_TABLET | ORAL | 3 refills | Status: DC

## 2020-02-19 MED ORDER — POTASSIUM CHLORIDE ER 10 MEQ PO TBCR: EXTENDED_RELEASE_TABLET | ORAL | 3 refills | Status: DC

## 2020-03-03 ENCOUNTER — Ambulatory Visit (INDEPENDENT_AMBULATORY_CARE_PROVIDER_SITE_OTHER): Payer: Medicare HMO

## 2020-03-03 DIAGNOSIS — I428 Other cardiomyopathies: Secondary | ICD-10-CM

## 2020-03-07 LAB — CUP PACEART REMOTE DEVICE CHECK
Battery Impedance: 3981 Ohm
Battery Remaining Longevity: 11 mo
Battery Voltage: 2.7 V
Brady Statistic AP VP Percent: 8 %
Brady Statistic AP VS Percent: 90 %
Brady Statistic AS VP Percent: 1 %
Brady Statistic AS VS Percent: 1 %
Date Time Interrogation Session: 20220303092737
Implantable Lead Implant Date: 20090316
Implantable Lead Implant Date: 20090316
Implantable Lead Location: 753859
Implantable Lead Location: 753860
Implantable Lead Model: 5076
Implantable Lead Model: 5076
Implantable Pulse Generator Implant Date: 20090316
Lead Channel Impedance Value: 371 Ohm
Lead Channel Impedance Value: 507 Ohm
Lead Channel Pacing Threshold Amplitude: 0.5 V
Lead Channel Pacing Threshold Amplitude: 0.75 V
Lead Channel Pacing Threshold Pulse Width: 0.4 ms
Lead Channel Pacing Threshold Pulse Width: 0.4 ms
Lead Channel Setting Pacing Amplitude: 2 V
Lead Channel Setting Pacing Amplitude: 2.5 V
Lead Channel Setting Pacing Pulse Width: 0.4 ms
Lead Channel Setting Sensing Sensitivity: 4 mV

## 2020-03-14 NOTE — Progress Notes (Signed)
Remote pacemaker transmission.   

## 2020-03-21 DIAGNOSIS — H5201 Hypermetropia, right eye: Secondary | ICD-10-CM | POA: Diagnosis not present

## 2020-03-21 DIAGNOSIS — H52203 Unspecified astigmatism, bilateral: Secondary | ICD-10-CM | POA: Diagnosis not present

## 2020-03-21 DIAGNOSIS — H2513 Age-related nuclear cataract, bilateral: Secondary | ICD-10-CM | POA: Diagnosis not present

## 2020-03-21 DIAGNOSIS — H524 Presbyopia: Secondary | ICD-10-CM | POA: Diagnosis not present

## 2020-03-21 DIAGNOSIS — H5212 Myopia, left eye: Secondary | ICD-10-CM | POA: Diagnosis not present

## 2020-03-21 DIAGNOSIS — H35373 Puckering of macula, bilateral: Secondary | ICD-10-CM | POA: Diagnosis not present

## 2020-04-06 ENCOUNTER — Ambulatory Visit (INDEPENDENT_AMBULATORY_CARE_PROVIDER_SITE_OTHER): Payer: Medicare HMO

## 2020-04-06 DIAGNOSIS — I428 Other cardiomyopathies: Secondary | ICD-10-CM

## 2020-04-07 LAB — CUP PACEART REMOTE DEVICE CHECK
Battery Impedance: 4061 Ohm
Battery Remaining Longevity: 10 mo
Battery Voltage: 2.69 V
Brady Statistic AP VP Percent: 7 %
Brady Statistic AP VS Percent: 92 %
Brady Statistic AS VP Percent: 0 %
Brady Statistic AS VS Percent: 1 %
Date Time Interrogation Session: 20220406092828
Implantable Lead Implant Date: 20090316
Implantable Lead Implant Date: 20090316
Implantable Lead Location: 753859
Implantable Lead Location: 753860
Implantable Lead Model: 5076
Implantable Lead Model: 5076
Implantable Pulse Generator Implant Date: 20090316
Lead Channel Impedance Value: 376 Ohm
Lead Channel Impedance Value: 544 Ohm
Lead Channel Pacing Threshold Amplitude: 0.5 V
Lead Channel Pacing Threshold Amplitude: 0.75 V
Lead Channel Pacing Threshold Pulse Width: 0.4 ms
Lead Channel Pacing Threshold Pulse Width: 0.4 ms
Lead Channel Setting Pacing Amplitude: 2 V
Lead Channel Setting Pacing Amplitude: 2.5 V
Lead Channel Setting Pacing Pulse Width: 0.4 ms
Lead Channel Setting Sensing Sensitivity: 4 mV

## 2020-04-18 NOTE — Progress Notes (Signed)
Remote pacemaker transmission.   

## 2020-04-18 NOTE — Addendum Note (Signed)
Addended by: Cheri Kearns A on: 04/18/2020 11:49 AM   Modules accepted: Level of Service

## 2020-05-05 DIAGNOSIS — Z9989 Dependence on other enabling machines and devices: Secondary | ICD-10-CM | POA: Diagnosis not present

## 2020-05-05 DIAGNOSIS — N1831 Chronic kidney disease, stage 3a: Secondary | ICD-10-CM | POA: Diagnosis not present

## 2020-05-05 DIAGNOSIS — I429 Cardiomyopathy, unspecified: Secondary | ICD-10-CM | POA: Diagnosis not present

## 2020-05-05 DIAGNOSIS — R768 Other specified abnormal immunological findings in serum: Secondary | ICD-10-CM | POA: Diagnosis not present

## 2020-05-05 DIAGNOSIS — I351 Nonrheumatic aortic (valve) insufficiency: Secondary | ICD-10-CM | POA: Diagnosis not present

## 2020-05-05 DIAGNOSIS — I712 Thoracic aortic aneurysm, without rupture: Secondary | ICD-10-CM | POA: Diagnosis not present

## 2020-05-05 DIAGNOSIS — R310 Gross hematuria: Secondary | ICD-10-CM | POA: Diagnosis not present

## 2020-05-05 DIAGNOSIS — E785 Hyperlipidemia, unspecified: Secondary | ICD-10-CM | POA: Diagnosis not present

## 2020-05-05 DIAGNOSIS — I129 Hypertensive chronic kidney disease with stage 1 through stage 4 chronic kidney disease, or unspecified chronic kidney disease: Secondary | ICD-10-CM | POA: Diagnosis not present

## 2020-05-09 ENCOUNTER — Ambulatory Visit (INDEPENDENT_AMBULATORY_CARE_PROVIDER_SITE_OTHER): Payer: Medicare HMO

## 2020-05-09 DIAGNOSIS — I428 Other cardiomyopathies: Secondary | ICD-10-CM

## 2020-05-09 LAB — CUP PACEART REMOTE DEVICE CHECK
Battery Impedance: 4366 Ohm
Battery Remaining Longevity: 9 mo
Battery Voltage: 2.69 V
Brady Statistic AP VP Percent: 6 %
Brady Statistic AP VS Percent: 93 %
Brady Statistic AS VP Percent: 0 %
Brady Statistic AS VS Percent: 1 %
Date Time Interrogation Session: 20220509090347
Implantable Lead Implant Date: 20090316
Implantable Lead Implant Date: 20090316
Implantable Lead Location: 753859
Implantable Lead Location: 753860
Implantable Lead Model: 5076
Implantable Lead Model: 5076
Implantable Pulse Generator Implant Date: 20090316
Lead Channel Impedance Value: 430 Ohm
Lead Channel Impedance Value: 555 Ohm
Lead Channel Pacing Threshold Amplitude: 0.5 V
Lead Channel Pacing Threshold Amplitude: 0.75 V
Lead Channel Pacing Threshold Pulse Width: 0.4 ms
Lead Channel Pacing Threshold Pulse Width: 0.4 ms
Lead Channel Setting Pacing Amplitude: 2 V
Lead Channel Setting Pacing Amplitude: 2.5 V
Lead Channel Setting Pacing Pulse Width: 0.4 ms
Lead Channel Setting Sensing Sensitivity: 4 mV

## 2020-05-24 ENCOUNTER — Encounter: Payer: Self-pay | Admitting: Internal Medicine

## 2020-05-27 NOTE — Progress Notes (Signed)
Remote pacemaker transmission.   

## 2020-06-09 ENCOUNTER — Ambulatory Visit (INDEPENDENT_AMBULATORY_CARE_PROVIDER_SITE_OTHER): Payer: Medicare HMO

## 2020-06-09 DIAGNOSIS — I428 Other cardiomyopathies: Secondary | ICD-10-CM

## 2020-06-10 LAB — CUP PACEART REMOTE DEVICE CHECK
Battery Impedance: 4397 Ohm
Battery Remaining Longevity: 8 mo
Battery Voltage: 2.68 V
Brady Statistic AP VP Percent: 5 %
Brady Statistic AP VS Percent: 94 %
Brady Statistic AS VP Percent: 0 %
Brady Statistic AS VS Percent: 1 %
Date Time Interrogation Session: 20220609164330
Implantable Lead Implant Date: 20090316
Implantable Lead Implant Date: 20090316
Implantable Lead Location: 753859
Implantable Lead Location: 753860
Implantable Lead Model: 5076
Implantable Lead Model: 5076
Implantable Pulse Generator Implant Date: 20090316
Lead Channel Impedance Value: 399 Ohm
Lead Channel Impedance Value: 510 Ohm
Lead Channel Pacing Threshold Amplitude: 0.5 V
Lead Channel Pacing Threshold Amplitude: 0.875 V
Lead Channel Pacing Threshold Pulse Width: 0.4 ms
Lead Channel Pacing Threshold Pulse Width: 0.4 ms
Lead Channel Setting Pacing Amplitude: 2 V
Lead Channel Setting Pacing Amplitude: 2.5 V
Lead Channel Setting Pacing Pulse Width: 0.4 ms
Lead Channel Setting Sensing Sensitivity: 4 mV

## 2020-06-28 NOTE — Progress Notes (Signed)
Monthly battery checks scheduled.

## 2020-07-11 ENCOUNTER — Ambulatory Visit (INDEPENDENT_AMBULATORY_CARE_PROVIDER_SITE_OTHER): Payer: Medicare HMO

## 2020-07-11 DIAGNOSIS — I428 Other cardiomyopathies: Secondary | ICD-10-CM

## 2020-07-12 LAB — CUP PACEART REMOTE DEVICE CHECK
Battery Impedance: 4828 Ohm
Battery Remaining Longevity: 6 mo
Battery Voltage: 2.66 V
Brady Statistic AP VP Percent: 5 %
Brady Statistic AP VS Percent: 94 %
Brady Statistic AS VP Percent: 0 %
Brady Statistic AS VS Percent: 1 %
Date Time Interrogation Session: 20220712130608
Implantable Lead Implant Date: 20090316
Implantable Lead Implant Date: 20090316
Implantable Lead Location: 753859
Implantable Lead Location: 753860
Implantable Lead Model: 5076
Implantable Lead Model: 5076
Implantable Pulse Generator Implant Date: 20090316
Lead Channel Impedance Value: 406 Ohm
Lead Channel Impedance Value: 558 Ohm
Lead Channel Pacing Threshold Amplitude: 0.5 V
Lead Channel Pacing Threshold Amplitude: 0.625 V
Lead Channel Pacing Threshold Pulse Width: 0.4 ms
Lead Channel Pacing Threshold Pulse Width: 0.4 ms
Lead Channel Setting Pacing Amplitude: 2 V
Lead Channel Setting Pacing Amplitude: 2.5 V
Lead Channel Setting Pacing Pulse Width: 0.4 ms
Lead Channel Setting Sensing Sensitivity: 4 mV

## 2020-07-14 ENCOUNTER — Other Ambulatory Visit: Payer: Self-pay | Admitting: Internal Medicine

## 2020-07-26 IMAGING — CT CT ANGIO CHEST
2 of 12 series · 14 of 46 positions shown · IV contrast (iopamidol)
Comparison: 12/04/2016

CLINICAL DATA: Follow-up thoracic aortic aneurysm

EXAM:
CT ANGIOGRAPHY CHEST WITH CONTRAST
TECHNIQUE: Multidetector CT imaging of the chest was performed using the
standard protocol during bolus administration of intravenous
contrast. Multiplanar CT image reconstructions and MIPs were
obtained to evaluate the vascular anatomy.
CONTRAST:  100mL XXEFXR-5IS IOPAMIDOL (XXEFXR-5IS) INJECTION 76%

[Series 4: aorta 3.0 i31f 2 · axial · 0.71mm/px · z∈[-186,+84]mm · 13 of 106 slices shown]
[im 8/106  lung]
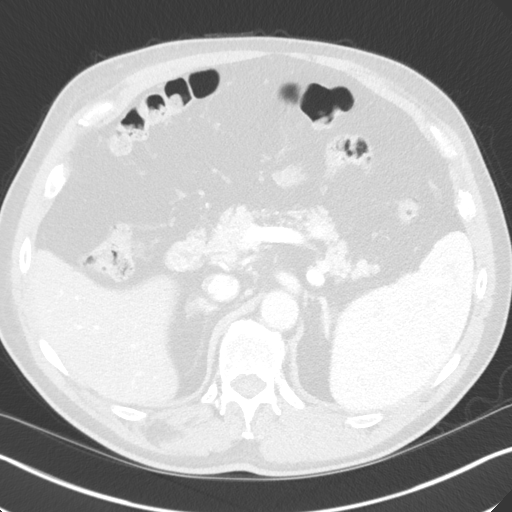
[im 16/106  soft-tissue]
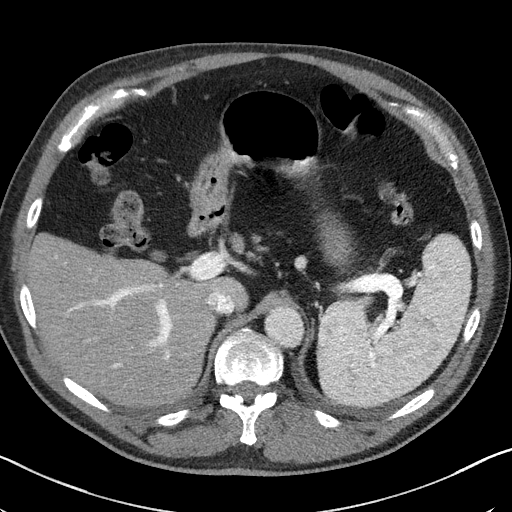
[im 23/106  lung]
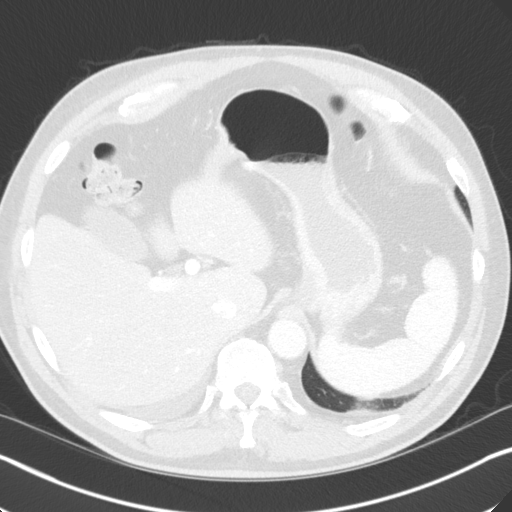
[im 31/106  soft-tissue]
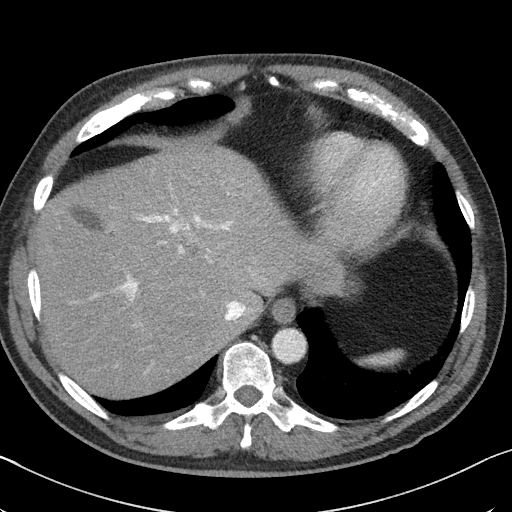
[im 38/106  lung]
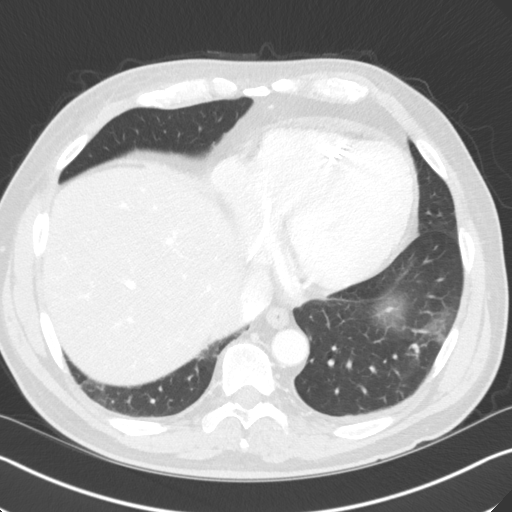
[im 46/106  soft-tissue]
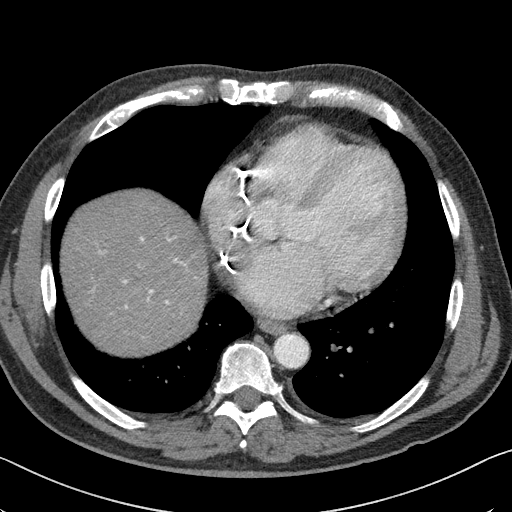
[im 53/106  lung]
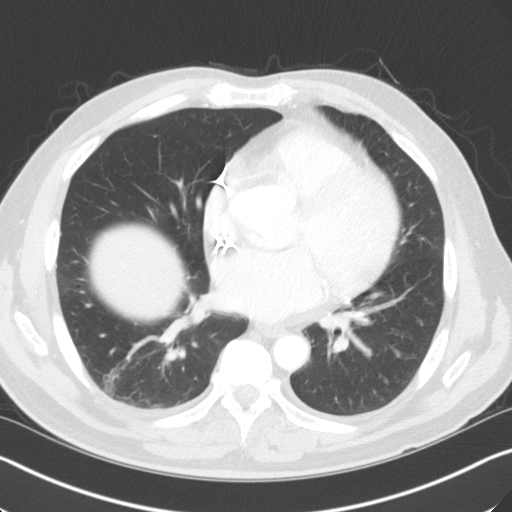
[im 61/106  soft-tissue]
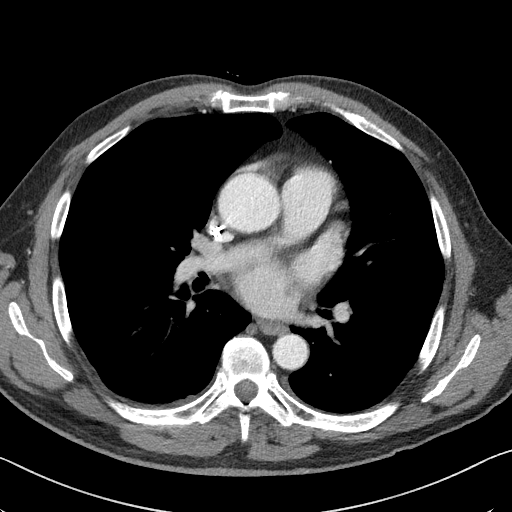
[im 68/106  lung]
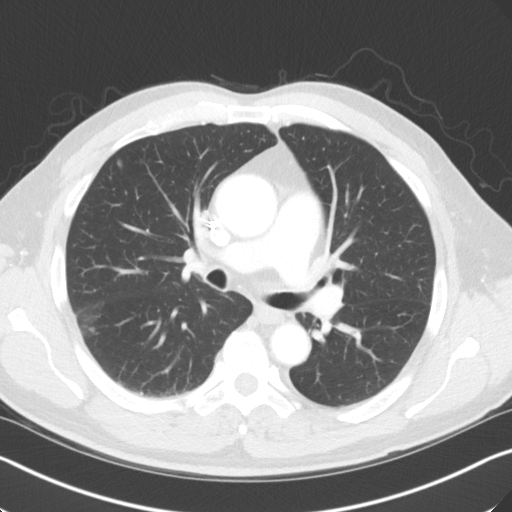
[im 76/106  soft-tissue]
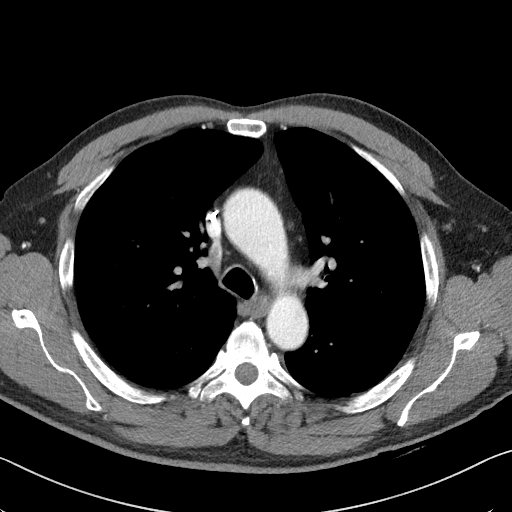
[im 83/106  lung]
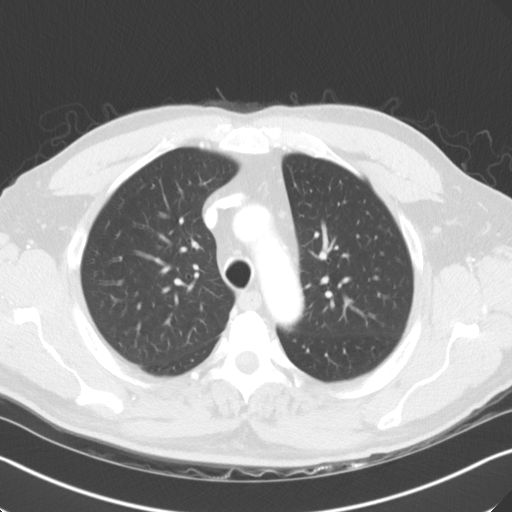
[im 91/106  soft-tissue]
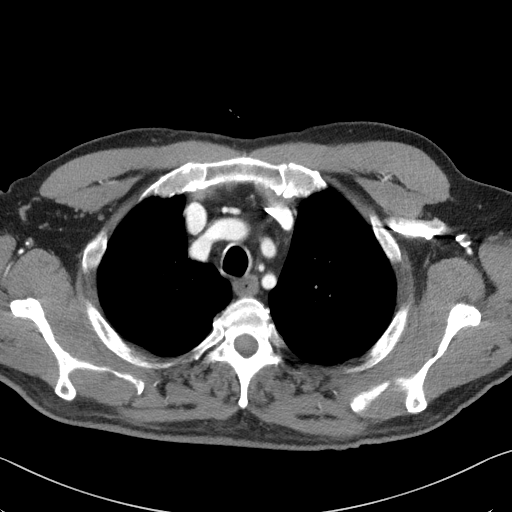
[im 98/106  lung]
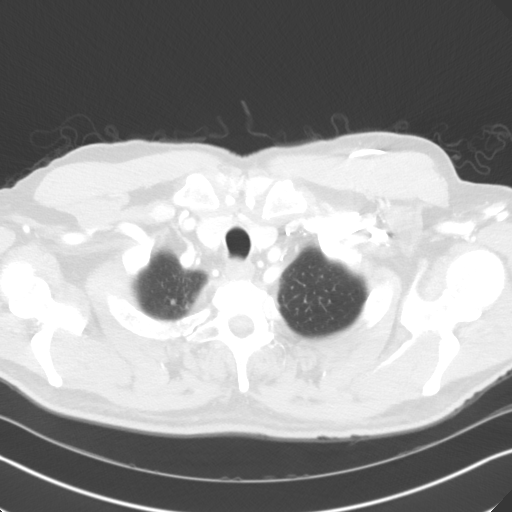

[Series 10: coronals · coronal · 0.67mm/px · 1 of 151 slices shown]
[im 76/151  soft-tissue]
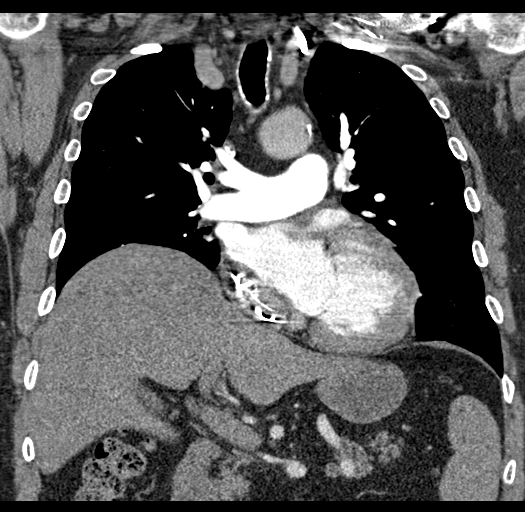

[14 of 46 positions shown; findings below may reference images not displayed]

FINDINGS: Cardiovascular: Maximal diameter in the ascending aorta at the sinus
of Valsalva, Guillermo Raul junction, and ascending aorta are 4.8 cm,
3.8 cm, and 4.3 cm respectively. Maximal diameter of the sinus of
Valsalva and ascending aorta on the prior study were 4.6 and 4.5 cm
respectively. Maximal diameter of the aortic arch is 3.7 cm compared
with 3.8 cm on the prior study. There is no evidence of aortic
dissection or acute intramural hematoma. Atherosclerotic
calcifications in the aortic arch are noted. Great vessels are
patent. Left subclavian pacemaker device and leads are stable. Tips
of the leads are in the right ventricle apex and right atrium.

Mediastinum/Nodes: No abnormal mediastinal adenopathy. Thyroid is
heterogeneous tiny hypodensity towards the base of the right lobe is
stable. Physiologic pericardial fluid is noted.

Lungs/Pleura: No pneumothorax or pleural effusion. Right upper lobe
nodule on image 40 is stable. Subsegmental atelectasis at the lung
bases. Right lower lobe nodule on image 46 is stable. Stable left
upper lobe nodule on image 36.

Upper Abdomen: Scattered small hypodensities in the liver are stable
supporting benign etiology such as cysts. Diffuse hepatic steatosis.

Musculoskeletal: No vertebral compression deformity.

Review of the MIP images confirms the above findings.
IMPRESSION: Aneurysmal dilatation of the ascending aorta is 4.3 cm compared with
4.5 cm on the prior study. Aneurysmal dilatation at the sinus of
Valsalva is 4.8 cm compared with 4.6 cm on the prior study. These
measurements are not significantly changed. Recommend annual imaging
followup by CTA or MRA. This recommendation follows 9262
ACCF/AHA/AATS/ACR/ASA/SCA/KATY/BARNIDO/NASH/AFKARI Guidelines for the
Diagnosis and Management of Patients with Thoracic Aortic Disease.
Circulation. 9262; 121: [id number]

Chronic changes are noted.

Aortic Atherosclerosis ([id number]).

## 2020-07-29 DIAGNOSIS — R972 Elevated prostate specific antigen [PSA]: Secondary | ICD-10-CM | POA: Diagnosis not present

## 2020-07-29 DIAGNOSIS — N401 Enlarged prostate with lower urinary tract symptoms: Secondary | ICD-10-CM | POA: Diagnosis not present

## 2020-07-29 DIAGNOSIS — R3912 Poor urinary stream: Secondary | ICD-10-CM | POA: Diagnosis not present

## 2020-08-02 NOTE — Addendum Note (Signed)
Addended by: Cheri Kearns A on: 08/02/2020 09:02 AM   Modules accepted: Level of Service

## 2020-08-02 NOTE — Progress Notes (Signed)
Remote pacemaker transmission.   

## 2020-08-11 ENCOUNTER — Ambulatory Visit (INDEPENDENT_AMBULATORY_CARE_PROVIDER_SITE_OTHER): Payer: Medicare HMO

## 2020-08-11 ENCOUNTER — Ambulatory Visit (INDEPENDENT_AMBULATORY_CARE_PROVIDER_SITE_OTHER): Payer: Medicare HMO | Admitting: Internal Medicine

## 2020-08-11 ENCOUNTER — Other Ambulatory Visit: Payer: Self-pay

## 2020-08-11 VITALS — BP 100/62 | HR 88 | Ht 72.0 in | Wt 223.0 lb

## 2020-08-11 DIAGNOSIS — Z950 Presence of cardiac pacemaker: Secondary | ICD-10-CM

## 2020-08-11 DIAGNOSIS — I493 Ventricular premature depolarization: Secondary | ICD-10-CM

## 2020-08-11 DIAGNOSIS — I5022 Chronic systolic (congestive) heart failure: Secondary | ICD-10-CM | POA: Diagnosis not present

## 2020-08-11 DIAGNOSIS — I428 Other cardiomyopathies: Secondary | ICD-10-CM

## 2020-08-11 DIAGNOSIS — I495 Sick sinus syndrome: Secondary | ICD-10-CM | POA: Diagnosis not present

## 2020-08-11 DIAGNOSIS — E032 Hypothyroidism due to medicaments and other exogenous substances: Secondary | ICD-10-CM

## 2020-08-11 DIAGNOSIS — I498 Other specified cardiac arrhythmias: Secondary | ICD-10-CM

## 2020-08-11 DIAGNOSIS — Z79899 Other long term (current) drug therapy: Secondary | ICD-10-CM

## 2020-08-11 NOTE — Progress Notes (Signed)
Patient ID: Patrick Weaver, male   DOB: Nov 28, 1945, 75 y.o.   MRN: [id number]     Patient Care Team: Vernie Shanks, MD as PCP - General (Family Medicine) Fay Records, MD as PCP - Cardiology (Cardiology) Deboraha Sprang, MD as PCP - Electrophysiology (Cardiology)   HPI  Patrick Weaver is a 75 y.o. male seen in followup for pacer (Medtronic) implantation for bradycardia. Significant problems with PVCs treated initially with flecanide with much improvement. As noted below, interval decrease in left ventricular function  prompted the discontinuation of flecainide and  initiation of amiodarone. PVCs were obliterated but without interval improvement in LVEF. Hence he underwent catheterization.    Pancreatic mass identified which has had to undergo endoscopic surveillance rather than MRI 2/2  Device    Today, the patient denies chest pain and no orthopnea.  There have been no palpitations, lightheadedness or syncope.    Complains of peripheral edema , Dyspnea assoc with exertion, provoked by  1 flight of stairs, < 100 yds.     He notes be has been retaining fluids since 12/21, he takes his diuretics 2 times daily .He can tell when his body is retaining fluids levels due to his body feeling heavy and dysnea  Diet is replete of sodium; due to eating breads, bacon, and fried foods. Diet is replete of water.    He sleeps with a BiPap machine at night   Patient denies symptoms of GI intolerance, sun sensitivity, neurological symptoms attributable to amiodarone.    DATE TEST EF%   8/15 Echo  65 %   7/17  Echo   25 % LAE (52/2.5/35)  12/17 Echo  25% AI  mild   2/18 Cath  No obstructive CAD  8/18 Echo  20-25%    12/18 CT Aorta  4.5 cm Aneruysm  9/19 Echo  25-30% LAE severe (57/2.6/28)  10/21 Echo  35% AoRoot 46 mm    Date Cr K Hgb TSH LFTs PFTs  10/17     3.19 29    5/18  1.37   2.47 18 DLCO stable  9/19 1.28   2.72 17   10/21 1.26 (4/21)  14.2(12/21) 4.29 13   2/22 1.25 4.3         Past Medical History:  Diagnosis Date   Aneurysm of thoracic aorta ([place]) 07/06/2008   CT 12/18: Ascending aorta 4.5 cm, aortic root 4.6 cm , approximately 4.3 cm  (05/04/19)          Bladder stone    BPH (benign prostatic hyperplasia)    Bradycardia    a.s/p MDT dual chamber pacemaker   Chronic systolic heart failure ([place]) 08/13/2015   Echo 10/18: Moderate LVH, EF 25, mild AI, aortic annulus dilated at 53 mm, sinotubular junction is 34 mm, proximal ascending aorta is 39 mm // Echo 12/17: Mild LVH, EF 25-30, diffuse HK, mild AI, mild MR, severe LAE, mild RAE, PASP 42 // Echo 7/17: EF 25-30, severe diffuse HK (disproportionately severe HK of the inferolateral and inferior myocardium), mild to moderate AI, aortic root 44 mm, ascend   Dyslipidemia    failed niaspan   History of echocardiogram    Echo 09/26/2017: EF 25-30, normal wall motion, grade 2 diastolic dysfunction, mild AI, severe LAE, mildly reduced RVSF   HTN (hypertension)    NICM (nonischemic cardiomyopathy) (HCC)    LHC2/18: no angiographic CAD   PVCs (premature ventricular contractions)    controlled on Amiodarone // Holter  11/17: PVCs 1.8%   Sleep apnea    USES C-PAP    Past Surgical History:  Procedure Laterality Date   CYSTOSCOPY WITH LITHOLAPAXY N/A 07/12/2014   Procedure: CYSTOSCOPY WITH LITHOLAPAXY;  Surgeon: Raynelle Bring, MD;  Location: [facility];  Service: Urology;  Laterality: N/A;   EP IMPLANTABLE DEVICE  2009   LEFT HEART CATH AND CORONARY ANGIOGRAPHY N/A 02/15/2016   Procedure: Left Heart Cath and Coronary Angiography;  Surgeon: Burnell Blanks, MD;  Location: [facility];  Service: Cardiovascular;  Laterality: N/A;   TONSILLECTOMY     TRANSURETHRAL RESECTION OF PROSTATE N/A 07/12/2014   Procedure: TRANSURETHRAL RESECTION OF THE PROSTATE (TURP);  Surgeon: Raynelle Bring, MD;  Location: [facility];  Service: Urology;  Laterality: N/A;    Current Outpatient Medications  Medication Sig Dispense Refill    acetaminophen (TYLENOL) 325 MG tablet Take 2 tablets (650 mg total) by mouth every 6 (six) hours as needed for mild pain (or Fever >/= 101). 30 tablet 0   amiodarone (PACERONE) 200 MG tablet Take one tablet by mouth once daily except Sundays. 90 tablet 3   atorvastatin (LIPITOR) 10 MG tablet TAKE 1 TABLET BY MOUTH EVERY DAY 90 tablet 3   carvedilol (COREG) 6.25 MG tablet TAKE 1 TABLET BY MOUTH TWICE A DAY 180 tablet 2   docusate sodium (COLACE) 100 MG capsule Take 100 mg by mouth daily as needed for mild constipation or moderate constipation.     ENTRESTO 24-26 MG TAKE 1 TABLET BY MOUTH TWICE A DAY. PLEASE KEEP APPOINTMENT 180 tablet 3   finasteride (PROSCAR) 5 MG tablet Take 5 mg by mouth daily.  11   furosemide (LASIX) 40 MG tablet Take 1 tab by mouth daily 1 time weekly. Take potassium same day as administration of each lasix dose. 15 tablet 3   gabapentin (NEURONTIN) 300 MG capsule Take 600 mg by mouth 2 (two) times daily.      potassium chloride (KLOR-CON) 10 MEQ tablet Take 1 tablet (10 mEq total) mouth daily 1 time weekly. Take this same days you administer your lasix. 15 tablet 3   spironolactone (ALDACTONE) 25 MG tablet Take 0.5 tablets (12.5 mg total) by mouth daily. Please keep upcoming appt in August 2022 with Dr. Caryl Comes before anymore refills. Thank you 45 tablet 0   tamsulosin (FLOMAX) 0.4 MG CAPS capsule Take 0.4 mg by mouth daily as needed.      No current facility-administered medications for this visit.    Allergies  Allergen Reactions   Codeine Nausea Only    Review of Systems negative except from HPI and PMH  Physical Exam: BP 100/62   Pulse 88   Ht 6' (1.829 m)   Wt 223 lb (101.2 kg)   SpO2 92%   BMI 30.24 kg/m  Well developed and well nourished in no acute distress HENT normal Neck supple with JVP-7 Lungs Clear Device pocket well healed; without hematoma or erythema.  There is no tethering  Regular rate and rhythm, no  gallop No  murmur Abd-soft with active  BS No Clubbing cyanosis peripheral edema Skin-warm and dry A & Oriented  Grossly normal sensory and motor function  ECG: Atrial pacing at 88 Intervals 32/13/40  Asssessment and  Plan  Sinus node dysfunction  First-degree AV block  Pacemaker-Medtronic    PVCs on amiodarone approximately 1% based on pacemaker  HTN  Renal insufficiency grade 3  NICM  Congestive heart failure-chronic-systolic-class 123456  Aortic root dilatation with central  AI  High Risk Medication Surveillance Amiodarone   Pancreatic mass  PVCs are largely quiescient.  Still on amiodarone.  With dyspnea, we will undertake PFTs with DLCO.  Need to check surveillance laboratories today.  Otherwise continue 200 mg 6 days a week.  Dyspnea and volume overload--HFrEF.  We will check his left ventricular function by echo and then we will plan to increase his spironolactone from 12.5--25 so as to hopefully be able to avoid the furosemide.  We also have a lengthy discussion regarding decreasing p.o. sodium and fluid intake.  He may also be a candidate for  Barostim;  we will check a proBNP.  His device is approaching ERI.  If his LV function remains depressed, echo as above, would be a candidate for ICD upgrade.  It also raises the question as to whether his first-degree AV block is impinging upon exercise tolerance and treadmill testing may be helpful in this regard to look at the change in AV conduction associated with exercise and whether it shortens appropriately or whether it inappropriately lengthens and then impinges upon the antecedent QRS.    I,Stephanie Williams,acting as a Education administrator for Virl Axe, MD.,have documented all relevant documentation on the behalf of Virl Axe, MD,as directed by  Virl Axe, MD while in the presence of Virl Axe, MD.  I, Virl Axe, MD, have reviewed all documentation for this visit. The documentation on 08/11/20 for the exam, diagnosis, procedures, and orders are all  accurate and complete.

## 2020-08-11 NOTE — Patient Instructions (Signed)
Medication Instructions:  Your physician recommends that you continue on your current medications as directed. Please refer to the Current Medication list given to you today.  *If you need a refill on your cardiac medications before your next appointment, please call your pharmacy*   Lab Work: TSH, Pro BNP,Liver Panel today  If you have labs (blood work) drawn today and your tests are completely normal, you will receive your results only by: [place] (if you have MyChart) OR A paper copy in the mail If you have any lab test that is abnormal or we need to change your treatment, we will call you to review the results.   Testing/Procedures: Your physician has requested that you have an echocardiogram. Echocardiography is a painless test that uses sound waves to create images of your heart. It provides your doctor with information about the size and shape of your heart and how well your heart's chambers and valves are working. This procedure takes approximately one hour. There are no restrictions for this procedure.   Your physician has recommended that you have a pulmonary function test. Pulmonary Function Tests are a group of tests that measure how well air moves in and out of your lungs.    Follow-Up: At [facility] you and your health needs are our priority.  As part of our continuing mission to provide you with exceptional heart care, we have created designated Provider Care Teams.  These Care Teams include your primary Cardiologist (physician) and Advanced Practice Providers (APPs -  Physician Assistants and Nurse Practitioners) who all work together to provide you with the care you need, when you need it.  We recommend signing up for the patient portal called "MyChart".  Sign up information is provided on this After Visit Summary.  MyChart is used to connect with patients for Virtual Visits (Telemedicine).  Patients are able to view lab/test results, encounter notes, upcoming  appointments, etc.  Non-urgent messages can be sent to your provider as well.   To learn more about what you can do with MyChart, go to [email or web address].    Your next appointment:   To be determined

## 2020-08-12 LAB — HEPATIC FUNCTION PANEL
ALT: 20 IU/L (ref 0–44)
AST: 20 IU/L (ref 0–40)
Albumin: 4.8 g/dL — ABNORMAL HIGH (ref 3.7–4.7)
Alkaline Phosphatase: 93 IU/L (ref 44–121)
Bilirubin Total: 0.4 mg/dL (ref 0.0–1.2)
Bilirubin, Direct: 0.13 mg/dL (ref 0.00–0.40)
Total Protein: 6.8 g/dL (ref 6.0–8.5)

## 2020-08-12 LAB — PRO B NATRIURETIC PEPTIDE: NT-Pro BNP: 295 pg/mL (ref 0–376)

## 2020-08-12 LAB — TSH: TSH: 2.93 u[IU]/mL (ref 0.450–4.500)

## 2020-08-15 LAB — CUP PACEART REMOTE DEVICE CHECK
Battery Impedance: 5483 Ohm
Battery Remaining Longevity: 2 mo
Battery Voltage: 2.63 V
Brady Statistic AP VP Percent: 6 %
Brady Statistic AP VS Percent: 94 %
Brady Statistic AS VP Percent: 0 %
Brady Statistic AS VS Percent: 0 %
Date Time Interrogation Session: 20220812102518
Implantable Lead Implant Date: 20090316
Implantable Lead Implant Date: 20090316
Implantable Lead Location: 753859
Implantable Lead Location: 753860
Implantable Lead Model: 5076
Implantable Lead Model: 5076
Implantable Pulse Generator Implant Date: 20090316
Lead Channel Impedance Value: 405 Ohm
Lead Channel Impedance Value: 554 Ohm
Lead Channel Pacing Threshold Amplitude: 0.625 V
Lead Channel Pacing Threshold Amplitude: 0.75 V
Lead Channel Pacing Threshold Pulse Width: 0.4 ms
Lead Channel Pacing Threshold Pulse Width: 0.4 ms
Lead Channel Setting Pacing Amplitude: 2 V
Lead Channel Setting Pacing Amplitude: 2.5 V
Lead Channel Setting Pacing Pulse Width: 0.4 ms
Lead Channel Setting Sensing Sensitivity: 4 mV

## 2020-08-19 ENCOUNTER — Telehealth: Payer: Self-pay | Admitting: *Deleted

## 2020-08-21 NOTE — Telephone Encounter (Signed)
Spoke with patient on phone about [employer] research study see encounter

## 2020-08-22 ENCOUNTER — Other Ambulatory Visit: Payer: Self-pay

## 2020-08-22 ENCOUNTER — Ambulatory Visit (INDEPENDENT_AMBULATORY_CARE_PROVIDER_SITE_OTHER): Payer: Medicare HMO | Admitting: Internal Medicine

## 2020-08-22 DIAGNOSIS — I428 Other cardiomyopathies: Secondary | ICD-10-CM | POA: Diagnosis not present

## 2020-08-22 DIAGNOSIS — I5022 Chronic systolic (congestive) heart failure: Secondary | ICD-10-CM

## 2020-08-22 LAB — PULMONARY FUNCTION TEST
DL/VA % pred: 85 %
DL/VA: 3.42 ml/min/mmHg/L
DLCO cor % pred: 69 %
DLCO cor: 18.35 ml/min/mmHg
DLCO unc % pred: 69 %
DLCO unc: 18.35 ml/min/mmHg
FEF 25-75 Post: 2.33 L/sec
FEF 25-75 Pre: 1.99 L/sec
FEF2575-%Change-Post: 17 %
FEF2575-%Pred-Post: 94 %
FEF2575-%Pred-Pre: 80 %
FEV1-%Change-Post: 3 %
FEV1-%Pred-Post: 76 %
FEV1-%Pred-Pre: 73 %
FEV1-Post: 2.54 L
FEV1-Pre: 2.45 L
FEV1FVC-%Change-Post: 1 %
FEV1FVC-%Pred-Pre: 104 %
FEV6-%Change-Post: 2 %
FEV6-%Pred-Post: 76 %
FEV6-%Pred-Pre: 75 %
FEV6-Post: 3.28 L
FEV6-Pre: 3.21 L
FEV6FVC-%Pred-Post: 106 %
FEV6FVC-%Pred-Pre: 106 %
FVC-%Change-Post: 2 %
FVC-%Pred-Post: 72 %
FVC-%Pred-Pre: 70 %
FVC-Post: 3.28 L
FVC-Pre: 3.21 L
Post FEV1/FVC ratio: 77 %
Post FEV6/FVC ratio: 100 %
Pre FEV1/FVC ratio: 76 %
Pre FEV6/FVC Ratio: 100 %
RV % pred: 101 %
RV: 2.59 L
TLC % pred: 81 %
TLC: 5.87 L

## 2020-08-22 NOTE — Progress Notes (Signed)
PFT done today. 

## 2020-08-24 ENCOUNTER — Encounter: Payer: Medicare HMO | Admitting: *Deleted

## 2020-08-24 DIAGNOSIS — Z006 Encounter for examination for normal comparison and control in clinical research program: Secondary | ICD-10-CM

## 2020-08-25 ENCOUNTER — Other Ambulatory Visit: Payer: Self-pay | Admitting: Internal Medicine

## 2020-08-25 NOTE — Research (Signed)
Patient and wife came in today talk about [employer] Design[employer], multimedia) research and commercial. I explained the whole process to them. All  questions answered. Mr Patrick Weaver is having an echo next Tuesday to see if his EF is has improved any. It is also near time for his generator to be changed out for his PPM and Dr Caryl Comes had mentioned to patient that he may need an upgrade to an ICD.  After great discussion we decided it would be better to wait and see what the plan is. If the ICD is placed I let the patient know that we could proceed with [facility] research study screening 90 days post op of ICD.  Mr and Mrs Patrick Weaver were happy with our conclusion and I thanked them for coming in to see [place] and said we would see/talk with them soon.

## 2020-08-31 ENCOUNTER — Other Ambulatory Visit: Payer: Self-pay

## 2020-08-31 ENCOUNTER — Ambulatory Visit (HOSPITAL_COMMUNITY): Payer: Medicare HMO | Attending: Internal Medicine

## 2020-08-31 DIAGNOSIS — I5022 Chronic systolic (congestive) heart failure: Secondary | ICD-10-CM | POA: Diagnosis not present

## 2020-08-31 DIAGNOSIS — Z79899 Other long term (current) drug therapy: Secondary | ICD-10-CM | POA: Diagnosis not present

## 2020-08-31 DIAGNOSIS — Z950 Presence of cardiac pacemaker: Secondary | ICD-10-CM | POA: Insufficient documentation

## 2020-08-31 DIAGNOSIS — I428 Other cardiomyopathies: Secondary | ICD-10-CM | POA: Insufficient documentation

## 2020-08-31 DIAGNOSIS — I495 Sick sinus syndrome: Secondary | ICD-10-CM | POA: Diagnosis not present

## 2020-08-31 DIAGNOSIS — I493 Ventricular premature depolarization: Secondary | ICD-10-CM | POA: Insufficient documentation

## 2020-08-31 LAB — ECHOCARDIOGRAM COMPLETE
Area-P 1/2: 3.48 cm2
S' Lateral: 4.3 cm

## 2020-09-01 NOTE — Progress Notes (Signed)
Remote pacemaker transmission.   

## 2020-09-01 NOTE — Addendum Note (Signed)
Addended by: Douglass Rivers D on: 09/01/2020 03:27 PM   Modules accepted: Level of Service

## 2020-09-09 ENCOUNTER — Telehealth: Payer: Self-pay

## 2020-09-09 NOTE — Telephone Encounter (Signed)
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

## 2020-09-09 NOTE — Telephone Encounter (Signed)
Spoke with pt and advised per Dr Caryl Comes PFT shows mild lung disease and pt will need to f/u with PCP.  Pt verbalizes understanding and agrees with current plan.

## 2020-09-12 ENCOUNTER — Ambulatory Visit (INDEPENDENT_AMBULATORY_CARE_PROVIDER_SITE_OTHER): Payer: Medicare HMO

## 2020-09-12 DIAGNOSIS — I428 Other cardiomyopathies: Secondary | ICD-10-CM | POA: Diagnosis not present

## 2020-09-13 LAB — CUP PACEART REMOTE DEVICE CHECK
Battery Impedance: 6844 Ohm
Battery Remaining Longevity: 1 mo — CL
Battery Voltage: 2.6 V
Brady Statistic AP VP Percent: 4 %
Brady Statistic AP VS Percent: 96 %
Brady Statistic AS VP Percent: 0 %
Brady Statistic AS VS Percent: 0 %
Date Time Interrogation Session: 20220912093055
Implantable Lead Implant Date: 20090316
Implantable Lead Implant Date: 20090316
Implantable Lead Location: 753859
Implantable Lead Location: 753860
Implantable Lead Model: 5076
Implantable Lead Model: 5076
Implantable Pulse Generator Implant Date: 20090316
Lead Channel Impedance Value: 398 Ohm
Lead Channel Impedance Value: 558 Ohm
Lead Channel Pacing Threshold Amplitude: 0.5 V
Lead Channel Pacing Threshold Amplitude: 0.75 V
Lead Channel Pacing Threshold Pulse Width: 0.4 ms
Lead Channel Pacing Threshold Pulse Width: 0.4 ms
Lead Channel Setting Pacing Amplitude: 2 V
Lead Channel Setting Pacing Amplitude: 2.5 V
Lead Channel Setting Pacing Pulse Width: 0.4 ms
Lead Channel Setting Sensing Sensitivity: 4 mV

## 2020-09-15 DIAGNOSIS — C44311 Basal cell carcinoma of skin of nose: Secondary | ICD-10-CM | POA: Diagnosis not present

## 2020-09-15 DIAGNOSIS — D485 Neoplasm of uncertain behavior of skin: Secondary | ICD-10-CM | POA: Diagnosis not present

## 2020-09-15 DIAGNOSIS — D225 Melanocytic nevi of trunk: Secondary | ICD-10-CM | POA: Diagnosis not present

## 2020-09-15 DIAGNOSIS — L821 Other seborrheic keratosis: Secondary | ICD-10-CM | POA: Diagnosis not present

## 2020-09-15 DIAGNOSIS — Z86018 Personal history of other benign neoplasm: Secondary | ICD-10-CM | POA: Diagnosis not present

## 2020-09-15 DIAGNOSIS — L578 Other skin changes due to chronic exposure to nonionizing radiation: Secondary | ICD-10-CM | POA: Diagnosis not present

## 2020-09-15 DIAGNOSIS — L814 Other melanin hyperpigmentation: Secondary | ICD-10-CM | POA: Diagnosis not present

## 2020-09-15 DIAGNOSIS — L570 Actinic keratosis: Secondary | ICD-10-CM | POA: Diagnosis not present

## 2020-09-15 DIAGNOSIS — D2272 Melanocytic nevi of left lower limb, including hip: Secondary | ICD-10-CM | POA: Diagnosis not present

## 2020-09-16 NOTE — Progress Notes (Signed)
Remote pacemaker transmission.   

## 2020-09-26 ENCOUNTER — Telehealth: Payer: Self-pay | Admitting: Internal Medicine

## 2020-09-26 NOTE — Telephone Encounter (Signed)
  Patient calling the office for samples of medication:   1.  What medication and dosage are you requesting samples for?  ENTRESTO 24-26 MG    2.  Are you currently out of this medication? Yes   Pt's wife calling, she said pt is in donut hole and needs samples. Also, if they can talk to someone can help enrolling for pt assistance

## 2020-09-26 NOTE — Telephone Encounter (Signed)
Spoke with the patient and patients wife(she is a retired Company secretary) and let them know we can leave 1 bottle of the Entresto 24/26mg  (2 week supply) as they can not afford to pay for being in the doughnut hole.    It was $10 for 90 day supply(commercial insurance), then when she stopped working and they went to Medicare it went to $45 for 30 day supply and now it's around $180 plus for a 30 day supply due to being in the doughnut hole.  She says they have to spend $7,000 out of pocket before Medicare will pay again.  Will forward to [place] to follow up with them after they pick up the [employer] form here at the office when they pick up samples.  They were very appreciative of any help.

## 2020-09-28 NOTE — Telephone Encounter (Signed)
**Note De-Identified Latitia Housewright Obfuscation** No answer so I left a detailed VM message (ok per DPR) asking for either the pt or his wife Butch Penny [facility]) to call Jeani Hawking back at Dr Alan Ripper office at [facility] at [phone] if they have any questions, concerns, or need assistance with the [employer] pt asst application for Entresto that they picked up from the office.

## 2020-09-29 NOTE — Telephone Encounter (Signed)
Pt's wife is returning call °

## 2020-09-29 NOTE — Telephone Encounter (Signed)
**Note De-Identified Dezarai Prew Obfuscation** The pts wife wants to know if her income is required along with the pts when applying for asst through [employer] for asst with Entresto  and if they require last years proof of income or can they use this years?   I advised her that it is my understanding that they want the income amount for the entire household from the year prior to applying but that I am unsure. I advised her to contact [employer] and she states that she will do that now.  She states that they will be bringing the pts [employer] application to the office to drop off as soon as she is clear on what [employer] requires. She thanked me for calling her back.

## 2020-10-03 ENCOUNTER — Telehealth: Payer: Self-pay | Admitting: Internal Medicine

## 2020-10-03 DIAGNOSIS — Z950 Presence of cardiac pacemaker: Secondary | ICD-10-CM

## 2020-10-03 DIAGNOSIS — Z79899 Other long term (current) drug therapy: Secondary | ICD-10-CM

## 2020-10-03 DIAGNOSIS — I5022 Chronic systolic (congestive) heart failure: Secondary | ICD-10-CM

## 2020-10-03 NOTE — Telephone Encounter (Signed)
Please set up for pt to take lasix with KCL every other day     F/U BMET and BNP in 2 wks

## 2020-10-03 NOTE — Telephone Encounter (Signed)
Patient has a Dealer.  This device does not allow for monitoring of thoracic impedence or fluid status.

## 2020-10-03 NOTE — Telephone Encounter (Signed)
New Message:    Wife called and wanted you to know  tghat you should be getting paper work from [employer].

## 2020-10-04 ENCOUNTER — Other Ambulatory Visit: Payer: Self-pay | Admitting: Internal Medicine

## 2020-10-04 DIAGNOSIS — Z950 Presence of cardiac pacemaker: Secondary | ICD-10-CM

## 2020-10-04 DIAGNOSIS — R140 Abdominal distension (gaseous): Secondary | ICD-10-CM | POA: Diagnosis not present

## 2020-10-04 DIAGNOSIS — K862 Cyst of pancreas: Secondary | ICD-10-CM | POA: Diagnosis not present

## 2020-10-04 DIAGNOSIS — I5022 Chronic systolic (congestive) heart failure: Secondary | ICD-10-CM

## 2020-10-04 DIAGNOSIS — Z79899 Other long term (current) drug therapy: Secondary | ICD-10-CM

## 2020-10-04 MED ORDER — FUROSEMIDE 40 MG PO TABS: 40.0000 mg | ORAL_TABLET | ORAL | 1 refills | Status: DC

## 2020-10-04 MED ORDER — POTASSIUM CHLORIDE ER 10 MEQ PO TBCR: 10.0000 meq | EXTENDED_RELEASE_TABLET | ORAL | 0 refills | Status: DC

## 2020-10-04 MED ORDER — FUROSEMIDE 40 MG PO TABS: 40.0000 mg | ORAL_TABLET | ORAL | 0 refills | Status: DC

## 2020-10-04 MED ORDER — POTASSIUM CHLORIDE ER 10 MEQ PO TBCR: 10.0000 meq | EXTENDED_RELEASE_TABLET | ORAL | 1 refills | Status: DC

## 2020-10-04 NOTE — Addendum Note (Signed)
Addended by: Nuala Alpha on: 10/04/2020 04:26 PM   Modules accepted: Orders

## 2020-10-04 NOTE — Telephone Encounter (Signed)
Pts lasix and KCL instructions were resent to his pharmacy of choice.  It appears the pharmacy sent over a refill of both meds this afternoon and sent in the refill for his old instructions and doses for these meds.  Per Dr. [name], the pt is to take his lasix and KCL every other day, which was sent this morning, but refill dept refilled his old prescription vs new instructions for every other day dosing.    Corrected pts lasix and KCL prescriptions to reflect the following, per Dr. [name] and as mentioned below:  Pt is to take lasix 40 mg po every other day. Pt is to take KCL 10 mEq po every other day with lasix.   Pt is aware that new dose changes were resent back to his pharmacy to fill.  Also called [facility] and endorsed to them that they should fill lasix and KCL for every other day.  Pharmacy will correct and work on this now for the pt. Pt aware and was gracious for all the assistance provided.

## 2020-10-04 NOTE — Telephone Encounter (Signed)
Patient's wife calling back. She states they are going to have the patient's PCP fill out the paperwork, but to hold on to it in case they end up needing Dr. [name] to fill it out.

## 2020-10-04 NOTE — Telephone Encounter (Signed)
Spoke with the pt and he is aware of recommendations per Dr. [name].  Pt is aware to take his potassium and KCL every other day and come in for repeat BMET and PRO-BNP in 2 weeks.  Pt states he picked his lasix and KCL up yesterday but will now start taking this every other day vs current instructions. Pt will come in for repeat BMET and PRO-BNP in 2 weeks on 10/18. Pt verbalized understanding and agrees with this plan.

## 2020-10-06 ENCOUNTER — Telehealth: Payer: Self-pay

## 2020-10-06 NOTE — Telephone Encounter (Signed)
[employer] asst application for [employer] faxed with Dr. Curt Bears signature to [phone].

## 2020-10-06 NOTE — Telephone Encounter (Signed)
**Note De-Identified Reise Hietala Obfuscation** The pts [employer] pt asst application for Delene Loll was left at the office with documents.  I have completed the provider page of his application and emailed it to Dr Alan Ripper nurse so she can obtain Dr Macky Lower (DOD) signature in Dr Alan Ripper absence,, date it and to fax all to [place] at the fax number written on the cover letter included or to place in the to be faxed basket in Medical Records to be faxed.

## 2020-10-12 DIAGNOSIS — H524 Presbyopia: Secondary | ICD-10-CM | POA: Diagnosis not present

## 2020-10-12 DIAGNOSIS — H11153 Pinguecula, bilateral: Secondary | ICD-10-CM | POA: Diagnosis not present

## 2020-10-12 DIAGNOSIS — H52203 Unspecified astigmatism, bilateral: Secondary | ICD-10-CM | POA: Diagnosis not present

## 2020-10-12 DIAGNOSIS — H35373 Puckering of macula, bilateral: Secondary | ICD-10-CM | POA: Diagnosis not present

## 2020-10-12 DIAGNOSIS — H02831 Dermatochalasis of right upper eyelid: Secondary | ICD-10-CM | POA: Diagnosis not present

## 2020-10-12 DIAGNOSIS — H2513 Age-related nuclear cataract, bilateral: Secondary | ICD-10-CM | POA: Diagnosis not present

## 2020-10-12 DIAGNOSIS — H43813 Vitreous degeneration, bilateral: Secondary | ICD-10-CM | POA: Diagnosis not present

## 2020-10-12 DIAGNOSIS — H18003 Unspecified corneal deposit, bilateral: Secondary | ICD-10-CM | POA: Diagnosis not present

## 2020-10-12 DIAGNOSIS — H02834 Dermatochalasis of left upper eyelid: Secondary | ICD-10-CM | POA: Diagnosis not present

## 2020-10-13 ENCOUNTER — Ambulatory Visit (INDEPENDENT_AMBULATORY_CARE_PROVIDER_SITE_OTHER): Payer: Medicare HMO

## 2020-10-13 ENCOUNTER — Telehealth: Payer: Self-pay

## 2020-10-13 DIAGNOSIS — I428 Other cardiomyopathies: Secondary | ICD-10-CM

## 2020-10-13 NOTE — Telephone Encounter (Signed)
Faxed to [employer].

## 2020-10-13 NOTE — Telephone Encounter (Signed)
**Note De-Identified Dezmin Kittelson Obfuscation** Per Estill Bamberg at [employer] a [place] PA is required before they can process the pts application. She states that they will need proof of the PA determination when available.  I did the [place] PA through covermymeds and received a message that a PA is not required per [facility].  I printed the determination from covermymeds and have emailed it to our TTL for today so she can fax to [employer].

## 2020-10-13 NOTE — Telephone Encounter (Signed)
Scheduled, nonbillable, remote reviewed. Normal device function.   The device reached the elective replacement indicator on 09/23/2020 sent to triage. The device is at VVI 65 bpm due to ERI status  Pt last OV 08/11/20, notes indicate Gen chagneout discussed, Echo ordered for LV function.  Echo completed 08/31/20.    Spoke with pt, he has not noticed any worsening symptoms since reaching ERI.    Advised I would forward to Dr. [name] nurse for scheduling of procedure.

## 2020-10-13 NOTE — Telephone Encounter (Signed)
Patrick Weaver from [employer]. Assistance if following up on Prior Auth for [facility] [phone] Contact ID# [id number]

## 2020-10-14 LAB — CUP PACEART REMOTE DEVICE CHECK
Battery Impedance: 7887 Ohm
Battery Voltage: 2.61 V
Brady Statistic RV Percent Paced: 4 %
Date Time Interrogation Session: 20221013102611
Implantable Lead Implant Date: 20090316
Implantable Lead Implant Date: 20090316
Implantable Lead Location: 753859
Implantable Lead Location: 753860
Implantable Lead Model: 5076
Implantable Lead Model: 5076
Implantable Pulse Generator Implant Date: 20090316
Lead Channel Impedance Value: 578 Ohm
Lead Channel Impedance Value: 67 Ohm
Lead Channel Setting Pacing Amplitude: 2.5 V
Lead Channel Setting Pacing Pulse Width: 0.4 ms
Lead Channel Setting Sensing Sensitivity: 2.8 mV

## 2020-10-14 NOTE — Telephone Encounter (Signed)
Pt scheduled to see Oda Kilts, PA-C on 10/26/2020

## 2020-10-14 NOTE — Telephone Encounter (Signed)
Spoke with pt and appointment scheduled with Oda Kilts, PA-C on 10/26/2020 at 920am when Dr Caryl Comes is in the office as well to discuss generator change with possible upgrade and Barostim procedure previously mentioned by Dr Caryl Comes at pt's last [place] 08/22.  Pt continues to have increasing SOB over the past year.  Pt is scheduled for labs requested by Dr [name] on 10/18 and appointment with pulmonary on 10/19.  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 Pt verbalizes understanding of appointment date and time.and thanked Therapist, sports for the phone call.

## 2020-10-18 ENCOUNTER — Other Ambulatory Visit: Payer: Medicare HMO | Admitting: *Deleted

## 2020-10-18 ENCOUNTER — Other Ambulatory Visit: Payer: Self-pay

## 2020-10-18 DIAGNOSIS — Z950 Presence of cardiac pacemaker: Secondary | ICD-10-CM

## 2020-10-18 DIAGNOSIS — Z79899 Other long term (current) drug therapy: Secondary | ICD-10-CM

## 2020-10-18 DIAGNOSIS — I5022 Chronic systolic (congestive) heart failure: Secondary | ICD-10-CM

## 2020-10-19 ENCOUNTER — Telehealth: Payer: Self-pay

## 2020-10-19 ENCOUNTER — Ambulatory Visit: Payer: Medicare HMO | Admitting: Internal Medicine

## 2020-10-19 ENCOUNTER — Encounter: Payer: Self-pay | Admitting: Internal Medicine

## 2020-10-19 VITALS — BP 94/60 | HR 68 | Temp 97.6°F | Ht 72.0 in | Wt 221.8 lb

## 2020-10-19 DIAGNOSIS — R0602 Shortness of breath: Secondary | ICD-10-CM

## 2020-10-19 LAB — BASIC METABOLIC PANEL
BUN/Creatinine Ratio: 12 (ref 10–24)
BUN: 19 mg/dL (ref 8–27)
CO2: 22 mmol/L (ref 20–29)
Calcium: 9.3 mg/dL (ref 8.6–10.2)
Chloride: 106 mmol/L (ref 96–106)
Creatinine, Ser: 1.55 mg/dL — ABNORMAL HIGH (ref 0.76–1.27)
Glucose: 102 mg/dL — ABNORMAL HIGH (ref 70–99)
Potassium: 4.5 mmol/L (ref 3.5–5.2)
Sodium: 143 mmol/L (ref 134–144)
eGFR: 47 mL/min/{1.73_m2} — ABNORMAL LOW (ref >59)

## 2020-10-19 LAB — PRO B NATRIURETIC PEPTIDE: NT-Pro BNP: 884 pg/mL — ABNORMAL HIGH (ref 0–376)

## 2020-10-19 MED ORDER — EMPAGLIFLOZIN 10 MG PO TABS: 10.0000 mg | ORAL_TABLET | Freq: Every day | ORAL | 3 refills | Status: DC

## 2020-10-19 NOTE — Telephone Encounter (Signed)
-----   Message from [place], MD sent at 10/19/2020  3:21 PM EDT ----- Patient BNP is elevated, not as high as 1 year ago   Cr mildly increased at 1.55 Reviewed his meds    He has appt soon with [name] Are there samples of Jardiance 10 mg for him to try, see if help with diuresis .

## 2020-10-19 NOTE — Telephone Encounter (Signed)
Pt advised and will pick up his samples 10/20/20.   Pt given #28 samples of the 10 mg tabs... LOT [id number] Exp 05/2022  Pt to call in a few days to let us know how he is feeling.   I also gave the pt the number for the pt assistance program.

## 2020-10-19 NOTE — Patient Instructions (Addendum)
Please schedule follow up scheduled with myself in 2 months.  I will see you after your pace maker battery is changed out to follow up on your shortness of breath.   Take lasix every day for the next 4 days, then you can go back to every other day.

## 2020-10-19 NOTE — Progress Notes (Signed)
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 History of Atrial Fibrillation s/p PPM on amiodarone. He also has OSA and is compliant with PAP therapy.   He notes symptoms started around christmas last year. Around that time he also started having problems with fluid retention.  He has had dyspnea on exertion for several years but has been progressing over the last year. Dyspnea with ADLs. He is also due for a generator change on his pace maker which is due to be replaced in the next couple of weeks.   He denies coughing, chest tightness, wheezing. Denies pneumonia.  Has had bronchitis once/year in the past, but not in several years.   He is sob with rest and has difficulty with long sentences. Wearing a mask makes things worse.   Has been getting regular PFTs for amiodarone toxicity monitoring. No   Social history:  Occupation: worked as an Cabin crew in the past, did brake work.  Exposures: lives at home with wife. No pets.  Smoking history: never smoker, no passive smoke exposure  Social History   Occupational History   Not on file  Tobacco Use   Smoking status: Never   Smokeless tobacco: Never  Vaping Use   Vaping Use: Never used  Substance and Sexual Activity   Alcohol use: No   Drug use: No   Sexual activity: Not on file    Relevant family history:  Family History  Problem Relation Age of Onset   Hypertension Mother    Cancer Mother    Congestive Heart Failure Father    Heart disease Father    Heart attack Father    Colon cancer Maternal Grandfather     Hypertension Brother    Stomach cancer Neg Hx    Esophageal cancer Neg Hx     Past Medical History:  Diagnosis Date   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   CT 12/18: Ascending aorta 4.5 cm, aortic root 4.6 cm , approximately 4.3 cm  (05/04/19)          Bladder stone    BPH (benign prostatic hyperplasia)    Bradycardia    a.s/p MDT dual chamber pacemaker   Chronic systolic heart failure ([place]) 08/13/2015   Echo 10/18: Moderate LVH, EF 25, mild AI, aortic annulus dilated at 53 mm, sinotubular junction is 34 mm, proximal ascending aorta is 39 mm // Echo 12/17: Mild LVH, EF 25-30, diffuse HK, mild AI, mild MR, severe LAE, mild RAE, PASP 42 // Echo 7/17: EF 25-30, severe diffuse HK (disproportionately severe HK of the inferolateral and inferior myocardium), mild to moderate AI, aortic root 44 mm, ascend   Dyslipidemia    failed niaspan   History of echocardiogram    Echo 09/26/2017: EF 25-30, normal wall motion, grade 2 diastolic dysfunction, mild AI, severe LAE, mildly reduced RVSF   HTN (hypertension)    NICM (nonischemic cardiomyopathy) (HCC)    LHC2/18: no angiographic CAD   PVCs (premature ventricular contractions)    controlled on Amiodarone // Holter 11/17:  PVCs 1.8%   Sleep apnea    USES C-PAP    Past Surgical History:  Procedure Laterality Date   CYSTOSCOPY WITH LITHOLAPAXY N/A 07/12/2014   Procedure: CYSTOSCOPY WITH LITHOLAPAXY;  Surgeon: Raynelle Bring, MD;  Location: [facility];  Service: Urology;  Laterality: N/A;   EP IMPLANTABLE DEVICE  2009   LEFT HEART CATH AND CORONARY ANGIOGRAPHY N/A 02/15/2016   Procedure: Left Heart Cath and Coronary Angiography;  Surgeon: Burnell Blanks, MD;  Location: [facility];  Service: Cardiovascular;  Laterality: N/A;   TONSILLECTOMY     TRANSURETHRAL RESECTION OF PROSTATE N/A 07/12/2014   Procedure: TRANSURETHRAL RESECTION OF THE PROSTATE (TURP);  Surgeon: Raynelle Bring, MD;  Location: [facility];  Service: Urology;  Laterality: N/A;      Physical Exam: Blood pressure 94/60, pulse 68, temperature 97.6 F (36.4 C), temperature source Oral, height 6' (1.829 m), weight 221 lb 12.8 oz (100.6 kg), SpO2 95 %. Gen:      No acute distress ENT:  no nasal polyps, mucus membranes moist, mallampati IV Lungs:    No increased respiratory effort, symmetric chest wall excursion, clear to auscultation bilaterally, no wheezes or crackles CV:         Regular rate and rhythm; no murmurs, rubs, or gallops.  No pedal edema Abd:      + bowel sounds; soft, non-tender; no distension MSK: no acute synovitis of DIP or PIP joints, no mechanics hands.  Skin:      Warm and dry; no rashes Neuro: normal speech, no focal facial asymmetry Psych: alert and oriented x3, normal mood and affect   Data Reviewed/Medical Decision Making:  Independent interpretation of tests: Imaging:  Review of patient's CT Angio 2021 lung windows images revealed no acute pulmonary process. The patient's images have been independently reviewed by me.  Stable from previous Hi res CT in 2018  PFTs: I have personally reviewed the patient's PFTs and normal pulmonary function, although there is a slight downward trend since 2018. PFT Results Latest Ref Rng & Units 08/22/2020 02/04/2017 11/01/2016 05/31/2016  FVC-Pre L 3.21 3.77 3.52 3.63  FVC-Predicted Pre % 70 78 79 81  FVC-Post L 3.28 - 3.39 3.94  FVC-Predicted Post % 72 - 76 88  Pre FEV1/FVC % % 76 80 83 79  Post FEV1/FCV % % 77 - 82 78  FEV1-Pre L 2.45 3.01 2.93 2.87  FEV1-Predicted Pre % 73 84 89 87  FEV1-Post L 2.54 - 2.78 3.07  DLCO uncorrected ml/min/mmHg 18.35 22.25 19.19 21.20  DLCO UNC% % 69 63 59 66  DLCO corrected ml/min/mmHg 18.35 - - 20.97  DLCO COR %Predicted % 69 - - 65  DLVA Predicted % 85 79 81 78  TLC L 5.87 6.51 6.26 6.50  TLC % Predicted % 81 87 89 93  RV % Predicted % 101 97 118 101    Labs:  Lab Results  Component Value Date   WBC WILL FOLLOW 12/28/2019   WBC 6.9 12/28/2019   HGB WILL  FOLLOW 12/28/2019   HGB 14.2 12/28/2019   HCT WILL FOLLOW 12/28/2019   HCT 42.5 12/28/2019   MCV WILL FOLLOW 12/28/2019   MCV 91 12/28/2019   PLT WILL FOLLOW 12/28/2019   PLT 154 12/28/2019   BNP elevated at over 800  Immunization status:  Immunization History  Administered Date(s) Administered   Fluad Quad(high Dose 65+) 12/11/2019, 10/18/2020   Influenza, High Dose Seasonal PF 09/24/2017   Influenza,inj,Quad PF,6+ Mos 10/30/2013  PFIZER(Purple Top)SARS-COV-2 Vaccination 02/07/2019, 03/04/2019, 10/15/2019   Pfizer Covid-19 Vaccine Bivalent Booster 74yrs & up 09/21/2020     I reviewed prior external note(s) from cardiology, PCP  I reviewed the result(s) of the labs and imaging as noted above.    Assessment:  Shortness of breath OSA on CPAP Decompenated HFpeF  Plan/Recommendations:  Shortness of breath likely secondary to cardiac etiology. His PFTs show normal pulmonary function, although slight downwards trend since 2018 in his FVC. No evidence of ILD/fibrosis on imaging or exam. Safe to continue amiodarone.  He is compliant with PAP therapy so it seems OSA is adequately treated.  His BNP is elevated slightly so I recommend taking lasix daily for the next 4 days and then can go back to every other day as prescribed.  If his symptoms aren't improved with generator change, I think CPET would be the next step. I will see him  back after this to follow up on symptoms.   We discussed disease management and progression at length today.    Return to Care: Return in about 2 months (around 12/19/2020).  Lenice Llamas, MD Pulmonary and [place]  CC: Vernie Shanks, MD

## 2020-10-21 NOTE — Progress Notes (Signed)
Remote pacemaker transmission.   

## 2020-10-21 NOTE — Addendum Note (Signed)
Addended by: Cheri Kearns A on: 10/21/2020 04:19 PM   Modules accepted: Level of Service

## 2020-10-26 ENCOUNTER — Other Ambulatory Visit: Payer: Self-pay

## 2020-10-26 ENCOUNTER — Ambulatory Visit: Payer: Medicare HMO | Admitting: Internal Medicine

## 2020-10-26 ENCOUNTER — Encounter: Payer: Medicare HMO | Admitting: Student

## 2020-10-26 ENCOUNTER — Encounter: Payer: Self-pay | Admitting: Internal Medicine

## 2020-10-26 VITALS — BP 110/60 | HR 69 | Ht 72.0 in | Wt 220.4 lb

## 2020-10-26 DIAGNOSIS — Z01812 Encounter for preprocedural laboratory examination: Secondary | ICD-10-CM | POA: Diagnosis not present

## 2020-10-26 DIAGNOSIS — Z950 Presence of cardiac pacemaker: Secondary | ICD-10-CM | POA: Diagnosis not present

## 2020-10-26 DIAGNOSIS — I493 Ventricular premature depolarization: Secondary | ICD-10-CM

## 2020-10-26 DIAGNOSIS — Z01818 Encounter for other preprocedural examination: Secondary | ICD-10-CM | POA: Diagnosis not present

## 2020-10-26 DIAGNOSIS — I5022 Chronic systolic (congestive) heart failure: Secondary | ICD-10-CM

## 2020-10-26 DIAGNOSIS — I428 Other cardiomyopathies: Secondary | ICD-10-CM

## 2020-10-26 LAB — BASIC METABOLIC PANEL
BUN/Creatinine Ratio: 15 (ref 10–24)
BUN: 25 mg/dL (ref 8–27)
CO2: 26 mmol/L (ref 20–29)
Calcium: 9.6 mg/dL (ref 8.6–10.2)
Chloride: 107 mmol/L — ABNORMAL HIGH (ref 96–106)
Creatinine, Ser: 1.64 mg/dL — ABNORMAL HIGH (ref 0.76–1.27)
Glucose: 96 mg/dL (ref 70–99)
Potassium: 4.4 mmol/L (ref 3.5–5.2)
Sodium: 142 mmol/L (ref 134–144)
eGFR: 44 mL/min/{1.73_m2} — ABNORMAL LOW (ref >59)

## 2020-10-26 LAB — CUP PACEART INCLINIC DEVICE CHECK
Battery Impedance: 7937 Ohm
Battery Voltage: 2.62 V
Brady Statistic RV Percent Paced: 4 %
Date Time Interrogation Session: 20221026121300
Implantable Lead Implant Date: 20090316
Implantable Lead Implant Date: 20090316
Implantable Lead Location: 753859
Implantable Lead Location: 753860
Implantable Lead Model: 5076
Implantable Lead Model: 5076
Implantable Pulse Generator Implant Date: 20090316
Lead Channel Impedance Value: 513 Ohm
Lead Channel Impedance Value: 67 Ohm
Lead Channel Pacing Threshold Amplitude: 0.75 V
Lead Channel Pacing Threshold Pulse Width: 0.4 ms
Lead Channel Sensing Intrinsic Amplitude: 11.2 mV
Lead Channel Setting Pacing Amplitude: 2.5 V
Lead Channel Setting Pacing Pulse Width: 0.4 ms
Lead Channel Setting Sensing Sensitivity: 2.8 mV

## 2020-10-26 LAB — CBC
Hematocrit: 43.6 % (ref 37.5–51.0)
Hemoglobin: 14.7 g/dL (ref 13.0–17.7)
MCH: 31.5 pg (ref 26.6–33.0)
MCHC: 33.7 g/dL (ref 31.5–35.7)
MCV: 94 fL (ref 79–97)
Platelets: 143 10*3/uL — ABNORMAL LOW (ref 150–450)
RBC: 4.66 x10E6/uL (ref 4.14–5.80)
RDW: 14.3 % (ref 11.6–15.4)
WBC: 7 10*3/uL (ref 3.4–10.8)

## 2020-10-26 MED ORDER — POTASSIUM CHLORIDE ER 10 MEQ PO TBCR: 10.0000 meq | EXTENDED_RELEASE_TABLET | Freq: Every day | ORAL | 1 refills | Status: DC

## 2020-10-26 MED ORDER — FUROSEMIDE 40 MG PO TABS: 40.0000 mg | ORAL_TABLET | Freq: Every day | ORAL | 1 refills | Status: DC

## 2020-10-26 NOTE — Patient Instructions (Addendum)
Medication Instructions:  Your physician has recommended you make the following change in your medication:   Begin taking Furosemide 40mg  - 1 tablet by mouth daily Begin taking Potassium 35meq - 1 tablet by mouth daily   *If you need a refill on your cardiac medications before your next appointment, please call your pharmacy*   Lab Work: CBC and BMET today If you have labs (blood work) drawn today and your tests are completely normal, you will receive your results only by: [place] (if you have MyChart) OR A paper copy in the mail If you have any lab test that is abnormal or we need to change your treatment, we will call you to review the results.   Testing/Procedures: Pacemaker Generator Change - 11/04/2020   Follow-Up: At [facility], you and your health needs are our priority.  As part of our continuing mission to provide you with exceptional heart care, we have created designated Provider Care Teams.  These Care Teams include your primary Cardiologist (physician) and Advanced Practice Providers (APPs -  Physician Assistants and Nurse Practitioners) who all work together to provide you with the care you need, when you need it.  We recommend signing up for the patient portal called "MyChart".  Sign up information is provided on this After Visit Summary.  MyChart is used to connect with patients for Virtual Visits (Telemedicine).  Patients are able to view lab/test results, encounter notes, upcoming appointments, etc.  Non-urgent messages can be sent to your provider as well.   To learn more about what you can do with MyChart, go to [email or web address].    Your next appointment:   To be scheduled

## 2020-10-26 NOTE — Progress Notes (Signed)
Patient ID: Patrick Weaver, male   DOB: [birth year], 75 y.o.   MRN: [id number] o     Patient Care Team: Vernie Shanks, MD as PCP - General (Family Medicine) Fay Records, MD as PCP - Cardiology (Cardiology) Deboraha Sprang, MD as PCP - Electrophysiology (Cardiology)   HPI  Patrick Weaver is a 75 y.o. male seen in followup for pacer (Medtronic) implantation for bradycardia. Significant problems with PVCs treated initially with flecanide with much improvement. As noted below, interval decrease in left ventricular function  prompted the discontinuation of flecainide and  initiation of amiodarone. PVCs were obliterated but without interval improvement in LVEF. Hence he underwent catheterization.    Pancreatic mass identified which has had to undergo endoscopic surveillance rather than MRI 2/2  Device    Continues to struggle with shortness of breath.  Perhaps worse since he reverted to VVI at the end of September.  Started on Jardiance no real improvement at this point.  He sleeps with a BiPap machine at night   Patient denies symptoms of GI intolerance, sun sensitivity, neurological symptoms attributable to amiodarone.    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   CT 12/18: Ascending aorta 4.5 cm, aortic root 4.6 cm , approximately 4.3 cm  (05/04/19)          Bladder stone    BPH (benign prostatic hyperplasia)    Bradycardia    a.s/p MDT dual chamber  pacemaker   Chronic systolic heart failure ([place]) 08/13/2015   Echo 10/18: Moderate LVH, EF 25, mild AI, aortic annulus dilated at 53 mm, sinotubular junction is 34 mm, proximal ascending aorta is 39 mm // Echo 12/17: Mild LVH, EF 25-30, diffuse HK, mild AI, mild MR, severe LAE, mild RAE, PASP 42 // Echo 7/17: EF 25-30, severe diffuse HK (disproportionately severe HK of the inferolateral and inferior myocardium), mild to moderate AI, aortic root 44 mm, ascend   Dyslipidemia    failed niaspan   History of echocardiogram    Echo 09/26/2017: EF 25-30, normal wall motion, grade 2 diastolic dysfunction, mild AI, severe LAE, mildly reduced RVSF   HTN (hypertension)    NICM (nonischemic cardiomyopathy) (HCC)    LHC2/18: no angiographic CAD   PVCs (premature ventricular contractions)    controlled on Amiodarone // Holter 11/17: PVCs 1.8%   Sleep apnea    USES C-PAP    Past Surgical History:  Procedure Laterality Date   CYSTOSCOPY WITH LITHOLAPAXY N/A 07/12/2014   Procedure: CYSTOSCOPY WITH LITHOLAPAXY;  Surgeon: Raynelle Bring, MD;  [facility];  Service: Urology;  Laterality: N/A;   EP IMPLANTABLE DEVICE  2009   LEFT HEART CATH AND CORONARY ANGIOGRAPHY N/A 02/15/2016   Procedure: Left Heart Cath and Coronary Angiography;  Surgeon: Burnell Blanks, MD;  Location: [facility];  Service: Cardiovascular;  Laterality: N/A;   TONSILLECTOMY     TRANSURETHRAL RESECTION OF PROSTATE N/A 07/12/2014   Procedure: TRANSURETHRAL RESECTION OF THE PROSTATE (TURP);  Surgeon: Raynelle Bring, MD;  [facility];  Service: Urology;  Laterality: N/A;    Current Outpatient Medications  Medication Sig Dispense Refill   acetaminophen (TYLENOL) 325 MG tablet Take 2 tablets (650 mg total) by mouth every 6 (six) hours as needed for mild pain (or Fever >/= 101). 30 tablet 0   amiodarone (PACERONE) 200 MG tablet TAKE ONE TABLET BY MOUTH ONCE DAILY EXCEPT SUNDAYS. 90 tablet 1   atorvastatin (LIPITOR) 10 MG  tablet TAKE 1 TABLET BY MOUTH EVERY DAY 90 tablet 1   carvedilol (COREG) 6.25 MG tablet TAKE 1 TABLET BY MOUTH TWICE A DAY 180 tablet 2   docusate sodium (COLACE) 100 MG capsule Take 100 mg by mouth daily as needed for mild constipation or moderate constipation.     empagliflozin (JARDIANCE) 10 MG TABS tablet Take 1 tablet (10 mg total) by mouth daily before breakfast. 90 tablet 3   ENTRESTO 24-26 MG TAKE 1 TABLET BY MOUTH TWICE A DAY. PLEASE KEEP APPOINTMENT 180 tablet 3   finasteride (PROSCAR) 5 MG tablet Take 5 mg by mouth daily.  11   furosemide (LASIX) 40 MG tablet Take 1 tablet (40 mg total) by mouth every other day. Take potassium same days you administer your lasix. 20 tablet 1   gabapentin (NEURONTIN) 300 MG capsule Take 300 mg by mouth daily as needed.     potassium chloride (KLOR-CON) 10 MEQ tablet Take 1 tablet (10 mEq total) by mouth every other day. Take this same days you administer your every other day lasix. 20 tablet 1   spironolactone (ALDACTONE) 25 MG tablet TAKE 0.5 TABLETS (12.5 MG TOTAL) BY MOUTH DAILY. PLEASE KEEP UPCOMING APPT IN AUGUST 45 tablet 1   tamsulosin (FLOMAX) 0.4 MG CAPS capsule Take 0.4 mg by mouth daily as needed.      No current facility-administered medications for this visit.    Allergies  Allergen Reactions   Codeine Nausea Only    Review of Systems negative except from HPI and PMH  Physical Exam: BP 110/60   Pulse 69   Ht 6' (1.829 m)   Wt 220 lb 6.4 oz (100 kg)   SpO2 95%   BMI 29.89 kg/m   Well developed and well nourished in no acute distress HENT normal Neck supple with JVP-<10 Clear Device pocket well healed; without hematoma or erythema.  There is no tethering  Regular rate and rhythm, no  gallop No  murmur Abd-soft with active BS No Clubbing cyanosis  edema Skin-warm and dry A & Oriented  Grossly normal sensory and motor function  ECG sinus rhythm with competitive ventricular pacing  Asssessment and  Plan  Sinus node  dysfunction  First-degree AV block  Pacemaker-Medtronic    PVCs on amiodarone approximately 1% based on pacemaker  HTN  Renal insufficiency grade 3  NICM  Congestive heart failure-chronic-systolic-class 7M-5Y  Aortic root dilatation with central AI  High Risk Medication Surveillance Amiodarone   Pancreatic mass  PVCs are quiescient.  We will continue amiodarone.  Dyspneic and mildly volume overloaded--we will increase his diuretics with some  trepidation given his renal issues.  We will go from Lasix 40 every other day-daily.  Lengthy discussion regarding upgrade of his device to high-voltage versus low voltage.  We reviewed data from [place], as well as some of the meta-analysis indicating the benefits of 8 but not precluding the based on age.  Moreover, we reviewed that these data are prior to [facility] as so augmented benefits would be anticipated.  60% of the patients are on spironolactone.  Furthermore, the issue of surveillance of his pancreatic mass would be challenging with an abandoned lead.  His ejection fraction is borderline at 30-35% and so do not feel strongly that there is data to support ICD upgrade in him --he will let us know what he ultimately decides.  He was also a candidate for baricitinib based on his elevated BNP.  This would need to be delayed until about 3 months following generator replacement.  He has wife are both enthusiastic  Continue his carvedilol 6.25, Entresto 24/26 and spironolactone 12.5.  We will also continue Jardiance 10

## 2020-10-26 NOTE — H&P (View-Only) (Signed)
Patient ID: Patrick Weaver, male   DOB: 1945/02/04, 75 y.o.   MRN: [id number] o     Patient Care Team: Vernie Shanks, MD as PCP - General (Family Medicine) Fay Records, MD as PCP - Cardiology (Cardiology) Deboraha Sprang, MD as PCP - Electrophysiology (Cardiology)   HPI  Patrick Weaver is a 75 y.o. male seen in followup for pacer (Medtronic) implantation for bradycardia. Significant problems with PVCs treated initially with flecanide with much improvement. As noted below, interval decrease in left ventricular function  prompted the discontinuation of flecainide and  initiation of amiodarone. PVCs were obliterated but without interval improvement in LVEF. Hence he underwent catheterization.    Pancreatic mass identified which has had to undergo endoscopic surveillance rather than MRI 2/2  Device    Continues to struggle with shortness of breath.  Perhaps worse since he reverted to VVI at the end of September.  Started on Jardiance no real improvement at this point.  He sleeps with a BiPap machine at night   Patient denies symptoms of GI intolerance, sun sensitivity, neurological symptoms attributable to amiodarone.    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   CT 12/18: Ascending aorta 4.5 cm, aortic root 4.6 cm , approximately 4.3 cm  (05/04/19)          Bladder stone    BPH (benign prostatic hyperplasia)    Bradycardia    a.s/p MDT dual chamber  pacemaker   Chronic systolic heart failure ([place]) 08/13/2015   Echo 10/18: Moderate LVH, EF 25, mild AI, aortic annulus dilated at 53 mm, sinotubular junction is 34 mm, proximal ascending aorta is 39 mm // Echo 12/17: Mild LVH, EF 25-30, diffuse HK, mild AI, mild MR, severe LAE, mild RAE, PASP 42 // Echo 7/17: EF 25-30, severe diffuse HK (disproportionately severe HK of the inferolateral and inferior myocardium), mild to moderate AI, aortic root 44 mm, ascend   Dyslipidemia    failed niaspan   History of echocardiogram    Echo 09/26/2017: EF 25-30, normal wall motion, grade 2 diastolic dysfunction, mild AI, severe LAE, mildly reduced RVSF   HTN (hypertension)    NICM (nonischemic cardiomyopathy) (HCC)    LHC2/18: no angiographic CAD   PVCs (premature ventricular contractions)    controlled on Amiodarone // Holter 11/17: PVCs 1.8%   Sleep apnea    USES C-PAP    Past Surgical History:  Procedure Laterality Date   CYSTOSCOPY WITH LITHOLAPAXY N/A 07/12/2014   Procedure: CYSTOSCOPY WITH LITHOLAPAXY;  Surgeon: Raynelle Bring, MD;  Location: [facility];  Service: Urology;  Laterality: N/A;   EP IMPLANTABLE DEVICE  2009   LEFT HEART CATH AND CORONARY ANGIOGRAPHY N/A 02/15/2016   Procedure: Left Heart Cath and Coronary Angiography;  Surgeon: Burnell Blanks, MD;  Location: [facility];  Service: Cardiovascular;  Laterality: N/A;   TONSILLECTOMY     TRANSURETHRAL RESECTION OF PROSTATE N/A 07/12/2014   Procedure: TRANSURETHRAL RESECTION OF THE PROSTATE (TURP);  Surgeon: Raynelle Bring, MD;  [facility]: [facility];  Service: Urology;  Laterality: N/A;    Current Outpatient Medications  Medication Sig Dispense Refill   acetaminophen (TYLENOL) 325 MG tablet Take 2 tablets (650 mg total) by mouth every 6 (six) hours as needed for mild pain (or Fever >/= 101). 30 tablet 0   amiodarone (PACERONE) 200 MG tablet TAKE ONE TABLET BY MOUTH ONCE DAILY EXCEPT SUNDAYS. 90 tablet 1   atorvastatin (LIPITOR) 10 MG  tablet TAKE 1 TABLET BY MOUTH EVERY DAY 90 tablet 1   carvedilol (COREG) 6.25 MG tablet TAKE 1 TABLET BY MOUTH TWICE A DAY 180 tablet 2   docusate sodium (COLACE) 100 MG capsule Take 100 mg by mouth daily as needed for mild constipation or moderate constipation.     empagliflozin (JARDIANCE) 10 MG TABS tablet Take 1 tablet (10 mg total) by mouth daily before breakfast. 90 tablet 3   ENTRESTO 24-26 MG TAKE 1 TABLET BY MOUTH TWICE A DAY. PLEASE KEEP APPOINTMENT 180 tablet 3   finasteride (PROSCAR) 5 MG tablet Take 5 mg by mouth daily.  11   furosemide (LASIX) 40 MG tablet Take 1 tablet (40 mg total) by mouth every other day. Take potassium same days you administer your lasix. 20 tablet 1   gabapentin (NEURONTIN) 300 MG capsule Take 300 mg by mouth daily as needed.     potassium chloride (KLOR-CON) 10 MEQ tablet Take 1 tablet (10 mEq total) by mouth every other day. Take this same days you administer your every other day lasix. 20 tablet 1   spironolactone (ALDACTONE) 25 MG tablet TAKE 0.5 TABLETS (12.5 MG TOTAL) BY MOUTH DAILY. PLEASE KEEP UPCOMING APPT IN AUGUST 45 tablet 1   tamsulosin (FLOMAX) 0.4 MG CAPS capsule Take 0.4 mg by mouth daily as needed.      No current facility-administered medications for this visit.    Allergies  Allergen Reactions   Codeine Nausea Only    Review of Systems negative except from HPI and PMH  Physical Exam: BP 110/60   Pulse 69   Ht 6' (1.829 m)   Wt 220 lb 6.4 oz (100 kg)   SpO2 95%   BMI 29.89 kg/m   Well developed and well nourished in no acute distress HENT normal Neck supple with JVP-<10 Clear Device pocket well healed; without hematoma or erythema.  There is no tethering  Regular rate and rhythm, no  gallop No  murmur Abd-soft with active BS No Clubbing cyanosis  edema Skin-warm and dry A & Oriented  Grossly normal sensory and motor function  ECG sinus rhythm with competitive ventricular pacing  Asssessment and  Plan  Sinus node  dysfunction  First-degree AV block  Pacemaker-Medtronic    PVCs on amiodarone approximately 1% based on pacemaker  HTN  Renal insufficiency grade 3  NICM  Congestive heart failure-chronic-systolic-class 3K-1S  Aortic root dilatation with central AI  High Risk Medication Surveillance Amiodarone   Pancreatic mass  PVCs are quiescient.  We will continue amiodarone.  Dyspneic and mildly volume overloaded--we will increase his diuretics with some  trepidation given his renal issues.  We will go from Lasix 40 every other day-daily.  Lengthy discussion regarding upgrade of his device to high-voltage versus low voltage.  We reviewed data from [place], as well as some of the meta-analysis indicating the benefits of 8 but not precluding the based on age.  Moreover, we reviewed that these data are prior to [facility] as so augmented benefits would be anticipated.  60% of the patients are on spironolactone.  Furthermore, the issue of surveillance of his pancreatic mass would be challenging with an abandoned lead.  His ejection fraction is borderline at 30-35% and so do not feel strongly that there is data to support ICD upgrade in him --he will let us know what he ultimately decides.  He was also a candidate for baricitinib based on his elevated BNP.  This would need to be delayed until about 3 months following generator replacement.  He has wife are both enthusiastic  Continue his carvedilol 6.25, Entresto 24/26 and spironolactone 12.5.  We will also continue Jardiance 10

## 2020-10-27 ENCOUNTER — Telehealth: Payer: Self-pay

## 2020-10-27 ENCOUNTER — Other Ambulatory Visit: Payer: Self-pay

## 2020-10-27 MED ORDER — EMPAGLIFLOZIN 10 MG PO TABS: 10.0000 mg | ORAL_TABLET | Freq: Every day | ORAL | 3 refills | Status: DC

## 2020-10-27 NOTE — Telephone Encounter (Signed)
Pt assistance forms and Jardiance RX faxed to [facility] cares.

## 2020-10-27 NOTE — Telephone Encounter (Signed)
-----   Message from Patrick Sprang, MD sent at 10/27/2020  8:53 AM EDT ----- Please Inform Patient  Labs are normal x mild increase in Cr  this can be seen with the addition of Jardiance,  lets plan to recheck the blood in about 2 weeks    Thanks

## 2020-10-27 NOTE — Telephone Encounter (Signed)
Spoke with pt and advised of lab results per Dr Caryl Comes as below.  Lab appointment scheduled for repeat BMET on 11/16/2020.  Pt verbalizes understanding and agrees with current plan.

## 2020-10-27 NOTE — Telephone Encounter (Signed)
**Note De-Identified Patrick Weaver Obfuscation** The pt left his completed [employer] Pt Asst application for Jardiance at the office with documents.  I have completed the providers page of his application and have emailed all to Dr Alan Ripper nurse with a request to print a [employer], obtain Dr Alan Ripper signature on the RX and application, date it, and to fax all to [facility] at the fax number written on the cover letter included or to place in the to be faxed basket in Medical Records to be faxed.

## 2020-11-02 NOTE — Telephone Encounter (Signed)
**Note De-Identified Lovell Roe Obfuscation** Letter received from [employer] pt asst Foundation stating that they have approved the pt for Entresto asst for the remainder of this year. Patient ID: [id number]  The letter states that they have notified the pt of this approval as well.

## 2020-11-03 NOTE — Pre-Procedure Instructions (Signed)
Attempted to call patient regarding procedure instructions.  No answer 

## 2020-11-04 ENCOUNTER — Other Ambulatory Visit: Payer: Self-pay

## 2020-11-04 ENCOUNTER — Encounter (HOSPITAL_COMMUNITY): Payer: Self-pay | Admitting: Internal Medicine

## 2020-11-04 ENCOUNTER — Encounter (HOSPITAL_COMMUNITY)
Admission: RE | Disposition: A | Discharge status: Home / Self Care | Payer: Self-pay | Source: Home / Self Care | Attending: Internal Medicine

## 2020-11-04 ENCOUNTER — Ambulatory Visit (HOSPITAL_COMMUNITY)
Admission: RE | Admit: 2020-11-04 | Discharge: 2020-11-04 | Disposition: A | Discharge status: Home / Self Care | Payer: Medicare HMO | Attending: Internal Medicine | Admitting: Internal Medicine

## 2020-11-04 DIAGNOSIS — I5022 Chronic systolic (congestive) heart failure: Secondary | ICD-10-CM | POA: Insufficient documentation

## 2020-11-04 DIAGNOSIS — Z885 Allergy status to narcotic agent status: Secondary | ICD-10-CM | POA: Diagnosis not present

## 2020-11-04 DIAGNOSIS — I495 Sick sinus syndrome: Secondary | ICD-10-CM | POA: Diagnosis not present

## 2020-11-04 DIAGNOSIS — I110 Hypertensive heart disease with heart failure: Secondary | ICD-10-CM | POA: Diagnosis not present

## 2020-11-04 DIAGNOSIS — Z79899 Other long term (current) drug therapy: Secondary | ICD-10-CM | POA: Diagnosis not present

## 2020-11-04 DIAGNOSIS — Z45010 Encounter for checking and testing of cardiac pacemaker pulse generator [battery]: Secondary | ICD-10-CM | POA: Diagnosis not present

## 2020-11-04 DIAGNOSIS — I428 Other cardiomyopathies: Secondary | ICD-10-CM | POA: Diagnosis not present

## 2020-11-04 DIAGNOSIS — I440 Atrioventricular block, first degree: Secondary | ICD-10-CM | POA: Diagnosis not present

## 2020-11-04 DIAGNOSIS — K869 Disease of pancreas, unspecified: Secondary | ICD-10-CM | POA: Diagnosis not present

## 2020-11-04 DIAGNOSIS — N289 Disorder of kidney and ureter, unspecified: Secondary | ICD-10-CM | POA: Insufficient documentation

## 2020-11-04 DIAGNOSIS — G4730 Sleep apnea, unspecified: Secondary | ICD-10-CM | POA: Insufficient documentation

## 2020-11-04 DIAGNOSIS — Z7984 Long term (current) use of oral hypoglycemic drugs: Secondary | ICD-10-CM | POA: Insufficient documentation

## 2020-11-04 DIAGNOSIS — E785 Hyperlipidemia, unspecified: Secondary | ICD-10-CM | POA: Insufficient documentation

## 2020-11-04 DIAGNOSIS — I712 Thoracic aortic aneurysm, without rupture, unspecified: Secondary | ICD-10-CM | POA: Diagnosis not present

## 2020-11-04 HISTORY — PX: PPM GENERATOR CHANGEOUT: EP1233

## 2020-11-04 SURGERY — PPM GENERATOR CHANGEOUT

## 2020-11-04 MED ORDER — CEFAZOLIN SODIUM-DEXTROSE 2-4 GM/100ML-% IV SOLN
2.0000 g | INTRAVENOUS | Status: AC
  Administered 2020-11-04: 2 g via INTRAVENOUS
  Filled 2020-11-04: qty 100

## 2020-11-04 MED ORDER — CHLORHEXIDINE GLUCONATE 4 % EX LIQD
4.0000 "application " | Freq: Once | CUTANEOUS | Status: DC
  Filled 2020-11-04: qty 60

## 2020-11-04 MED ORDER — LIDOCAINE HCL (PF) 1 % IJ SOLN
INTRAMUSCULAR | Status: DC | PRN
  Administered 2020-11-04: 50 mL

## 2020-11-04 MED ORDER — MIDAZOLAM HCL 5 MG/5ML IJ SOLN
INTRAMUSCULAR | Status: AC
  Filled 2020-11-04: qty 5

## 2020-11-04 MED ORDER — CEFAZOLIN SODIUM-DEXTROSE 2-4 GM/100ML-% IV SOLN
INTRAVENOUS | Status: AC
  Filled 2020-11-04: qty 100

## 2020-11-04 MED ORDER — FENTANYL CITRATE (PF) 100 MCG/2ML IJ SOLN
INTRAMUSCULAR | Status: DC | PRN
  Administered 2020-11-04: 50 ug via INTRAVENOUS

## 2020-11-04 MED ORDER — LIDOCAINE HCL 1 % IJ SOLN
INTRAMUSCULAR | Status: AC
  Filled 2020-11-04: qty 60

## 2020-11-04 MED ORDER — SODIUM CHLORIDE 0.9 % IV SOLN
80.0000 mg | INTRAVENOUS | Status: AC
  Administered 2020-11-04: 80 mg
  Filled 2020-11-04: qty 2

## 2020-11-04 MED ORDER — SODIUM CHLORIDE 0.9 % IV SOLN: INTRAVENOUS | Status: DC

## 2020-11-04 MED ORDER — FENTANYL CITRATE (PF) 100 MCG/2ML IJ SOLN
INTRAMUSCULAR | Status: AC
  Filled 2020-11-04: qty 2

## 2020-11-04 MED ORDER — ACETAMINOPHEN 325 MG PO TABS
325.0000 mg | ORAL_TABLET | ORAL | Status: DC | PRN
  Filled 2020-11-04: qty 2

## 2020-11-04 MED ORDER — SODIUM CHLORIDE 0.9 % IV SOLN
INTRAVENOUS | Status: AC
  Filled 2020-11-04: qty 2

## 2020-11-04 MED ORDER — MIDAZOLAM HCL 5 MG/5ML IJ SOLN
INTRAMUSCULAR | Status: DC | PRN
  Administered 2020-11-04: 2 mg via INTRAVENOUS

## 2020-11-04 MED ORDER — POVIDONE-IODINE 10 % EX SWAB: 2.0000 "application " | Freq: Once | CUTANEOUS | Status: DC

## 2020-11-04 SURGICAL SUPPLY — 6 items
CABLE SURGICAL S-101-97-12 (CABLE) ×2 IMPLANT
HEMOSTAT SURGICEL 2X4 FIBR (HEMOSTASIS) ×2 IMPLANT
IPG PACE AZUR XT DR MRI W1DR01 (Pacemaker) ×1 IMPLANT
PACE AZURE XT DR MRI W1DR01 (Pacemaker) ×2 IMPLANT
PAD PRO RADIOLUCENT 2001M-C (PAD) ×2 IMPLANT
TRAY PACEMAKER INSERTION (PACKS) ×2 IMPLANT

## 2020-11-04 NOTE — Discharge Instructions (Signed)

## 2020-11-04 NOTE — Interval H&P Note (Signed)
History and Physical Interval Note:  11/04/2020 1:56 PM  Patrick Weaver  has presented today for surgery, with the diagnosis of eri.  The various methods of treatment have been discussed with the patient and family. After consideration of risks, benefits and other options for treatment, the patient has consented to  Procedure(s): PPM GENERATOR CHANGEOUT (N/A) as a surgical intervention.  The patient's history has been reviewed, patient examined, no change in status, stable for surgery.  I have reviewed the patient's chart and labs.  Questions were answered to the patient's satisfaction.     Virl Axe  Elected to replace with low voltage device

## 2020-11-07 MED FILL — Lidocaine HCl Local Inj 1%: INTRAMUSCULAR | Qty: 50 | Status: AC

## 2020-11-10 DIAGNOSIS — N1831 Chronic kidney disease, stage 3a: Secondary | ICD-10-CM | POA: Diagnosis not present

## 2020-11-10 DIAGNOSIS — I351 Nonrheumatic aortic (valve) insufficiency: Secondary | ICD-10-CM | POA: Diagnosis not present

## 2020-11-10 DIAGNOSIS — I493 Ventricular premature depolarization: Secondary | ICD-10-CM | POA: Diagnosis not present

## 2020-11-10 DIAGNOSIS — Z950 Presence of cardiac pacemaker: Secondary | ICD-10-CM | POA: Diagnosis not present

## 2020-11-10 DIAGNOSIS — I129 Hypertensive chronic kidney disease with stage 1 through stage 4 chronic kidney disease, or unspecified chronic kidney disease: Secondary | ICD-10-CM | POA: Diagnosis not present

## 2020-11-10 DIAGNOSIS — E785 Hyperlipidemia, unspecified: Secondary | ICD-10-CM | POA: Diagnosis not present

## 2020-11-10 DIAGNOSIS — I429 Cardiomyopathy, unspecified: Secondary | ICD-10-CM | POA: Diagnosis not present

## 2020-11-10 DIAGNOSIS — Z79899 Other long term (current) drug therapy: Secondary | ICD-10-CM | POA: Diagnosis not present

## 2020-11-10 DIAGNOSIS — R768 Other specified abnormal immunological findings in serum: Secondary | ICD-10-CM | POA: Diagnosis not present

## 2020-11-11 MED ORDER — EMPAGLIFLOZIN 10 MG PO TABS: 10.0000 mg | ORAL_TABLET | Freq: Every day | ORAL | 3 refills | Status: DC

## 2020-11-11 NOTE — Telephone Encounter (Signed)
Application and script re faxed with confirmation of receiving fax ./cy

## 2020-11-11 NOTE — Telephone Encounter (Signed)
Leaving pt 2 boxes of Jardiance 10 mg tablets at the front desk for pt to pick up.  Lot: [id number]   Exp: 06/2022

## 2020-11-11 NOTE — Addendum Note (Signed)
Addended by: Devra Dopp E on: 11/11/2020 03:18 PM   Modules accepted: Orders

## 2020-11-11 NOTE — Telephone Encounter (Signed)
**Note De-Identified Hiedi Touchton Obfuscation** Per Asa Lente with [employer] they have not received the pts application for Jardiance.  I have emailed his completed [employer] application with documents to our TTL Nurse with a request for her to print a Jardiance 10mg  RX for #90 with 3 refills, have Dr [name] sign it and the pt asst application and to fax all to [facility] at the fax number written on the cover letter included or to place in the to be faxed basket in Medical Record to be faxed.  The pt is aware Jetaime Pinnix [facility] message.

## 2020-11-16 ENCOUNTER — Ambulatory Visit (INDEPENDENT_AMBULATORY_CARE_PROVIDER_SITE_OTHER): Payer: Medicare HMO

## 2020-11-16 ENCOUNTER — Other Ambulatory Visit: Payer: Medicare HMO | Admitting: *Deleted

## 2020-11-16 ENCOUNTER — Other Ambulatory Visit: Payer: Self-pay

## 2020-11-16 DIAGNOSIS — I10 Essential (primary) hypertension: Secondary | ICD-10-CM

## 2020-11-16 DIAGNOSIS — I493 Ventricular premature depolarization: Secondary | ICD-10-CM | POA: Diagnosis not present

## 2020-11-16 DIAGNOSIS — Z79899 Other long term (current) drug therapy: Secondary | ICD-10-CM

## 2020-11-16 DIAGNOSIS — R7989 Other specified abnormal findings of blood chemistry: Secondary | ICD-10-CM | POA: Diagnosis not present

## 2020-11-16 DIAGNOSIS — I495 Sick sinus syndrome: Secondary | ICD-10-CM

## 2020-11-16 LAB — CUP PACEART INCLINIC DEVICE CHECK
Battery Remaining Longevity: 11.8
Brady Statistic RA Percent Paced: 26 %
Brady Statistic RV Percent Paced: 100 %
Date Time Interrogation Session: 20221116171310
Implantable Lead Implant Date: 20090316
Implantable Lead Implant Date: 20090316
Implantable Lead Location: 753859
Implantable Lead Location: 753860
Implantable Lead Model: 5076
Implantable Lead Model: 5076
Implantable Pulse Generator Implant Date: 20221104
Lead Channel Pacing Threshold Amplitude: 0.5 V
Lead Channel Pacing Threshold Amplitude: 0.75 V
Lead Channel Pacing Threshold Pulse Width: 0.4 ms
Lead Channel Pacing Threshold Pulse Width: 0.4 ms
Lead Channel Sensing Intrinsic Amplitude: 1.5 mV
Lead Channel Sensing Intrinsic Amplitude: 6.4 mV
Lead Channel Setting Pacing Amplitude: 2 V
Lead Channel Setting Pacing Amplitude: 2 V
Lead Channel Setting Pacing Pulse Width: 0.4 ms
Lead Channel Setting Sensing Sensitivity: 4 mV

## 2020-11-16 LAB — BASIC METABOLIC PANEL
BUN/Creatinine Ratio: 14 (ref 10–24)
BUN: 23 mg/dL (ref 8–27)
CO2: 26 mmol/L (ref 20–29)
Calcium: 10.1 mg/dL (ref 8.6–10.2)
Chloride: 105 mmol/L (ref 96–106)
Creatinine, Ser: 1.62 mg/dL — ABNORMAL HIGH (ref 0.76–1.27)
Glucose: 98 mg/dL (ref 70–99)
Potassium: 4.6 mmol/L (ref 3.5–5.2)
Sodium: 141 mmol/L (ref 134–144)
eGFR: 45 mL/min/{1.73_m2} — ABNORMAL LOW (ref >59)

## 2020-11-16 NOTE — Progress Notes (Signed)
Wound check appointment. Dermabond removed Wound without redness or edema. Incision edges approximated, wound well healed however small incisional scab removed with dermabond. Patient instructed to keep wound dry for 48 more hours. He understands to contact device clinic with any s/s of infection.  Normal device function. Thresholds, sensing, and impedances consistent with implant measurements. Device outputs programmed for chronic lead settings. Histogram distribution appropriate for patient and level of activity. No mode switches or high ventricular rates noted. Patient educated about wound care, arm mobility, lifting restrictions. Patient enrolled in remote monitoring. Assisted with transmission today as patient did not have correct monitor. Confirmed he is paired with correct monitor today. Next remote 02/08/20. 91 day ROV follow up with Dr. Caryl Comes 02/13/21.

## 2020-11-16 NOTE — Progress Notes (Signed)
BMET per Dr Caryl Comes for elevated Creatinine

## 2020-11-16 NOTE — Patient Instructions (Addendum)
   After Your Pacemaker   Monitor your pacemaker site for redness, swelling, and drainage. Call the device clinic at [phone] if you experience these symptoms or fever/chills.  Your incision was closed with Dermabond:  You may shower 1 day after your defibrillator implant and wash your incision with soap and water. Avoid lotions, ointments, or perfumes over your incision until it is well-healed. Please refrain from showering or wetting incision for another 48 hours  You may use a hot tub or a pool after your wound check appointment after the incision is completely closed.   You may drive, unless driving has been restricted by your healthcare providers.  Your Pacemaker is MRI compatible.  Remote monitoring is used to monitor your pacemaker from home. This monitoring is scheduled every 91 days by our office. It allows [place] to keep an eye on the functioning of your device to ensure it is working properly. You will routinely see your Electrophysiologist annually (more often if necessary).

## 2020-11-17 NOTE — Telephone Encounter (Signed)
Paperwork signed and faxed.

## 2020-11-17 NOTE — Telephone Encounter (Signed)
**Note De-Identified Aylssa Herrig Obfuscation** Per Valetta Fuller at [facility] the pts application can not be accepted because it is an old version of their application.  The pt completed a new version of the [employer] C[employer] pt asst application for Jardiance, I have completed the providers page and have e-mailed all to our TTL for today so she can get Dr Oralia Rud signature as DOD in Dr Alan Ripper absence and so she can fax all to [place] at the fax number written on the cover letter included or to place in the to be faxed basket in Medical Records to be faxed.

## 2020-11-18 ENCOUNTER — Other Ambulatory Visit (HOSPITAL_COMMUNITY): Payer: Self-pay | Admitting: Gastroenterology

## 2020-11-18 ENCOUNTER — Other Ambulatory Visit: Payer: Self-pay | Admitting: Gastroenterology

## 2020-11-18 DIAGNOSIS — N183 Chronic kidney disease, stage 3 unspecified: Secondary | ICD-10-CM | POA: Diagnosis not present

## 2020-11-18 DIAGNOSIS — Z86010 Personal history of colonic polyps: Secondary | ICD-10-CM | POA: Diagnosis not present

## 2020-11-18 DIAGNOSIS — K862 Cyst of pancreas: Secondary | ICD-10-CM | POA: Diagnosis not present

## 2020-11-18 DIAGNOSIS — Z950 Presence of cardiac pacemaker: Secondary | ICD-10-CM | POA: Diagnosis not present

## 2020-11-18 DIAGNOSIS — I5022 Chronic systolic (congestive) heart failure: Secondary | ICD-10-CM | POA: Diagnosis not present

## 2020-11-26 ENCOUNTER — Encounter: Payer: Self-pay | Admitting: Internal Medicine

## 2020-12-02 DIAGNOSIS — Z481 Encounter for planned postprocedural wound closure: Secondary | ICD-10-CM | POA: Diagnosis not present

## 2020-12-02 DIAGNOSIS — C44311 Basal cell carcinoma of skin of nose: Secondary | ICD-10-CM | POA: Diagnosis not present

## 2020-12-02 DIAGNOSIS — C4491 Basal cell carcinoma of skin, unspecified: Secondary | ICD-10-CM | POA: Diagnosis not present

## 2020-12-09 ENCOUNTER — Telehealth: Payer: Self-pay | Admitting: *Deleted

## 2020-12-09 NOTE — Telephone Encounter (Addendum)
Left message for patient to return call about Batwire Study  Jasmine Pang, RN BSN [place]: [phone]    2:01 PM  Patient called back, I missed his return call earlier today.  Spoke with patient.  He stated that he is feeling a lot better since he had his generator changes and he wants to wait to make a decision after he sees Dr. Caryl Comes in 02/2021

## 2020-12-15 ENCOUNTER — Ambulatory Visit: Payer: Medicare HMO | Admitting: Internal Medicine

## 2020-12-15 ENCOUNTER — Encounter: Payer: Self-pay | Admitting: Internal Medicine

## 2020-12-15 ENCOUNTER — Other Ambulatory Visit: Payer: Self-pay

## 2020-12-15 VITALS — BP 116/70 | HR 85 | Ht 72.0 in | Wt 220.4 lb

## 2020-12-15 DIAGNOSIS — I495 Sick sinus syndrome: Secondary | ICD-10-CM | POA: Diagnosis not present

## 2020-12-15 DIAGNOSIS — R0602 Shortness of breath: Secondary | ICD-10-CM

## 2020-12-15 DIAGNOSIS — G4733 Obstructive sleep apnea (adult) (pediatric): Secondary | ICD-10-CM | POA: Diagnosis not present

## 2020-12-15 NOTE — Progress Notes (Signed)
Patrick Weaver    [id number]    12-Dec-1945  Primary Care Physician:Wong, Edwyna Shell, MD Date of Appointment: 12/15/2020 Established Patient Visit  Chief complaint:   Chief Complaint  Patient presents with   Follow-up    2 mo f/u for SOB. States his SOB has improved since last visit, especially after having his pacemaker replaced.      HPI: Patrick Weaver is a 75 y.o. man with OSA, Heart failure preserved ejection fraction, atrial fibrillation s/p PPM on admiodarone.   Interval Updates: Here for follow up after generator change out. He is feeling much better.  No chest tightness, coughing wheezing. Dyspnea and fatigue have improved since PPM generator change out.  Also has a pancreatic mass that he is currently working up - MRCP scheduled in January.   Started jardiance since I last saw him for diabetes  I have reviewed the patient's family social and past medical history and updated as appropriate.   Past Medical History:  Diagnosis Date   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   CT 12/18: Ascending aorta 4.5 cm, aortic root 4.6 cm , approximately 4.3 cm  (05/04/19)          Bladder stone    BPH (benign prostatic hyperplasia)    Bradycardia    a.s/p MDT dual chamber pacemaker   Chronic systolic heart failure ([place]) 08/13/2015   Echo 10/18: Moderate LVH, EF 25, mild AI, aortic annulus dilated at 53 mm, sinotubular junction is 34 mm, proximal ascending aorta is 39 mm // Echo 12/17: Mild LVH, EF 25-30, diffuse HK, mild AI, mild MR, severe LAE, mild RAE, PASP 42 // Echo 7/17: EF 25-30, severe diffuse HK (disproportionately severe HK of the inferolateral and inferior myocardium), mild to moderate AI, aortic root 44 mm, ascend   Dyslipidemia    failed niaspan   History of echocardiogram    Echo 09/26/2017: EF 25-30, normal wall motion, grade 2 diastolic dysfunction, mild AI, severe LAE, mildly reduced RVSF   HTN (hypertension)    NICM (nonischemic cardiomyopathy) (HCC)     LHC2/18: no angiographic CAD   PVCs (premature ventricular contractions)    controlled on Amiodarone // Holter 11/17: PVCs 1.8%   Sleep apnea    USES C-PAP    Past Surgical History:  Procedure Laterality Date   CYSTOSCOPY WITH LITHOLAPAXY N/A 07/12/2014   Procedure: CYSTOSCOPY WITH LITHOLAPAXY;  Surgeon: Raynelle Bring, MD;  Location: [facility];  Service: Urology;  Laterality: N/A;   EP IMPLANTABLE DEVICE  2009   LEFT HEART CATH AND CORONARY ANGIOGRAPHY N/A 02/15/2016   Procedure: Left Heart Cath and Coronary Angiography;  Surgeon: Burnell Blanks, MD;  Location: [place] [facility];  Service: Cardiovascular;  Laterality: N/A;   PPM GENERATOR CHANGEOUT N/A 11/04/2020   Procedure: PPM GENERATOR CHANGEOUT;  Surgeon: Deboraha Sprang, MD;  Location: [place] [facility];  Service: Cardiovascular;  Laterality: N/A;   TONSILLECTOMY     TRANSURETHRAL RESECTION OF PROSTATE N/A 07/12/2014   Procedure: TRANSURETHRAL RESECTION OF THE PROSTATE (TURP);  Surgeon: Raynelle Bring, MD;  Location: [facility];  Service: Urology;  Laterality: N/A;    Family History  Problem Relation Age of Onset   Hypertension Mother    Cancer Mother    Congestive Heart Failure Father    Heart disease Father    Heart attack Father    Colon cancer Maternal Grandfather    Hypertension Brother    Stomach cancer  Neg Hx    Esophageal cancer Neg Hx     Social History   Occupational History   Not on file  Tobacco Use   Smoking status: Never   Smokeless tobacco: Never  Vaping Use   Vaping Use: Never used  Substance and Sexual Activity   Alcohol use: No   Drug use: No   Sexual activity: Not on file     Physical Exam: Blood pressure 116/70, pulse 85, height 6' (1.829 m), weight 220 lb 6.4 oz (100 kg), SpO2 96 %.  Gen:      No acute distress ENT:  no nasal polyps, mucus membranes moist Lungs:    No increased respiratory effort, symmetric chest wall excursion, clear to auscultation bilaterally, no wheezes or  crackles CV:         Regular rate and rhythm; no murmurs, rubs, or gallops.  No pedal edema   Data Reviewed: Imaging: I have previously personally reviewed the CT Chest from June 2018 which shows mild right basilar atelectasis/postinfectious scarring   PFTs:  PFT Results Latest Ref Rng & Units 08/22/2020 02/04/2017 11/01/2016 05/31/2016  FVC-Pre L 3.21 3.77 3.52 3.63  FVC-Predicted Pre % 70 78 79 81  FVC-Post L 3.28 - 3.39 3.94  FVC-Predicted Post % 72 - 76 88  Pre FEV1/FVC % % 76 80 83 79  Post FEV1/FCV % % 77 - 82 78  FEV1-Pre L 2.45 3.01 2.93 2.87  FEV1-Predicted Pre % 73 84 89 87  FEV1-Post L 2.54 - 2.78 3.07  DLCO uncorrected ml/min/mmHg 18.35 22.25 19.19 21.20  DLCO UNC% % 69 63 59 66  DLCO corrected ml/min/mmHg 18.35 - - 20.97  DLCO COR %Predicted % 69 - - 65  DLVA Predicted % 85 79 81 78  TLC L 5.87 6.51 6.26 6.50  TLC % Predicted % 81 87 89 93  RV % Predicted % 101 97 118 101   I have personally reviewed the patient's PFTs and shows normal pulmonary function.  Labs:  Lab Results  Component Value Date   WBC 7.0 10/26/2020   HGB 14.7 10/26/2020   HCT 43.6 10/26/2020   MCV 94 10/26/2020   PLT 143 (L) 10/26/2020   Lab Results  Component Value Date   NA 141 11/16/2020   K 4.6 11/16/2020   CL 105 11/16/2020   CO2 26 11/16/2020     Immunization status: Immunization History  Administered Date(s) Administered   Fluad Quad(high Dose 65+) 12/11/2019, 10/18/2020   Influenza, High Dose Seasonal PF 09/24/2017   Influenza,inj,Quad PF,6+ Mos 10/30/2013   PFIZER(Purple Top)SARS-COV-2 Vaccination 02/07/2019, 03/04/2019, 10/15/2019   Pfizer Covid-19 Vaccine Bivalent Booster 51yrs & up 09/21/2020    Assessment:  Shortness of breath - improved.  Chronotropic dependence OSA on bipap  Plan/Recommendations: No evidence of parenchymal lung disease. PFTs with normal pulmonary function.  Dyspnea improved with generator changeout. I suspect his chrontropic dependence was  largely the cause of his symptoms. I am happy to see him back as needed if symptoms change.    Return to Care:  PRN   Lenice Llamas, MD Pulmonary and [place]

## 2021-01-06 ENCOUNTER — Encounter: Payer: Self-pay | Admitting: Internal Medicine

## 2021-01-12 ENCOUNTER — Ambulatory Visit (HOSPITAL_COMMUNITY)
Admission: RE | Admit: 2021-01-12 | Discharge: 2021-01-12 | Disposition: A | Discharge status: Home / Self Care | Payer: Medicare HMO | Source: Ambulatory Visit | Attending: Gastroenterology | Admitting: Gastroenterology

## 2021-01-12 DIAGNOSIS — K862 Cyst of pancreas: Secondary | ICD-10-CM | POA: Insufficient documentation

## 2021-01-12 DIAGNOSIS — K5730 Diverticulosis of large intestine without perforation or abscess without bleeding: Secondary | ICD-10-CM | POA: Diagnosis not present

## 2021-01-12 DIAGNOSIS — K760 Fatty (change of) liver, not elsewhere classified: Secondary | ICD-10-CM | POA: Diagnosis not present

## 2021-01-12 DIAGNOSIS — N281 Cyst of kidney, acquired: Secondary | ICD-10-CM | POA: Diagnosis not present

## 2021-01-12 DIAGNOSIS — R161 Splenomegaly, not elsewhere classified: Secondary | ICD-10-CM | POA: Diagnosis not present

## 2021-01-12 NOTE — Progress Notes (Signed)
Informed of MRI for today.   Device system confirmed to be MRI conditional, with implant date > 6 weeks ago, and no evidence of abandoned or epicardial leads in review of most recent CXR Interrogation from today reviewed, pt is currently AP-VS at ~80 bpm Change device settings for MRI to DOO at 85 bpm (HR improved to 70 per staff)  Tachy-therapies to off if applicable.  Program device back to pre-MRI settings after completion of exam.  Annamaria Helling  01/12/2021 12:33 PM

## 2021-01-12 NOTE — Progress Notes (Signed)
Per order, Changed device settings for MRI to DOO at 85 bpm (HR improved to 70 per staff) once we hear back from [employer].   Tachy-therapies to off if applicable.   Will program device back to pre-MRI settings after completion of exam.

## 2021-01-24 ENCOUNTER — Other Ambulatory Visit: Payer: Self-pay | Admitting: Internal Medicine

## 2021-01-27 DIAGNOSIS — H52203 Unspecified astigmatism, bilateral: Secondary | ICD-10-CM | POA: Diagnosis not present

## 2021-01-27 DIAGNOSIS — H5212 Myopia, left eye: Secondary | ICD-10-CM | POA: Diagnosis not present

## 2021-01-27 DIAGNOSIS — H5201 Hypermetropia, right eye: Secondary | ICD-10-CM | POA: Diagnosis not present

## 2021-01-27 DIAGNOSIS — H2513 Age-related nuclear cataract, bilateral: Secondary | ICD-10-CM | POA: Diagnosis not present

## 2021-01-27 DIAGNOSIS — H25043 Posterior subcapsular polar age-related cataract, bilateral: Secondary | ICD-10-CM | POA: Diagnosis not present

## 2021-01-27 DIAGNOSIS — H35373 Puckering of macula, bilateral: Secondary | ICD-10-CM | POA: Diagnosis not present

## 2021-01-27 DIAGNOSIS — H5371 Glare sensitivity: Secondary | ICD-10-CM | POA: Diagnosis not present

## 2021-01-27 DIAGNOSIS — H18003 Unspecified corneal deposit, bilateral: Secondary | ICD-10-CM | POA: Diagnosis not present

## 2021-01-27 DIAGNOSIS — H524 Presbyopia: Secondary | ICD-10-CM | POA: Diagnosis not present

## 2021-02-07 ENCOUNTER — Ambulatory Visit (INDEPENDENT_AMBULATORY_CARE_PROVIDER_SITE_OTHER): Payer: Medicare HMO

## 2021-02-07 DIAGNOSIS — I428 Other cardiomyopathies: Secondary | ICD-10-CM | POA: Diagnosis not present

## 2021-02-07 LAB — CUP PACEART REMOTE DEVICE CHECK
Battery Remaining Longevity: 150 mo
Battery Voltage: 3.18 V
Brady Statistic AP VP Percent: 17.74 %
Brady Statistic AP VS Percent: 81.66 %
Brady Statistic AS VP Percent: 0.14 %
Brady Statistic AS VS Percent: 0.45 %
Brady Statistic RA Percent Paced: 99.61 %
Brady Statistic RV Percent Paced: 17.89 %
Date Time Interrogation Session: 20230206223349
Implantable Lead Implant Date: 20090316
Implantable Lead Implant Date: 20090316
Implantable Lead Location: 753859
Implantable Lead Location: 753860
Implantable Lead Model: 5076
Implantable Lead Model: 5076
Implantable Pulse Generator Implant Date: 20221104
Lead Channel Impedance Value: 342 Ohm
Lead Channel Impedance Value: 399 Ohm
Lead Channel Impedance Value: 418 Ohm
Lead Channel Impedance Value: 494 Ohm
Lead Channel Pacing Threshold Amplitude: 0.5 V
Lead Channel Pacing Threshold Amplitude: 0.75 V
Lead Channel Pacing Threshold Pulse Width: 0.4 ms
Lead Channel Pacing Threshold Pulse Width: 0.4 ms
Lead Channel Sensing Intrinsic Amplitude: 1 mV
Lead Channel Sensing Intrinsic Amplitude: 1 mV
Lead Channel Sensing Intrinsic Amplitude: 8.125 mV
Lead Channel Sensing Intrinsic Amplitude: 8.125 mV
Lead Channel Setting Pacing Amplitude: 1.5 V
Lead Channel Setting Pacing Amplitude: 2 V
Lead Channel Setting Pacing Pulse Width: 0.4 ms
Lead Channel Setting Sensing Sensitivity: 4 mV

## 2021-02-10 NOTE — Progress Notes (Signed)
Remote pacemaker transmission.   

## 2021-02-13 ENCOUNTER — Ambulatory Visit (INDEPENDENT_AMBULATORY_CARE_PROVIDER_SITE_OTHER): Payer: Medicare HMO | Admitting: Internal Medicine

## 2021-02-13 ENCOUNTER — Other Ambulatory Visit: Payer: Self-pay

## 2021-02-13 VITALS — BP 90/60 | HR 81 | Ht 72.0 in | Wt 219.0 lb

## 2021-02-13 DIAGNOSIS — Z79899 Other long term (current) drug therapy: Secondary | ICD-10-CM

## 2021-02-13 DIAGNOSIS — E032 Hypothyroidism due to medicaments and other exogenous substances: Secondary | ICD-10-CM

## 2021-02-13 DIAGNOSIS — I428 Other cardiomyopathies: Secondary | ICD-10-CM

## 2021-02-13 DIAGNOSIS — Z950 Presence of cardiac pacemaker: Secondary | ICD-10-CM | POA: Diagnosis not present

## 2021-02-13 DIAGNOSIS — I5022 Chronic systolic (congestive) heart failure: Secondary | ICD-10-CM

## 2021-02-13 DIAGNOSIS — I495 Sick sinus syndrome: Secondary | ICD-10-CM

## 2021-02-13 NOTE — Patient Instructions (Signed)
Medication Instructions:  Your physician recommends that you continue on your current medications as directed. Please refer to the Current Medication list given to you today.  *If you need a refill on your cardiac medications before your next appointment, please call your pharmacy*   Lab Work: Liver Panel and TSH today If you have labs (blood work) drawn today and your tests are completely normal, you will receive your results only by: [place] (if you have MyChart) OR A paper copy in the mail If you have any lab test that is abnormal or we need to change your treatment, we will call you to review the results.   Testing/Procedures: None ordered.    Follow-Up: At [facility], you and your health needs are our priority.  As part of our continuing mission to provide you with exceptional heart care, we have created designated Provider Care Teams.  These Care Teams include your primary Cardiologist (physician) and Advanced Practice Providers (APPs -  Physician Assistants and Nurse Practitioners) who all work together to provide you with the care you need, when you need it.  We recommend signing up for the patient portal called "MyChart".  Sign up information is provided on this After Visit Summary.  MyChart is used to connect with patients for Virtual Visits (Telemedicine).  Patients are able to view lab/test results, encounter notes, upcoming appointments, etc.  Non-urgent messages can be sent to your provider as well.   To learn more about what you can do with MyChart, go to [email or web address].    Your next appointment:   6 months with Dr Caryl Comes

## 2021-02-13 NOTE — Progress Notes (Signed)
Patient ID: Patrick Weaver, male   DOB: 1945-06-09, 76 y.o.   MRN: [id number] o     Patient Care Team: Vernie Shanks, MD as PCP - General (Family Medicine) Fay Records, MD as PCP - Cardiology (Cardiology) Deboraha Sprang, MD as PCP - Electrophysiology (Cardiology)   HPI  Patrick Weaver is a 76 y.o. male seen in followup for pacer ([employer]) implantation for bradycardia. Gen change 11/22. Significant problems with PVCs treated initially with flecainide with much improvement. As noted below, interval decrease in left ventricular function  prompted the discontinuation of flecainide and  initiation of amiodarone. PVCs were obliterated but without interval improvement in LVEF. Hence he underwent catheterization.  8/22, discussions regarding upgrade of his pacemaker to high-voltage given his modest depression LV function.  It was elected, then in the context of Entresto therapy and his borderline ejection fraction and age in the context of the [place] trial that we will hold off on upgrade.    Pancreatic mass identified which has had to undergo endoscopic surveillance rather than MRI 2/2  Device    Dyspnea is much improved and so will hold off on Batwire consideration No edema, no chest pain.  No palpitations. Patient denies symptoms of GI intolerance, sun sensitivity, neurological symptoms attributable to amiodarone.      DATE TEST EF%   8/15 Echo  65 %   7/17  Echo   25 % LAE (52/2.5/35)  12/17 Echo  25% AI  mild   2/18 Cath  No obstructive CAD  8/18 Echo  20-25%    12/18 CT Aorta  4.5 cm Aneruysm  9/19 Echo  25-30% LAE severe (57/2.6/28)  10/21 Echo  35% AoRoot 46 mm  8/22 Echo  30-35% LAE severe     Date Cr K Hgb TSH LFTs PFTs BNP  10/17     3.19 29     5/18  1.37   2.47 18 DLCO stable   9/19 1.28   2.72 17    10/21 1.26 (4/21)  14.2(12/21) 4.29 13    2/22 1.25 4.3       10/22 1.55 4.5   2.93 (8/22) 20 (8/22)  884  11/22 1.62  14.7        Past Medical History:   Diagnosis Date   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   CT 12/18: Ascending aorta 4.5 cm, aortic root 4.6 cm , approximately 4.3 cm  (05/04/19)          Bladder stone    BPH (benign prostatic hyperplasia)    Bradycardia    a.s/p MDT dual chamber pacemaker   Chronic systolic heart failure ([place]) 08/13/2015   Echo 10/18: Moderate LVH, EF 25, mild AI, aortic annulus dilated at 53 mm, sinotubular junction is 34 mm, proximal ascending aorta is 39 mm // Echo 12/17: Mild LVH, EF 25-30, diffuse HK, mild AI, mild MR, severe LAE, mild RAE, PASP 42 // Echo 7/17: EF 25-30, severe diffuse HK (disproportionately severe HK of the inferolateral and inferior myocardium), mild to moderate AI, aortic root 44 mm, ascend   Dyslipidemia    failed niaspan   History of echocardiogram    Echo 09/26/2017: EF 25-30, normal wall motion, grade 2 diastolic dysfunction, mild AI, severe LAE, mildly reduced RVSF   HTN (hypertension)    NICM (nonischemic cardiomyopathy) (HCC)    LHC2/18: no angiographic CAD   PVCs (premature ventricular contractions)    controlled on Amiodarone // Holter 11/17: PVCs 1.8%  Sleep apnea    USES C-PAP    Past Surgical History:  Procedure Laterality Date   CYSTOSCOPY WITH LITHOLAPAXY N/A 07/12/2014   Procedure: CYSTOSCOPY WITH LITHOLAPAXY;  Surgeon: Raynelle Bring, MD;  [facility];  Service: Urology;  Laterality: N/A;   EP IMPLANTABLE DEVICE  2009   LEFT HEART CATH AND CORONARY ANGIOGRAPHY N/A 02/15/2016   Procedure: Left Heart Cath and Coronary Angiography;  Surgeon: Burnell Blanks, MD;  Location: [place] [facility];  Service: Cardiovascular;  Laterality: N/A;   PPM GENERATOR CHANGEOUT N/A 11/04/2020   Procedure: PPM GENERATOR CHANGEOUT;  Surgeon: Deboraha Sprang, MD;  Location: [place] [facility];  Service: Cardiovascular;  Laterality: N/A;   TONSILLECTOMY     TRANSURETHRAL RESECTION OF PROSTATE N/A 07/12/2014   Procedure: TRANSURETHRAL RESECTION OF THE PROSTATE (TURP);   Surgeon: Raynelle Bring, MD;  [facility];  Service: Urology;  Laterality: N/A;    Current Outpatient Medications  Medication Sig Dispense Refill   acetaminophen (TYLENOL) 325 MG tablet Take 2 tablets (650 mg total) by mouth every 6 (six) hours as needed for mild pain (or Fever >/= 101). 30 tablet 0   amiodarone (PACERONE) 200 MG tablet TAKE ONE TABLET BY MOUTH ONCE DAILY EXCEPT SUNDAYS. 90 tablet 1   atorvastatin (LIPITOR) 10 MG tablet TAKE 1 TABLET BY MOUTH EVERY DAY 90 tablet 1   carvedilol (COREG) 6.25 MG tablet TAKE 1 TABLET BY MOUTH TWICE A DAY 180 tablet 2   docusate sodium (COLACE) 100 MG capsule Take 100 mg by mouth daily as needed for mild constipation or moderate constipation.     empagliflozin (JARDIANCE) 10 MG TABS tablet Take 1 tablet (10 mg total) by mouth daily before breakfast. 90 tablet 3   ENTRESTO 24-26 MG TAKE 1 TABLET BY MOUTH TWICE A DAY. PLEASE KEEP APPOINTMENT 180 tablet 3   finasteride (PROSCAR) 5 MG tablet Take 5 mg by mouth daily.  11   furosemide (LASIX) 40 MG tablet Take 1 tablet (40 mg total) by mouth daily. Take potassium same days you administer your lasix. 90 tablet 1   gabapentin (NEURONTIN) 300 MG capsule Take 300 mg by mouth at bedtime.     potassium chloride (KLOR-CON) 10 MEQ tablet Take 1 tablet (10 mEq total) by mouth daily. Take this same days you administer your every other day lasix. 90 tablet 1   spironolactone (ALDACTONE) 25 MG tablet TAKE 0.5 TABLETS (12.5 MG TOTAL) BY MOUTH DAILY. PLEASE KEEP UPCOMING APPT IN AUGUST 45 tablet 1   tamsulosin (FLOMAX) 0.4 MG CAPS capsule Take 0.4 mg by mouth daily as needed (helps with  urination).     No current facility-administered medications for this visit.    Allergies  Allergen Reactions   Codeine Nausea Only    Review of Systems negative except from HPI and PMH  Physical Exam: BP 90/60    Pulse 81    Ht 6' (1.829 m)    Wt 219 lb (99.3 kg)    SpO2 91%    BMI 29.70 kg/m   Well developed and well  nourished in no acute distress HENT normal Neck supple with JVP-flat Clear Device pocket well healed; without hematoma or erythema.  There is no tethering  Regular rate and rhythm, no  murmur Abd-soft with active BS No Clubbing cyanosis  edema Skin-warm and dry A & Oriented  Grossly normal sensory and motor function  ECG atrial pacing 87 Intervals 32/13/41  Asssessment and  Plan  Sinus  node dysfunction  First-degree AV block-profound  Pacemaker-Medtronic    PVCs on amiodarone approximately 1% based on pacemaker  HTN  Renal insufficiency grade 3  NICM  Congestive heart failure-chronic-systolic-class 6Y-8E  Aortic root dilatation with central AI  High Risk Medication Surveillance Amiodarone   Pancreatic mass  PVCs are quiescient.  No significant history of difference between atrium and ventricle.  We will decrease amiodarone from 6 days a week--5 days a week and check on surveillance laboratories.  Cardiomyopathy is stable.  Symptoms are in fact improved.  Not quite sure why but grateful.  Continue his Entresto 24/26, carvedilol 6.25 twice daily and spironolactone which will increase from 12.5--25 and move at at bedtime.  He remains on Farxiga 10.   Blood pressure is low about 90.  Hence we will change his spironolactone to nighttime.  His amiodarone surveillance labs will allow [place] also to check his creatinine which was elevated at his last check 3 months ago

## 2021-02-14 ENCOUNTER — Telehealth: Payer: Self-pay

## 2021-02-14 LAB — HEPATIC FUNCTION PANEL
ALT: 20 IU/L (ref 0–44)
AST: 15 IU/L (ref 0–40)
Albumin: 4.8 g/dL — ABNORMAL HIGH (ref 3.7–4.7)
Alkaline Phosphatase: 95 IU/L (ref 44–121)
Bilirubin Total: 0.4 mg/dL (ref 0.0–1.2)
Bilirubin, Direct: 0.1 mg/dL (ref 0.00–0.40)
Total Protein: 6.7 g/dL (ref 6.0–8.5)

## 2021-02-14 LAB — TSH: TSH: 3.34 u[IU]/mL (ref 0.450–4.500)

## 2021-02-14 NOTE — Telephone Encounter (Signed)
**Note De-Identified Kanai Berrios Obfuscation** The pts [employer] pt asst application for Entresto and his [employer] pt asst application for Jardiance were both left at the office with documents.  I have completed the providers page of each application and have e-mailed all of both applications to Dr Alan Ripper nurse so she can print prescriptions for Entresto 24-26 mg #180 with 3 refills and for Jardiance 10 mg #90 with 3 refills, obtain Dr Alan Ripper signature and date on the prescriptions and applications, and to fax all to appropriate Pt Asst Foundations at the fax numbers written on cover letter included for each.

## 2021-02-14 NOTE — Addendum Note (Signed)
Addended by: Janan Halter F on: 02/14/2021 04:09 PM   Modules accepted: Orders

## 2021-02-15 MED ORDER — ENTRESTO 24-26 MG PO TABS: ORAL_TABLET | ORAL | 3 refills | Status: DC

## 2021-02-15 MED ORDER — EMPAGLIFLOZIN 10 MG PO TABS: 10.0000 mg | ORAL_TABLET | Freq: Every day | ORAL | 3 refills | Status: DC

## 2021-02-15 NOTE — Telephone Encounter (Signed)
Pt forms faxed and sent to be filled in Med Records.

## 2021-02-15 NOTE — Telephone Encounter (Signed)
Rx printed for Patrick Weaver and Patrick Weaver and paperwork printed off for Pt assisstance from [employer]... will have Dr. [name] sign and will fax this morning.

## 2021-02-16 ENCOUNTER — Other Ambulatory Visit: Payer: Self-pay | Admitting: Internal Medicine

## 2021-02-27 NOTE — Telephone Encounter (Signed)
**Note De-Identified [name] Obfuscation** The pt has been approved for Entresto asst until 12/31/2021 per letter from [facility]. Pt ID: [id number]  The letter states that they have notified the pt of this approval as well.

## 2021-03-07 DIAGNOSIS — H25812 Combined forms of age-related cataract, left eye: Secondary | ICD-10-CM | POA: Diagnosis not present

## 2021-03-07 DIAGNOSIS — H2512 Age-related nuclear cataract, left eye: Secondary | ICD-10-CM | POA: Diagnosis not present

## 2021-03-09 ENCOUNTER — Telehealth: Payer: Self-pay | Admitting: Internal Medicine

## 2021-03-09 NOTE — Telephone Encounter (Signed)
? ?  Pt c/o medication issue: ? ?1. Name of Medication:  ? sacubitril-valsartan (ENTRESTO) 24-26 MG  ? ?2. How are you currently taking this medication (dosage and times per day)? TAKE 1 TABLET BY MOUTH TWICE A DAY. ? ?3. Are you having a reaction (difficulty breathing--STAT)?  ? ?4. What is your medication issue? Pt is has been out of meds for 3 days and according to [employer] it wont arrive till Saturday. Pt wanted to ask, if its ok not to have it till saturday if not if there's available samples for him  ? ? ?

## 2021-03-09 NOTE — Telephone Encounter (Signed)
Spoke with patient, he states he is out of his Patrick Weaver and refill will not be delivered until Saturday (03/11/21). ? ?Advised patient that we will have samples of Entresto 24-'26mg'$  for him to pick up this afternoon at our office. ? ?Patient verbalized understanding. ?

## 2021-03-20 ENCOUNTER — Encounter: Payer: Self-pay | Admitting: Internal Medicine

## 2021-03-21 ENCOUNTER — Telehealth: Payer: Self-pay

## 2021-03-21 DIAGNOSIS — I10 Essential (primary) hypertension: Secondary | ICD-10-CM | POA: Diagnosis not present

## 2021-03-21 DIAGNOSIS — N183 Chronic kidney disease, stage 3 unspecified: Secondary | ICD-10-CM | POA: Diagnosis not present

## 2021-03-21 DIAGNOSIS — I5022 Chronic systolic (congestive) heart failure: Secondary | ICD-10-CM | POA: Diagnosis not present

## 2021-03-21 NOTE — Telephone Encounter (Signed)
Spoke with the pt and he will have his wife pick up his samples tomorrow... I will leave a little more at the front desk to hold him over until he is able to get his supply.  ? ?

## 2021-03-21 NOTE — Telephone Encounter (Signed)
**Note De-Identified Marshaun Lortie Obfuscation** I s/w Melissa with [employer] and she states that the pts Jardiance will not be shipped out until Monday 3/27 if all goes as planned as their pharmacist are behind. ? ?Dr Alan Ripper nurse did provide the pt with a 1 week supply of Jardiance samples today. ? ?If the pt has not picked up yet, I will ask Dr Alan Ripper nurse to leave him 2 weeks of Jardiance samples as he will run out before his arrives in the mail. ?If he has picked up his samples we may need to provide 1 more week if the pt calls back. ?

## 2021-04-02 ENCOUNTER — Other Ambulatory Visit: Payer: Self-pay | Admitting: Internal Medicine

## 2021-04-23 ENCOUNTER — Other Ambulatory Visit: Payer: Self-pay | Admitting: Internal Medicine

## 2021-05-08 DIAGNOSIS — G4733 Obstructive sleep apnea (adult) (pediatric): Secondary | ICD-10-CM | POA: Diagnosis not present

## 2021-05-08 DIAGNOSIS — E785 Hyperlipidemia, unspecified: Secondary | ICD-10-CM | POA: Diagnosis not present

## 2021-05-08 DIAGNOSIS — N1831 Chronic kidney disease, stage 3a: Secondary | ICD-10-CM | POA: Diagnosis not present

## 2021-05-08 DIAGNOSIS — I359 Nonrheumatic aortic valve disorder, unspecified: Secondary | ICD-10-CM | POA: Diagnosis not present

## 2021-05-08 DIAGNOSIS — Z950 Presence of cardiac pacemaker: Secondary | ICD-10-CM | POA: Diagnosis not present

## 2021-05-08 DIAGNOSIS — N400 Enlarged prostate without lower urinary tract symptoms: Secondary | ICD-10-CM | POA: Diagnosis not present

## 2021-05-08 DIAGNOSIS — I5022 Chronic systolic (congestive) heart failure: Secondary | ICD-10-CM | POA: Diagnosis not present

## 2021-05-08 DIAGNOSIS — B0229 Other postherpetic nervous system involvement: Secondary | ICD-10-CM | POA: Diagnosis not present

## 2021-05-08 DIAGNOSIS — I429 Cardiomyopathy, unspecified: Secondary | ICD-10-CM | POA: Diagnosis not present

## 2021-05-09 ENCOUNTER — Telehealth: Payer: Self-pay

## 2021-05-09 ENCOUNTER — Ambulatory Visit (INDEPENDENT_AMBULATORY_CARE_PROVIDER_SITE_OTHER): Payer: Medicare HMO

## 2021-05-09 DIAGNOSIS — I5022 Chronic systolic (congestive) heart failure: Secondary | ICD-10-CM

## 2021-05-09 LAB — CUP PACEART REMOTE DEVICE CHECK
Battery Remaining Longevity: 146 mo
Battery Voltage: 3.13 V
Brady Statistic AP VP Percent: 18.46 %
Brady Statistic AP VS Percent: 80.25 %
Brady Statistic AS VP Percent: 0.23 %
Brady Statistic AS VS Percent: 1.07 %
Brady Statistic RA Percent Paced: 99.08 %
Brady Statistic RV Percent Paced: 18.69 %
Date Time Interrogation Session: 20230508222843
Implantable Lead Implant Date: 20090316
Implantable Lead Implant Date: 20090316
Implantable Lead Location: 753859
Implantable Lead Location: 753860
Implantable Lead Model: 5076
Implantable Lead Model: 5076
Implantable Pulse Generator Implant Date: 20221104
Lead Channel Impedance Value: 323 Ohm
Lead Channel Impedance Value: 380 Ohm
Lead Channel Impedance Value: 418 Ohm
Lead Channel Impedance Value: 494 Ohm
Lead Channel Pacing Threshold Amplitude: 0.625 V
Lead Channel Pacing Threshold Amplitude: 0.75 V
Lead Channel Pacing Threshold Pulse Width: 0.4 ms
Lead Channel Pacing Threshold Pulse Width: 0.4 ms
Lead Channel Sensing Intrinsic Amplitude: 1.125 mV
Lead Channel Sensing Intrinsic Amplitude: 1.125 mV
Lead Channel Sensing Intrinsic Amplitude: 7.5 mV
Lead Channel Sensing Intrinsic Amplitude: 7.5 mV
Lead Channel Setting Pacing Amplitude: 1.5 V
Lead Channel Setting Pacing Amplitude: 2 V
Lead Channel Setting Pacing Pulse Width: 0.4 ms
Lead Channel Setting Sensing Sensitivity: 4 mV

## 2021-05-09 NOTE — Telephone Encounter (Signed)
Scheduled remote reviewed. Normal device function.   ?Presenting rhythm ApVs (MVP mode) with intermittent RV undersensing due to varied RV sensed morphology and sensitivity programmed to 4.82m. Routing for further review.  ? ?Outreach made to Pt.  He states he is feeling very well since his last office visit with Dr. KCaryl Comeswhen his amiodarone was reduced from 1200 mg a week to 1000 mg a week.  He states he has not felt this well in years.  He states "you guys are doing a great job".   ? ?Reviewed reading with Dr. TLovena Le  Per Dr. TEstanislado PandyPt is asymptomatic no changes required and make note to follow up at next office visit. ? ?No action needed. ? ? ? ? ?

## 2021-05-10 DIAGNOSIS — N1831 Chronic kidney disease, stage 3a: Secondary | ICD-10-CM | POA: Diagnosis not present

## 2021-05-10 DIAGNOSIS — I129 Hypertensive chronic kidney disease with stage 1 through stage 4 chronic kidney disease, or unspecified chronic kidney disease: Secondary | ICD-10-CM | POA: Diagnosis not present

## 2021-05-10 DIAGNOSIS — Z950 Presence of cardiac pacemaker: Secondary | ICD-10-CM | POA: Diagnosis not present

## 2021-05-10 DIAGNOSIS — I429 Cardiomyopathy, unspecified: Secondary | ICD-10-CM | POA: Diagnosis not present

## 2021-05-10 DIAGNOSIS — E785 Hyperlipidemia, unspecified: Secondary | ICD-10-CM | POA: Diagnosis not present

## 2021-05-10 DIAGNOSIS — I351 Nonrheumatic aortic (valve) insufficiency: Secondary | ICD-10-CM | POA: Diagnosis not present

## 2021-05-10 DIAGNOSIS — Z79899 Other long term (current) drug therapy: Secondary | ICD-10-CM | POA: Diagnosis not present

## 2021-05-10 DIAGNOSIS — R768 Other specified abnormal immunological findings in serum: Secondary | ICD-10-CM | POA: Diagnosis not present

## 2021-05-10 DIAGNOSIS — I7121 Aneurysm of the ascending aorta, without rupture: Secondary | ICD-10-CM | POA: Diagnosis not present

## 2021-05-22 NOTE — Progress Notes (Signed)
Remote pacemaker transmission.   

## 2021-06-01 ENCOUNTER — Encounter: Payer: Self-pay | Admitting: Internal Medicine

## 2021-06-11 ENCOUNTER — Other Ambulatory Visit: Payer: Self-pay | Admitting: Internal Medicine

## 2021-07-10 ENCOUNTER — Encounter: Payer: Self-pay | Admitting: Internal Medicine

## 2021-07-10 ENCOUNTER — Other Ambulatory Visit: Payer: Self-pay | Admitting: Gastroenterology

## 2021-07-26 DIAGNOSIS — R972 Elevated prostate specific antigen [PSA]: Secondary | ICD-10-CM | POA: Diagnosis not present

## 2021-08-02 DIAGNOSIS — R3912 Poor urinary stream: Secondary | ICD-10-CM | POA: Diagnosis not present

## 2021-08-02 DIAGNOSIS — N401 Enlarged prostate with lower urinary tract symptoms: Secondary | ICD-10-CM | POA: Diagnosis not present

## 2021-08-02 DIAGNOSIS — R972 Elevated prostate specific antigen [PSA]: Secondary | ICD-10-CM | POA: Diagnosis not present

## 2021-08-08 ENCOUNTER — Ambulatory Visit (INDEPENDENT_AMBULATORY_CARE_PROVIDER_SITE_OTHER): Payer: Medicare HMO

## 2021-08-08 DIAGNOSIS — I428 Other cardiomyopathies: Secondary | ICD-10-CM

## 2021-08-08 LAB — CUP PACEART REMOTE DEVICE CHECK
Battery Remaining Longevity: 143 mo
Battery Voltage: 3.06 V
Brady Statistic AP VP Percent: 19.73 %
Brady Statistic AP VS Percent: 79.38 %
Brady Statistic AS VP Percent: 0.16 %
Brady Statistic AS VS Percent: 0.73 %
Brady Statistic RA Percent Paced: 99.46 %
Brady Statistic RV Percent Paced: 19.9 %
Date Time Interrogation Session: 20230808045014
Implantable Lead Implant Date: 20090316
Implantable Lead Implant Date: 20090316
Implantable Lead Location: 753859
Implantable Lead Location: 753860
Implantable Lead Model: 5076
Implantable Lead Model: 5076
Implantable Pulse Generator Implant Date: 20221104
Lead Channel Impedance Value: 323 Ohm
Lead Channel Impedance Value: 380 Ohm
Lead Channel Impedance Value: 399 Ohm
Lead Channel Impedance Value: 475 Ohm
Lead Channel Pacing Threshold Amplitude: 0.5 V
Lead Channel Pacing Threshold Amplitude: 0.875 V
Lead Channel Pacing Threshold Pulse Width: 0.4 ms
Lead Channel Pacing Threshold Pulse Width: 0.4 ms
Lead Channel Sensing Intrinsic Amplitude: 1.5 mV
Lead Channel Sensing Intrinsic Amplitude: 1.5 mV
Lead Channel Sensing Intrinsic Amplitude: 8.25 mV
Lead Channel Sensing Intrinsic Amplitude: 8.25 mV
Lead Channel Setting Pacing Amplitude: 1.5 V
Lead Channel Setting Pacing Amplitude: 2 V
Lead Channel Setting Pacing Pulse Width: 0.4 ms
Lead Channel Setting Sensing Sensitivity: 4 mV

## 2021-08-14 DIAGNOSIS — I5022 Chronic systolic (congestive) heart failure: Secondary | ICD-10-CM | POA: Diagnosis not present

## 2021-08-14 DIAGNOSIS — R81 Glycosuria: Secondary | ICD-10-CM | POA: Diagnosis not present

## 2021-09-07 ENCOUNTER — Other Ambulatory Visit: Payer: Self-pay | Admitting: Internal Medicine

## 2021-09-12 NOTE — Progress Notes (Signed)
Remote pacemaker transmission.   

## 2021-09-26 ENCOUNTER — Encounter (HOSPITAL_COMMUNITY): Payer: Self-pay | Admitting: Gastroenterology

## 2021-09-27 ENCOUNTER — Encounter (HOSPITAL_COMMUNITY): Payer: Self-pay | Admitting: Physician Assistant

## 2021-09-27 NOTE — Progress Notes (Signed)
Patrick Weaver   Bowel Prep reminder: has info from office will follow per instructions   For Anesthesia: PCP - [facility], [place], [place]  Chest x-ray - 07/28/15 EKG - 02/13/21 Stress Test - n/a  ECHO - 08/31/21 Cardiac Cath - 02/2016 Pacemaker/ICD device last checked:08/08/21 Spinal Cord Stimulator: n/a Sleep Study -  05/2019 CPAP - uses bipap  Activity level:indepedent   Anesthesia review: Hx cardiomyopathy, thoracic aortic aneursym (stable), CHF EF 30-35 % , PM medtronic

## 2021-09-28 ENCOUNTER — Telehealth: Payer: Self-pay

## 2021-09-28 DIAGNOSIS — Z03818 Encounter for observation for suspected exposure to other biological agents ruled out: Secondary | ICD-10-CM | POA: Diagnosis not present

## 2021-09-28 DIAGNOSIS — R052 Subacute cough: Secondary | ICD-10-CM | POA: Diagnosis not present

## 2021-09-28 DIAGNOSIS — R059 Cough, unspecified: Secondary | ICD-10-CM | POA: Diagnosis not present

## 2021-09-28 DIAGNOSIS — R0989 Other specified symptoms and signs involving the circulatory and respiratory systems: Secondary | ICD-10-CM | POA: Diagnosis not present

## 2021-09-28 NOTE — Telephone Encounter (Signed)
Spoke with patient who states that they may reschedule his procedure and he will call us back to let us know for sure when he is having it.

## 2021-09-28 NOTE — Telephone Encounter (Signed)
   Pre-operative Risk Assessment    Patient Name: Patrick Weaver  DOB: 04/20/1945 MRN: [id number]      Request for Surgical Clearance    Procedure:   COLONOSCOPY  Date of Surgery:  Clearance 10/03/21                                 Surgeon:  [facility] Surgeon's Group or Practice Name:  [facility]  Phone number:  [phone] Fax number:  [phone]   Type of Clearance Requested:   - Medical    Type of Anesthesia:   PROPOFOL   Additional requests/questions:    SignedJacinta Shoe   09/28/2021, 11:56 AM

## 2021-09-28 NOTE — Telephone Encounter (Signed)
   Name: Patrick Weaver  DOB: [birth year]  MRN: [id number]  Primary Cardiologist: Dorris Carnes, MD   Preoperative team, please contact this patient and set up a phone call appointment for further preoperative risk assessment. Please obtain consent and complete medication review. Thank you for your help.  I confirm that guidance regarding antiplatelet and oral anticoagulation therapy has been completed and, if necessary, noted below (none requested).    Lenna Sciara, NP 09/28/2021, 12:04 PM [place]

## 2021-09-29 ENCOUNTER — Telehealth: Payer: Self-pay

## 2021-09-29 ENCOUNTER — Ambulatory Visit: Payer: Medicare HMO | Attending: Nurse Practitioner | Admitting: Nurse Practitioner

## 2021-09-29 DIAGNOSIS — Z01810 Encounter for preprocedural cardiovascular examination: Secondary | ICD-10-CM

## 2021-09-29 NOTE — Telephone Encounter (Signed)
Patient's wife called stating he had chest x-ray done yesterday but they still haven't gotten the results back.  She is wondering if he can be seen by [place] for his clearance.

## 2021-09-29 NOTE — Telephone Encounter (Signed)
Spoke with patient who states that his PCP ordered a x-ray due to hearing something in his lungs and wanted to evaluate that before clearing him for pre-op. Patient confirmed that he is to have his procedure on 10/3 and would like to know how to proceed forward. Patient states that right now his is dealing with a chest cold and that's probably what his PCP heard. Patient would like to know even though they are waiting on results from the x-ray can he still proceed to get cleared cardiac wise.

## 2021-09-29 NOTE — Telephone Encounter (Signed)
  Patient Consent for Virtual Visit        Patrick Weaver has provided verbal consent on 09/29/2021 for a virtual visit (video or telephone).   CONSENT FOR VIRTUAL VISIT FOR:  Patrick Weaver  By participating in this virtual visit I agree to the following:  I hereby voluntarily request, consent and authorize [facility] and its employed or contracted physicians, physician assistants, nurse practitioners or other licensed health care professionals (the Practitioner), to provide me with telemedicine health care services (the "Services") as deemed necessary by the treating Practitioner. I acknowledge and consent to receive the Services by the Practitioner via telemedicine. I understand that the telemedicine visit will involve communicating with the Practitioner through live audiovisual communication technology and the disclosure of certain medical information by electronic transmission. I acknowledge that I have been given the opportunity to request an in-person assessment or other available alternative prior to the telemedicine visit and am voluntarily participating in the telemedicine visit.  I understand that I have the right to withhold or withdraw my consent to the use of telemedicine in the course of my care at any time, without affecting my right to future care or treatment, and that the Practitioner or I may terminate the telemedicine visit at any time. I understand that I have the right to inspect all information obtained and/or recorded in the course of the telemedicine visit and may receive copies of available information for a reasonable fee.  I understand that some of the potential risks of receiving the Services via telemedicine include:  Delay or interruption in medical evaluation due to technological equipment failure or disruption; Information transmitted may not be sufficient (e.g. poor resolution of images) to allow for appropriate medical decision making by the Practitioner;  and/or  In rare instances, security protocols could fail, causing a breach of personal health information.  Furthermore, I acknowledge that it is my responsibility to provide information about my medical history, conditions and care that is complete and accurate to the best of my ability. I acknowledge that Practitioner's advice, recommendations, and/or decision may be based on factors not within their control, such as incomplete or inaccurate data provided by me or distortions of diagnostic images or specimens that may result from electronic transmissions. I understand that the practice of medicine is not an exact science and that Practitioner makes no warranties or guarantees regarding treatment outcomes. I acknowledge that a copy of this consent can be made available to me via my patient portal ([facility]), or I can request a printed copy by calling the office of [facility].    I understand that my insurance will be billed for this visit.   I have read or had this consent read to me. I understand the contents of this consent, which adequately explains the benefits and risks of the Services being provided via telemedicine.  I have been provided ample opportunity to ask questions regarding this consent and the Services and have had my questions answered to my satisfaction. I give my informed consent for the services to be provided through the use of telemedicine in my medical care

## 2021-09-29 NOTE — Telephone Encounter (Signed)
Patient had a virtual visit with Diona Browner, NP today for pre-op clearance and she would like for patient to come in to the office for a visit due to having SOB.  Spoke with patient who is agreeable to see Dr. Gasper Sells on Monday, October 02, 2021 at 10:30. Patient thanked me for the call.

## 2021-09-29 NOTE — Progress Notes (Signed)
Virtual Visit via Telephone Note   Because of Patrick Weaver's co-morbid illnesses, he is at least at moderate risk for complications without adequate follow up.  This format is felt to be most appropriate for this patient at this time.  The patient did not have access to video technology/had technical difficulties with video requiring transitioning to audio format only (telephone).  All issues noted in this document were discussed and addressed.  No physical exam could be performed with this format.  Please refer to the patient's chart for his consent to telehealth for [facility].  Evaluation Performed:  Preoperative cardiovascular risk assessment _____________   Date:  09/29/2021   Patient ID:  Patrick Weaver, DOB [birth year], MRN [id number] Patient Location:  Home Provider location:   Office  Primary Care Provider:  Vernie Shanks, MD (Inactive) Primary Cardiologist:  Dorris Carnes, MD  Chief Complaint / Patient Profile   76 y.o. y/o male with a h/o sinus node dysfunction s/p PPM, NICM, chronic systolic heart failure, PVCs, aortic root dilation with central AI, hypertension, dyslipidemia, and CKD stage III who is pending colonoscopy with Dr. Alessandra Bevels of [facility] and presents today for telephonic preoperative cardiovascular risk assessment.  Past Medical History    Past Medical History:  Diagnosis Date   Aneurysm of thoracic aorta ([place]) 07/06/2008   CT 12/18: Ascending aorta 4.5 cm, aortic root 4.6 cm , approximately 4.3 cm  (05/04/19)          Bladder stone    BPH (benign prostatic hyperplasia)    Bradycardia    a.s/p MDT dual chamber pacemaker   Chronic systolic heart failure ([place]) 08/13/2015   Echo 10/18: Moderate LVH, EF 25, mild AI, aortic annulus dilated at 53 mm, sinotubular junction is 34 mm, proximal ascending aorta is 39 mm // Echo 12/17: Mild LVH, EF 25-30, diffuse HK, mild AI, mild MR, severe LAE, mild RAE, PASP 42 // Echo 7/17: EF 25-30, severe diffuse HK  (disproportionately severe HK of the inferolateral and inferior myocardium), mild to moderate AI, aortic root 44 mm, ascend   Dyslipidemia    failed niaspan   History of echocardiogram    Echo 09/26/2017: EF 25-30, normal wall motion, grade 2 diastolic dysfunction, mild AI, severe LAE, mildly reduced RVSF   HTN (hypertension)    NICM (nonischemic cardiomyopathy) (HCC)    LHC2/18: no angiographic CAD   PVCs (premature ventricular contractions)    controlled on Amiodarone // Holter 11/17: PVCs 1.8%   Sleep apnea    USES C-PAP   Past Surgical History:  Procedure Laterality Date   CYSTOSCOPY WITH LITHOLAPAXY N/A 07/12/2014   Procedure: CYSTOSCOPY WITH LITHOLAPAXY;  Surgeon: Raynelle Bring, MD;  Location: [facility];  Service: Urology;  Laterality: N/A;   EP IMPLANTABLE DEVICE  2009   LEFT HEART CATH AND CORONARY ANGIOGRAPHY N/A 02/15/2016   Procedure: Left Heart Cath and Coronary Angiography;  Surgeon: Burnell Blanks, MD;  Location: [facility];  Service: Cardiovascular;  Laterality: N/A;   PPM GENERATOR CHANGEOUT N/A 11/04/2020   Procedure: PPM GENERATOR CHANGEOUT;  Surgeon: Deboraha Sprang, MD;  Location: [facility];  Service: Cardiovascular;  Laterality: N/A;   TONSILLECTOMY     TRANSURETHRAL RESECTION OF PROSTATE N/A 07/12/2014   Procedure: TRANSURETHRAL RESECTION OF THE PROSTATE (TURP);  Surgeon: Raynelle Bring, MD;  Location: [facility];  Service: Urology;  Laterality: N/A;    Allergies  Allergies  Allergen Reactions   Codeine Nausea Only  History of Present Illness    Patrick Weaver is a 76 y.o. male who presents via audio/video conferencing for a telehealth visit today.  Pt was last seen in cardiology clinic on 02/13/2021 by Dr. Caryl Comes.  At that time Patrick Weaver was doing well.  The patient is now pending procedure as outlined above. Since his last visit, he reports progressive dyspnea at rest and with exertion.  He saw his PCP yesterday who ordered a chest x-ray,  results are pending.  He is short of breath talking on the phone.  He also notes that his oxygen saturation has reached as low as 88% on room air.  In this setting, I do not feel comfortable providing clearance for this patient for upcoming colonoscopy and would recommend in person evaluation with cardiology as well as close follow-up with his PCP.  We discussed ED precautions.He denies chest pain, palpitations, pnd, orthopnea, n, v, dizziness, syncope, edema, weight gain, or early satiety. All other systems reviewed and are otherwise negative except as noted above.   Home Medications    Prior to Admission medications   Medication Sig Start Date End Date Taking? Authorizing Provider  acetaminophen (TYLENOL) 325 MG tablet Take 2 tablets (650 mg total) by mouth every 6 (six) hours as needed for mild pain (or Fever >/= 101). 06/26/14   Robbie Lis, MD  amiodarone (PACERONE) 200 MG tablet TAKE ONE TABLET BY MOUTH ONCE DAILY EXCEPT SUNDAYS. Patient taking differently: Take 200 mg by mouth See admin instructions. Take Monday -Friday in the morning 04/25/21   Deboraha Sprang, MD  atorvastatin (LIPITOR) 10 MG tablet TAKE 1 TABLET BY MOUTH EVERY DAY Patient taking differently: Take 10 mg by mouth at bedtime. 04/03/21   Fay Records, MD  carvedilol (COREG) 6.25 MG tablet TAKE 1 TABLET BY MOUTH TWICE A DAY Patient taking differently: Take 6.25 mg by mouth 2 (two) times daily with a meal. 01/24/21   Fay Records, MD  empagliflozin (JARDIANCE) 10 MG TABS tablet Take 1 tablet (10 mg total) by mouth daily before breakfast. 02/15/21   Fay Records, MD  finasteride (PROSCAR) 5 MG tablet Take 5 mg by mouth at bedtime. 08/10/17   [provider]  fluticasone (FLONASE) 50 MCG/ACT nasal spray Place 1 spray into both nostrils daily as needed for rhinitis.    [provider]  furosemide (LASIX) 40 MG tablet TAKE 1 TABLET (40 MG TOTAL) BY MOUTH DAILY. TAKE POTASSIUM SAME DAYS YOU ADMINISTER YOUR  LASIX. Patient taking differently: Take 40 mg by mouth daily as needed for fluid. Take potassium same days you administer your lasix. 06/13/21   Deboraha Sprang, MD  gabapentin (NEURONTIN) 300 MG capsule Take 300 mg by mouth at bedtime. 01/01/13   [provider]  potassium chloride (KLOR-CON) 10 MEQ tablet TAKE 1 TABLET (10 MEQ TOTAL) BY MOUTH DAILY. TAKE THIS SAME DAYS YOU ADMINISTER YOUR EVERY OTHER DAY LASIX. Patient taking differently: Take 10 mEq by mouth See admin instructions. Take this same days you administer your every other day lasix. 04/25/21   Deboraha Sprang, MD  sacubitril-valsartan (ENTRESTO) 24-26 MG TAKE 1 TABLET BY MOUTH TWICE A DAY. 02/15/21   Fay Records, MD  spironolactone (ALDACTONE) 25 MG tablet Take 0.5 tablets (12.5 mg total) by mouth daily. Please call [phone] to schedule an overdue appointment for future refills. Thank you. 1st attempt. Patient taking differently: Take 12.5 mg by mouth at bedtime. Please call [phone] to schedule  an overdue appointment for future refills. Thank you. 1st attempt. 09/07/21   Deboraha Sprang, MD  tamsulosin (FLOMAX) 0.4 MG CAPS capsule Take 0.4 mg by mouth daily as needed (helps with  urination). 06/28/15   [provider]    Physical Exam    Vital Signs:  Patrick Weaver does not have vital signs available for review today.  Given telephonic nature of communication, physical exam is limited. AAOx3. NAD. Normal affect.  Speech and respirations are unlabored.  Accessory Clinical Findings    None  Assessment & Plan    1.  Preoperative Cardiovascular Risk Assessment:  Unable to provide clearance today for upcoming colonoscopy in the setting of worsening dyspnea at rest and with exertion.  Recommend in person evaluation.   I will ask our preop coverage team to schedule and office visit ASAP.   A copy of this note will be routed to requesting surgeon.   Time:   Today, I have spent 5 minutes with the patient  with telehealth technology discussing medical history, symptoms, and management plan.     Lenna Sciara, NP  09/29/2021, 11:38 AM

## 2021-09-29 NOTE — Telephone Encounter (Signed)
Spoke with patient who is agreeable to do a tele visit on 9/29 at 11:20 add in per Diona Browner, NP. Med rec and consent have been done.

## 2021-10-01 NOTE — Progress Notes (Unsigned)
Cardiology Office Note:    Date:  10/02/2021   ID:  Patrick Weaver, DOB 20-Jun-1945, MRN [id number]  PCP:  Vernie Shanks, MD (Inactive)   [facility] Cardiologist:  Dorris Carnes, MD Electrophysiologist:  Virl Axe, MD     Referring MD: No ref. provider found   CC: DOD worsening SOB  History of Present Illness:    Patrick Weaver is a 76 y.o. male with a hx of SSS, s/p MDT DC PPM, PVCs treated with amiodarone, HFrEF EF 25%, and mild to moderate AI who presents for DOD visit.  Was planned for colonoscopy but was having worsening SOB.  Patient notes that he is doing poorly.   In August was able to do most of what he wanted to do. There are no interval hospital/ED visit.   Was able to walk but wit moderate exertion had chest tightness and DOE.  Has a cold last week.  Has not been able to get better.  Has CXR showing atelectasis with no infection.   No chest pain or pressure. Worsening SOB. Worsening DOE. Worsening abdominal swelling. Kidneys are the best they have been.   Usually takes lasix once a week and loses three pounds.  Lately lasix has been less effective and he takes it twice a week. Notes weight gain.  No palpitations or syncope (has PVCs).   Past Medical History:  Diagnosis Date   Aneurysm of thoracic aorta ([place]) 07/06/2008   CT 12/18: Ascending aorta 4.5 cm, aortic root 4.6 cm , approximately 4.3 cm  (05/04/19)          Bladder stone    BPH (benign prostatic hyperplasia)    Bradycardia    a.s/p MDT dual chamber pacemaker   Chronic systolic heart failure ([place]) 08/13/2015   Echo 10/18: Moderate LVH, EF 25, mild AI, aortic annulus dilated at 53 mm, sinotubular junction is 34 mm, proximal ascending aorta is 39 mm // Echo 12/17: Mild LVH, EF 25-30, diffuse HK, mild AI, mild MR, severe LAE, mild RAE, PASP 42 // Echo 7/17: EF 25-30, severe diffuse HK (disproportionately severe HK of the inferolateral and inferior myocardium), mild to moderate AI,  aortic root 44 mm, ascend   Dyslipidemia    failed niaspan   History of echocardiogram    Echo 09/26/2017: EF 25-30, normal wall motion, grade 2 diastolic dysfunction, mild AI, severe LAE, mildly reduced RVSF   HTN (hypertension)    NICM (nonischemic cardiomyopathy) (HCC)    LHC2/18: no angiographic CAD   PVCs (premature ventricular contractions)    controlled on Amiodarone // Holter 11/17: PVCs 1.8%   Sleep apnea    USES C-PAP    Past Surgical History:  Procedure Laterality Date   CYSTOSCOPY WITH LITHOLAPAXY N/A 07/12/2014   Procedure: CYSTOSCOPY WITH LITHOLAPAXY;  Surgeon: Raynelle Bring, MD;  Location: [facility];  Service: Urology;  Laterality: N/A;   EP IMPLANTABLE DEVICE  2009   LEFT HEART CATH AND CORONARY ANGIOGRAPHY N/A 02/15/2016   Procedure: Left Heart Cath and Coronary Angiography;  Surgeon: Burnell Blanks, MD;  Location: [facility];  Service: Cardiovascular;  Laterality: N/A;   PPM GENERATOR CHANGEOUT N/A 11/04/2020   Procedure: PPM GENERATOR CHANGEOUT;  Surgeon: Deboraha Sprang, MD;  Location: [facility];  Service: Cardiovascular;  Laterality: N/A;   TONSILLECTOMY     TRANSURETHRAL RESECTION OF PROSTATE N/A 07/12/2014   Procedure: TRANSURETHRAL RESECTION OF THE PROSTATE (TURP);  Surgeon: Raynelle Bring, MD;  Location:  [facility];  Service: Urology;  Laterality: N/A;    Current Medications: Current Meds  Medication Sig   acetaminophen (TYLENOL) 325 MG tablet Take 2 tablets (650 mg total) by mouth every 6 (six) hours as needed for mild pain (or Fever >/= 101).   atorvastatin (LIPITOR) 10 MG tablet TAKE 1 TABLET BY MOUTH EVERY DAY   carvedilol (COREG) 6.25 MG tablet TAKE 1 TABLET BY MOUTH TWICE A DAY   empagliflozin (JARDIANCE) 10 MG TABS tablet Take 1 tablet (10 mg total) by mouth daily before breakfast.   finasteride (PROSCAR) 5 MG tablet Take 5 mg by mouth at bedtime.   fluticasone (FLONASE) 50 MCG/ACT nasal spray Place 1 spray into both nostrils daily as  needed for rhinitis.   furosemide (LASIX) 40 MG tablet Take 1 tablet (40 mg total) by mouth daily.   gabapentin (NEURONTIN) 300 MG capsule Take 300 mg by mouth at bedtime.   potassium chloride (KLOR-CON) 10 MEQ tablet Take 1 tablet (10 mEq total) by mouth daily.   sacubitril-valsartan (ENTRESTO) 24-26 MG TAKE 1 TABLET BY MOUTH TWICE A DAY.   spironolactone (ALDACTONE) 25 MG tablet Take 0.5 tablets (12.5 mg total) by mouth daily. Please call [phone] to schedule an overdue appointment for future refills. Thank you. 1st attempt.   tamsulosin (FLOMAX) 0.4 MG CAPS capsule Take 0.4 mg by mouth daily as needed (helps with  urination).   [DISCONTINUED] amiodarone (PACERONE) 200 MG tablet TAKE ONE TABLET BY MOUTH ONCE DAILY EXCEPT SUNDAYS. (Patient taking differently: Take 200 mg by mouth See admin instructions. Take Monday -Friday in the morning)   [DISCONTINUED] furosemide (LASIX) 40 MG tablet TAKE 1 TABLET (40 MG TOTAL) BY MOUTH DAILY. TAKE POTASSIUM SAME DAYS YOU ADMINISTER YOUR LASIX. (Patient taking differently: Take 40 mg by mouth 2 (two) times a week. Take potassium same days you administer your lasix.)   [DISCONTINUED] potassium chloride (KLOR-CON) 10 MEQ tablet TAKE 1 TABLET (10 MEQ TOTAL) BY MOUTH DAILY. TAKE THIS SAME DAYS YOU ADMINISTER YOUR EVERY OTHER DAY LASIX.     Allergies:   Codeine   Social History   Socioeconomic History   Marital status: Married    Spouse name: Not on file   Number of children: 3   Years of education: Not on file   Highest education level: Not on file  Occupational History   Not on file  Tobacco Use   Smoking status: Never   Smokeless tobacco: Never  Vaping Use   Vaping Use: Never used  Substance and Sexual Activity   Alcohol use: No   Drug use: No   Sexual activity: Not on file  Other Topics Concern   Not on file  Social History Narrative   Married, 3 daughters. 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 Drinks 3 caffeinated beverages/day.     Social Determinants of Health   Financial Resource Strain: Not on file  Food Insecurity: Not on file  Transportation Needs: Not on file  Physical Activity: Not on file  Stress: Not on file  Social Connections: Not on file     Family History: The patient's family history includes Cancer in his mother; Colon cancer in his maternal grandfather; Congestive Heart Failure in his father; Heart attack in his father; Heart disease in his father; Hypertension in his brother and mother. There is no history of Stomach cancer or Esophageal cancer.  ROS:   Please see the history of present illness.     All other systems reviewed and  are negative.  EKGs/Labs/Other Studies Reviewed:    The following studies were reviewed today:   EKG:  EKG is  ordered today.  The ekg ordered today demonstrates  10/02/21: AP-VP with PVCs  LEFT HEART CATH AND CORONARY ANGIOGRAPHY 02/15/2016  Narrative 1. No angiographic evidence of CAD 2. Non-ischemic cardiomyopathy  Recommendation: Medical management of non-ischemic cardiomyopathy    ECHO COMPLETE WO IMAGING ENHANCING AGENT 08/31/2020  Narrative ECHOCARDIOGRAM REPORT    Patient Name:   Patrick Weaver Date of Exam: 08/31/2020 Medical Rec #:  [id number]      Height:       72.0 in Accession #:    [id number]     Weight:       223.0 lb Date of Birth:  25-Feb-1945     BSA:          2.231 m Patient Age:    76 years       BP:           100/62 mmHg Patient Gender: M              HR:           57 bpm. Exam Location:  [facility]  Procedure: 2D Echo, Cardiac Doppler and Color Doppler  Indications:    I50.9* Heart failure (unspecified)  History:        Patient has prior history of Echocardiogram examinations, most recent 10/22/2019. Pacemaker, Arrythmias:PVC; Risk Factors:Hypertension and Dyslipidemia. NICM. Aortic valve disorder. Aneurysm of the thoracic aorta. Sleep apnea. Dyspnea on exertion. Chronotropic incompetence with sinus  node dysfunction.  Sonographer:    Diamond Nickel RCS Referring Phys: [place]   1. Global hypokinesis worse in the inferior myocardium. Left ventricular ejection fraction, by estimation, is 30 to 35%. The left ventricle has moderately decreased function. The left ventricle demonstrates global hypokinesis. Left ventricular diastolic parameters are consistent with Grade I diastolic dysfunction (impaired relaxation). 2. Right ventricular systolic function is normal. The right ventricular size is normal. 3. Left atrial size was severely dilated. 4. A small pericardial effusion is present. The pericardial effusion is anterior to the right ventricle. 5. The mitral valve is normal in structure. Trivial mitral valve regurgitation. No evidence of mitral stenosis. 6. The aortic valve is tricuspid. Aortic valve regurgitation is mild. No aortic stenosis is present. 7. Aortic dilatation noted. There is mild dilatation of the aortic root, measuring 39 mm. There is mild dilatation of the ascending aorta, measuring 41 mm. 8. The inferior vena cava is normal in size with greater than 50% respiratory variability, suggesting right atrial pressure of 3 mmHg.  FINDINGS Left Ventricle: Global hypokinesis worse in the inferior myocardium. Left ventricular ejection fraction, by estimation, is 30 to 35%. The left ventricle has moderately decreased function. The left ventricle demonstrates global hypokinesis. The left ventricular internal cavity size was normal in size. There is no left ventricular hypertrophy. Left ventricular diastolic parameters are consistent with Grade I diastolic dysfunction (impaired relaxation).  Right Ventricle: The right ventricular size is normal. No increase in right ventricular wall thickness. Right ventricular systolic function is normal.  Left Atrium: Left atrial size was severely dilated.  Right Atrium: Right atrial size was normal in size.  Pericardium: A  small pericardial effusion is present. The pericardial effusion is anterior to the right ventricle.  Mitral Valve: The mitral valve is normal in structure. Trivial mitral valve regurgitation. No evidence of mitral valve stenosis.  Tricuspid Valve: The tricuspid valve is  normal in structure. Tricuspid valve regurgitation is mild . No evidence of tricuspid stenosis.  Aortic Valve: The aortic valve is tricuspid. Aortic valve regurgitation is mild. No aortic stenosis is present.  Pulmonic Valve: The pulmonic valve was normal in structure. Pulmonic valve regurgitation is mild. No evidence of pulmonic stenosis.  Aorta: Aortic dilatation noted. There is mild dilatation of the aortic root, measuring 39 mm. There is mild dilatation of the ascending aorta, measuring 41 mm.  Venous: The inferior vena cava is normal in size with greater than 50% respiratory variability, suggesting right atrial pressure of 3 mmHg.  IAS/Shunts: No atrial level shunt detected by color flow Doppler.  Additional Comments: A device lead is visualized.   LEFT VENTRICLE PLAX 2D LVIDd:         5.40 cm LVIDs:         4.30 cm LV PW:         1.00 cm LV IVS:        1.00 cm LVOT diam:     2.10 cm LV SV:         52 LV SV Index:   23 LVOT Area:     3.46 cm   RIGHT VENTRICLE RV Basal diam:  2.50 cm RV S prime:     9.96 cm/s TAPSE (M-mode): 2.2 cm RVSP:           24.3 mmHg  LEFT ATRIUM              Index       RIGHT ATRIUM           Index LA diam:        4.70 cm  2.11 cm/m  RA Pressure: 3.00 mmHg LA Vol (A2C):   99.0 ml  44.37 ml/m RA Area:     14.50 cm LA Vol (A4C):   104.0 ml 46.61 ml/m RA Volume:   35.60 ml  15.95 ml/m LA Biplane Vol: 111.0 ml 49.74 ml/m AORTIC VALVE LVOT Vmax:   64.60 cm/s LVOT Vmean:  43.750 cm/s LVOT VTI:    0.149 m  AORTA Ao Root diam: 3.90 cm  MITRAL VALVE                TRICUSPID VALVE MV Area (PHT): 3.48 cm     TR Peak grad:   21.3 mmHg MV Decel Time: 218 msec     TR Vmax:         231.00 cm/s MV E velocity: 54.00 cm/s   Estimated RAP:  3.00 mmHg MV A velocity: 112.00 cm/s  RVSP:           24.3 mmHg MV E/A ratio:  0.48 SHUNTS Systemic VTI:  0.15 m Systemic Diam: 2.10 cm  Skeet Latch MD Electronically signed by Skeet Latch MD Signature Date/Time: 08/31/2020/2:59:52 PM    Final   ECHO LIMITED WO IMAGE ENHANCING AGENT W COLOR & SPECT 12/02/2015  Narrative [facility] [address], [place] [address] [phone]  ------------------------------------------------------------------- Transthoracic Echocardiography  Patient:    Patrick Weaver, Patrick Weaver MR #:       [id number] Study Date: 12/02/2015 Gender:     M Age:        61 Height:     182.9 cm Weight:     95.3 kg BSA:        2.22 m^2 Pt. Status: Room:  ATTENDING    Dorris Carnes, M.D. ORDERING     Dorris Carnes, M.D. REFERRING  Dorris Carnes, M.D. SONOGRAPHER  Wyatt Mage, RDCS PERFORMING   [facility], Outpatient  cc:  ------------------------------------------------------------------- LV EF: 25% -   30%  ------------------------------------------------------------------- Indications:      LV dysfunction (I51.9). LIMITED for LV function.  ------------------------------------------------------------------- History:   PMH:   Dyspnea.  Congestive heart failure.  Aortic valve disease.  Risk factors:  PVC. Thoracic aortic aneurysm. Sleep apena. Hypertension. Dyslipidemia.  ------------------------------------------------------------------- Study Conclusions  - Left ventricle: The cavity size was severely dilated. Wall thickness was increased in a pattern of mild LVH. Systolic function was severely reduced. The estimated ejection fraction was in the range of 25% to 30%. Diffuse hypokinesis. Left ventricular diastolic function parameters were normal. - Aortic valve: There was mild regurgitation. - Mitral valve: There was mild regurgitation. - Left atrium: The atrium was severely  dilated. - Right ventricle: The cavity size was mildly dilated. - Right atrium: The atrium was mildly dilated. - Atrial septum: A patent foramen ovale cannot be excluded. - Pulmonary arteries: PA peak pressure: 42 mm Hg (S).  ------------------------------------------------------------------- Labs, prior tests, procedures, and surgery: Transthoracic echocardiography (07/22/2015).     EF was 30% and PA pressure was 36 (systolic).  Permanent pacemaker system implantation.  ------------------------------------------------------------------- Study data:  Comparison was made to the study of 07/22/2015. Procedure:  The patient reported no pain pre or post test. Transthoracic echocardiography. Image quality was adequate.  Study completion:  There were no complications.          Transthoracic echocardiography.  M-mode, limited 2D, limited spectral Doppler, and color Doppler.  Birthdate:  Patient birthdate: 12-Jul-1945. Age:  Patient is 76 yr old.  Sex:  Gender: male.    BMI: 28.5 kg/m^2.  Blood pressure:     122/74  Patient status:  Outpatient. Study date:  Study date: 12/02/2015. Study time: 07:52 AM. Location:  [facility]  -------------------------------------------------------------------  ------------------------------------------------------------------- Left ventricle:  The cavity size was severely dilated. Wall thickness was increased in a pattern of mild LVH. Systolic function was severely reduced. The estimated ejection fraction was in the range of 25% to 30%. Diffuse hypokinesis. The transmitral flow pattern was normal. The deceleration time of the early transmitral flow velocity was normal. The pulmonary vein flow pattern was normal. The tissue Doppler parameters were normal. Left ventricular diastolic function parameters were normal.  ------------------------------------------------------------------- Aortic valve:   Mildly thickened leaflets.  Doppler:  There  was mild regurgitation.  ------------------------------------------------------------------- Aorta:  The aorta was normal, not dilated, and non-diseased.  ------------------------------------------------------------------- Mitral valve:   Doppler:  There was mild regurgitation.  ------------------------------------------------------------------- Left atrium:  The atrium was severely dilated.  ------------------------------------------------------------------- Atrial septum:  A patent foramen ovale cannot be excluded.  ------------------------------------------------------------------- Right ventricle:  The cavity size was mildly dilated. Pacer wire or catheter noted in right ventricle.  ------------------------------------------------------------------- Pulmonic valve:    Doppler:  There was mild regurgitation.  ------------------------------------------------------------------- Tricuspid valve:   Doppler:  There was mild regurgitation.  ------------------------------------------------------------------- Right atrium:  The atrium was mildly dilated. Pacer wire or catheter noted in right atrium.  ------------------------------------------------------------------- Pericardium:  The pericardium was normal in appearance.  ------------------------------------------------------------------- Systemic veins: Inferior vena cava: The vessel was normal in size. The respirophasic diameter changes were in the normal range (>= 50%), consistent with normal central venous pressure.  ------------------------------------------------------------------- Post procedure conclusions Ascending Aorta:  - The aorta was normal, not dilated, and non-diseased.  ------------------------------------------------------------------- Measurements  Left ventricle  Value        Reference LV ID, ED, PLAX chordal          (H)     60.6  mm     43 - 52 LV ID, ES, PLAX chordal           (H)     53.3  mm     23 - 38 LV fx shortening, PLAX chordal   (L)     12    %      >=29 LV PW thickness, ED                      9.68  mm     --------- IVS/LV PW ratio, ED                      1.24         <=1.3 Stroke volume, 2D                        75    ml     --------- Stroke volume/bsa, 2D                    34    ml/m^2 --------- LV e&', lateral                           6.42  cm/s   --------- LV E/e&', lateral                         9.6          --------- LV e&', medial                            4.46  cm/s   --------- LV E/e&', medial                          13.81        --------- LV e&', average                           5.44  cm/s   --------- LV E/e&', average                         11.32        ---------  Ventricular septum                       Value        Reference IVS thickness, ED                        12    mm     ---------  LVOT                                     Value        Reference LVOT ID, S                               25    mm     ---------  LVOT area                                4.91  cm^2   --------- LVOT peak velocity, S                    71.7  cm/s   --------- LVOT mean velocity, S                    49.2  cm/s   --------- LVOT VTI, S                              15.2  cm     ---------  Aortic valve                             Value        Reference Aortic regurg pressure half-time         515   ms     ---------  Aorta                                    Value        Reference Aortic root ID, ED                       40    mm     ---------  Left atrium                              Value        Reference LA ID, A-P, ES                           56    mm     --------- LA ID/bsa, A-P                   (H)     2.52  cm/m^2 <=2.2  Mitral valve                             Value        Reference Mitral E-wave peak velocity              61.6  cm/s   --------- Mitral A-wave peak velocity              50    cm/s   --------- Mitral deceleration time                  169   ms     150 - 230 Mitral E/A ratio, peak                   1.2          ---------  Pulmonary arteries                       Value        Reference PA pressure, S, DP               (H)  42    mm Hg  <=30  Tricuspid valve                          Value        Reference Tricuspid regurg peak velocity           290   cm/s   --------- Tricuspid peak RV-RA gradient            34    mm Hg  ---------  Systemic veins                           Value        Reference Estimated CVP                            8     mm Hg  ---------  Right ventricle                          Value        Reference TAPSE                                    14.2  mm     --------- RV pressure, S, DP               (H)     42    mm Hg  <=30 RV s&', lateral, S                        10.6  cm/s   ---------  Legend: (L)  and  (H)  mark values outside specified reference range.  ------------------------------------------------------------------- Prepared and Electronically Authenticated by  Jenkins Rouge, M.D. 2017-12-01T08:27:08      LONG TERM MONITOR (3-7 DAYS) INTERPRETATION 10/16/2019  Narrative Sinus rhythm   Rates69 to 102 bpm  Average HR 75 bpm Occasional PVC   6.5 % total    Recent Labs: 10/18/2020: NT-Pro BNP 884 10/26/2020: Hemoglobin 14.7; Platelets 143 11/16/2020: BUN 23; Creatinine, Ser 1.62; Potassium 4.6; Sodium 141 02/13/2021: ALT 20; TSH 3.340  Recent Lipid Panel    Component Value Date/Time   CHOL 122 10/02/2019 1650   TRIG 180 (H) 10/02/2019 1650   HDL 29 (L) 10/02/2019 1650   CHOLHDL 4.2 10/02/2019 1650   CHOLHDL 3.7 10/21/2015 0941   VLDL 25 10/21/2015 0941   LDLCALC 63 10/02/2019 1650   LDLDIRECT 98.6 05/30/2006 0856    Physical Exam:    VS:  BP 100/68   Pulse 76   Ht 6' (1.829 m)   Wt 219 lb (99.3 kg)   SpO2 92%   BMI 29.70 kg/m     Wt Readings from Last 3 Encounters:  10/02/21 219 lb (99.3 kg)  02/13/21 219 lb (99.3 kg)  12/15/20 220 lb 6.4 oz (100  kg)    GEN: Mild acute distress HEENT: Normal LYMPHATICS: No lymphadenopathy CARDIAC: RRR, diastolic murmurs no rubs, gallops RESPIRATORY:  Crackles in the bases ABDOMEN: Non-tenderly distended with fluid wave MUSCULOSKELETAL:  No edema; No deformity  SKIN: Warm and dry NEUROLOGIC:  Alert and oriented x 3 PSYCHIATRIC:  Normal affect   ASSESSMENT:    1. Chronic systolic heart failure (HCC)   2. Stage 3b chronic kidney  disease ([place])   3. SOB (shortness of breath)   4. Chronotropic incompetence with sinus node dysfunction (HCC)   5. Aortic valve disorder   6. PVC's (premature ventricular contractions)   7. Frequent PVCs   8. Aneurysm of ascending aorta without rupture ([place])   9. Non-ischemic cardiomyopathy ([place])   10. Left ventricular systolic dysfunction    PLAN:    Heart Failure Reduced Ejection Fraction  PVCs CKD Mild to moderate AI with TAA SOB - NYHA class IV, Stage C-D, hypervolemic, etiology unclear - Diuretic regimen: will increase to lasix 40 mg PO daily and take his 10 meq K; BMP/BNP today and within the week on new therapy (he is very concerned we will make his kidney function worse) - recent viral infection, will get CBC, will defer ABX to PCP - will repeat echo - unable to titrate BB further, may be able to increase MRA based on follow up labs, on SGLT2i, unable to titrate ARNI higher (BP) - continue amiodarone - I have a low threshold for him to go to ED if worsening SOB - has Cardiology f/u in Early November  Defer screening colonoscopy until sx have resolved   Medication Adjustments/Labs and Tests Ordered: Current medicines are reviewed at length with the patient today.  Concerns regarding medicines are outlined above.  Orders Placed This Encounter  Procedures   Pro b natriuretic peptide (BNP)   Basic metabolic panel   Basic metabolic panel   Pro b natriuretic peptide (BNP)   CBC   EKG 12-Lead   ECHOCARDIOGRAM COMPLETE   Meds ordered this  encounter  Medications   furosemide (LASIX) 40 MG tablet    Sig: Take 1 tablet (40 mg total) by mouth daily.    Dispense:  90 tablet    Refill:  3   potassium chloride (KLOR-CON) 10 MEQ tablet    Sig: Take 1 tablet (10 mEq total) by mouth daily.    Dispense:  90 tablet    Refill:  3    Patient Instructions  Medication Instructions:  START: furosemide (Lasix) 40 mg by mouth once daily START: Potassium Chloride 10 mEq by mouth once daily *If you need a refill on your cardiac medications before your next appointment, please call your pharmacy*   Lab Work: TODAY: BNP, BMP, CBC  IN 1 WEEK: BMP, BNP  If you have labs (blood work) drawn today and your tests are completely normal, you will receive your results only by: [place] (if you have MyChart) OR A paper copy in the mail If you have any lab test that is abnormal or we need to change your treatment, we will call you to review the results.   Testing/Procedures: Your physician has requested that you have an echocardiogram. Echocardiography is a painless test that uses sound waves to create images of your heart. It provides your doctor with information about the size and shape of your heart and how well your heart's chambers and valves are working. This procedure takes approximately one hour. There are no restrictions for this procedure.    Follow-Up:As scheduled At [facility] you and your health needs are our priority.  As part of our continuing mission to provide you with exceptional heart care, we have created designated Provider Care Teams.  These Care Teams include your primary Cardiologist (physician) and Advanced Practice Providers (APPs -  Physician Assistants and Nurse Practitioners) who all work together to provide you with the care you need, when you need it.  Important Information About Sugar         Signed, Werner Lean, MD  10/02/2021 11:25 AM    [place]

## 2021-10-02 ENCOUNTER — Ambulatory Visit: Payer: Medicare HMO | Attending: Internal Medicine | Admitting: Internal Medicine

## 2021-10-02 ENCOUNTER — Encounter: Payer: Self-pay | Admitting: Internal Medicine

## 2021-10-02 VITALS — BP 100/68 | HR 76 | Ht 72.0 in | Wt 219.0 lb

## 2021-10-02 DIAGNOSIS — R0602 Shortness of breath: Secondary | ICD-10-CM | POA: Diagnosis not present

## 2021-10-02 DIAGNOSIS — I428 Other cardiomyopathies: Secondary | ICD-10-CM | POA: Diagnosis not present

## 2021-10-02 DIAGNOSIS — I519 Heart disease, unspecified: Secondary | ICD-10-CM

## 2021-10-02 DIAGNOSIS — I5022 Chronic systolic (congestive) heart failure: Secondary | ICD-10-CM

## 2021-10-02 DIAGNOSIS — I495 Sick sinus syndrome: Secondary | ICD-10-CM

## 2021-10-02 DIAGNOSIS — I359 Nonrheumatic aortic valve disorder, unspecified: Secondary | ICD-10-CM | POA: Diagnosis not present

## 2021-10-02 DIAGNOSIS — I5189 Other ill-defined heart diseases: Secondary | ICD-10-CM

## 2021-10-02 DIAGNOSIS — I7121 Aneurysm of the ascending aorta, without rupture: Secondary | ICD-10-CM

## 2021-10-02 DIAGNOSIS — N1832 Chronic kidney disease, stage 3b: Secondary | ICD-10-CM | POA: Diagnosis not present

## 2021-10-02 DIAGNOSIS — I493 Ventricular premature depolarization: Secondary | ICD-10-CM | POA: Diagnosis not present

## 2021-10-02 DIAGNOSIS — I498 Other specified cardiac arrhythmias: Secondary | ICD-10-CM

## 2021-10-02 MED ORDER — FUROSEMIDE 40 MG PO TABS: 40.0000 mg | ORAL_TABLET | Freq: Every day | ORAL | 3 refills | Status: DC

## 2021-10-02 MED ORDER — POTASSIUM CHLORIDE ER 10 MEQ PO TBCR: 10.0000 meq | EXTENDED_RELEASE_TABLET | Freq: Every day | ORAL | 3 refills | Status: DC

## 2021-10-02 NOTE — Patient Instructions (Signed)
Medication Instructions:  START: furosemide (Lasix) 40 mg by mouth once daily START: Potassium Chloride 10 mEq by mouth once daily *If you need a refill on your cardiac medications before your next appointment, please call your pharmacy*   Lab Work: TODAY: BNP, BMP, CBC  IN 1 WEEK: BMP, BNP  If you have labs (blood work) drawn today and your tests are completely normal, you will receive your results only by: [place] (if you have MyChart) OR A paper copy in the mail If you have any lab test that is abnormal or we need to change your treatment, we will call you to review the results.   Testing/Procedures: Your physician has requested that you have an echocardiogram. Echocardiography is a painless test that uses sound waves to create images of your heart. It provides your doctor with information about the size and shape of your heart and how well your heart's chambers and valves are working. This procedure takes approximately one hour. There are no restrictions for this procedure.    Follow-Up:As scheduled At [facility], you and your health needs are our priority.  As part of our continuing mission to provide you with exceptional heart care, we have created designated Provider Care Teams.  These Care Teams include your primary Cardiologist (physician) and Advanced Practice Providers (APPs -  Physician Assistants and Nurse Practitioners) who all work together to provide you with the care you need, when you need it.       Important Information About Sugar

## 2021-10-03 ENCOUNTER — Ambulatory Visit (HOSPITAL_COMMUNITY): Admission: RE | Admit: 2021-10-03 | Payer: Medicare HMO | Source: Home / Self Care | Admitting: Gastroenterology

## 2021-10-03 ENCOUNTER — Telehealth: Payer: Self-pay

## 2021-10-03 LAB — BASIC METABOLIC PANEL
BUN/Creatinine Ratio: 12 (ref 10–24)
BUN: 17 mg/dL (ref 8–27)
CO2: 23 mmol/L (ref 20–29)
Calcium: 9.5 mg/dL (ref 8.6–10.2)
Chloride: 105 mmol/L (ref 96–106)
Creatinine, Ser: 1.46 mg/dL — ABNORMAL HIGH (ref 0.76–1.27)
Glucose: 101 mg/dL — ABNORMAL HIGH (ref 70–99)
Potassium: 4.4 mmol/L (ref 3.5–5.2)
Sodium: 145 mmol/L — ABNORMAL HIGH (ref 134–144)
eGFR: 50 mL/min/{1.73_m2} — ABNORMAL LOW (ref >59)

## 2021-10-03 LAB — CBC
Hematocrit: 43.5 % (ref 37.5–51.0)
Hemoglobin: 15.2 g/dL (ref 13.0–17.7)
MCH: 32.5 pg (ref 26.6–33.0)
MCHC: 34.9 g/dL (ref 31.5–35.7)
MCV: 93 fL (ref 79–97)
Platelets: 152 10*3/uL (ref 150–450)
RBC: 4.67 x10E6/uL (ref 4.14–5.80)
RDW: 13.8 % (ref 11.6–15.4)
WBC: 6.4 10*3/uL (ref 3.4–10.8)

## 2021-10-03 LAB — PRO B NATRIURETIC PEPTIDE: NT-Pro BNP: 173 pg/mL (ref 0–376)

## 2021-10-03 SURGERY — COLONOSCOPY WITH PROPOFOL
Anesthesia: Monitor Anesthesia Care

## 2021-10-03 MED ORDER — FUROSEMIDE 40 MG PO TABS: 40.0000 mg | ORAL_TABLET | ORAL | 3 refills | Status: DC

## 2021-10-03 NOTE — Telephone Encounter (Signed)
The patient has been notified of the result and verbalized understanding.  All questions (if any) were answered. Barbette Mcglaun N Arnetta Odeh, RN 10/03/2021 5:47 PM   Pt takes furosemide 40 mg PO twice weekly based on weights.

## 2021-10-03 NOTE — Telephone Encounter (Signed)
-----   Message from Werner Lean, MD sent at 10/03/2021  9:48 AM EDT ----- Results: Improve Creatinine normal BNP Norrmal WBC count Plan: Repeat BMP only on lasix Go back to previous lasix on Thursday Results to PCP  Werner Lean, MD

## 2021-10-04 ENCOUNTER — Other Ambulatory Visit: Payer: Self-pay | Admitting: Internal Medicine

## 2021-10-09 ENCOUNTER — Ambulatory Visit: Payer: Medicare HMO | Attending: Internal Medicine

## 2021-10-09 DIAGNOSIS — N1832 Chronic kidney disease, stage 3b: Secondary | ICD-10-CM | POA: Diagnosis not present

## 2021-10-09 DIAGNOSIS — I5022 Chronic systolic (congestive) heart failure: Secondary | ICD-10-CM | POA: Diagnosis not present

## 2021-10-09 DIAGNOSIS — R0602 Shortness of breath: Secondary | ICD-10-CM | POA: Diagnosis not present

## 2021-10-09 LAB — BASIC METABOLIC PANEL
BUN/Creatinine Ratio: 9 — ABNORMAL LOW (ref 10–24)
BUN: 15 mg/dL (ref 8–27)
CO2: 22 mmol/L (ref 20–29)
Calcium: 9.8 mg/dL (ref 8.6–10.2)
Chloride: 103 mmol/L (ref 96–106)
Creatinine, Ser: 1.59 mg/dL — ABNORMAL HIGH (ref 0.76–1.27)
Glucose: 122 mg/dL — ABNORMAL HIGH (ref 70–99)
Potassium: 4 mmol/L (ref 3.5–5.2)
Sodium: 140 mmol/L (ref 134–144)
eGFR: 46 mL/min/{1.73_m2} — ABNORMAL LOW (ref >59)

## 2021-10-10 ENCOUNTER — Ambulatory Visit: Payer: Medicare HMO | Admitting: Internal Medicine

## 2021-10-11 ENCOUNTER — Encounter: Payer: Self-pay | Admitting: Internal Medicine

## 2021-10-11 ENCOUNTER — Ambulatory Visit: Payer: Medicare HMO | Admitting: Internal Medicine

## 2021-10-11 VITALS — BP 110/70 | HR 71 | Ht 72.0 in | Wt 220.2 lb

## 2021-10-11 DIAGNOSIS — R0602 Shortness of breath: Secondary | ICD-10-CM | POA: Diagnosis not present

## 2021-10-11 NOTE — Patient Instructions (Addendum)
I will call you with follow steps next.   Before your next visit I would like you to have:  Spirometry and DLCO - schedule next available 30 minutes. I will reach out to your heart doctors.  I still think this is the most likely explanation for your shortness of breath.  Based on the results of your breathing testing and echocardiogram we may recommend additional testing.  Your walk test today was normal

## 2021-10-11 NOTE — Progress Notes (Signed)
Patrick Weaver    [id number]    26-Mar-1945  Primary Care Physician:Wong, Edwyna Shell, MD (Inactive) Date of Appointment: 10/11/2021 Established Patient Visit  Chief complaint:   Chief Complaint  Patient presents with   Follow-up    SOB     HPI: Patrick Weaver is a 76 y.o. man with OSA, Heart failure redued ejection fraction EF 30%, atrial fibrillation s/p PPM on admiodarone  Interval Updates: Here for follow up due to worsening dyspnea.  Initially had improvement of symptoms after PPM generator change out and symptoms were felt secondary to chronotropic incompetence. Has been following with cardiology for his NYHA Class IV Stage CD heart failure.   Dyspnea has been gradually worsening since last December since they had covid. His wife is here with him and feels he has gotten progressively more short of breath. His oxygen saturations are usually in the mid 90s and have been dropping at home.   Has been seen by cardiology and not felt to be volume overloaded. BNP 173  I have reviewed the patient's family social and past medical history and updated as appropriate.   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/2008   CT 12/18: Ascending aorta 4.5 cm, aortic root 4.6 cm , approximately 4.3 cm  (05/04/19)          Bladder stone    BPH (benign prostatic hyperplasia)    Bradycardia    a.s/p MDT dual chamber pacemaker   Chronic systolic heart failure ([place]) 08/13/2015   Echo 10/18: Moderate LVH, EF 25, mild AI, aortic annulus dilated at 53 mm, sinotubular junction is 34 mm, proximal ascending aorta is 39 mm // Echo 12/17: Mild LVH, EF 25-30, diffuse HK, mild AI, mild MR, severe LAE, mild RAE, PASP 42 // Echo 7/17: EF 25-30, severe diffuse HK (disproportionately severe HK of the inferolateral and inferior myocardium), mild to moderate AI, aortic root 44 mm, ascend   Dyslipidemia    failed niaspan   History of echocardiogram    Echo 09/26/2017:  EF 25-30, normal wall motion, grade 2 diastolic dysfunction, mild AI, severe LAE, mildly reduced RVSF   HTN (hypertension)    NICM (nonischemic cardiomyopathy) (HCC)    LHC2/18: no angiographic CAD   PVCs (premature ventricular contractions)    controlled on Amiodarone // Holter 11/17: PVCs 1.8%   Sleep apnea    USES C-PAP    Past Surgical History:  Procedure Laterality Date   CYSTOSCOPY WITH LITHOLAPAXY N/A 07/12/2014   Procedure: CYSTOSCOPY WITH LITHOLAPAXY;  Surgeon: Raynelle Bring, MD;  Location: [facility];  Service: Urology;  Laterality: N/A;   EP IMPLANTABLE DEVICE  2009   LEFT HEART CATH AND CORONARY ANGIOGRAPHY N/A 02/15/2016   Procedure: Left Heart Cath and Coronary Angiography;  Surgeon: Burnell Blanks, MD;  Location: [place] [facility];  Service: Cardiovascular;  Laterality: N/A;   PPM GENERATOR CHANGEOUT N/A 11/04/2020   Procedure: PPM GENERATOR CHANGEOUT;  Surgeon: Deboraha Sprang, MD;  Location: [place] [facility];  Service: Cardiovascular;  Laterality: N/A;   TONSILLECTOMY     TRANSURETHRAL RESECTION OF PROSTATE N/A 07/12/2014   Procedure: TRANSURETHRAL RESECTION OF THE PROSTATE (TURP);  Surgeon: Raynelle Bring, MD;  Location: [facility];  Service: Urology;  Laterality: N/A;    Family History  Problem Relation Age of Onset   Hypertension Mother    Cancer Mother    Congestive Heart Failure Father  Heart disease Father    Heart attack Father    Colon cancer Maternal Grandfather    Hypertension Brother    Stomach cancer Neg Hx    Esophageal cancer Neg Hx     Social History   Occupational History   Not on file  Tobacco Use   Smoking status: Never   Smokeless tobacco: Never  Vaping Use   Vaping Use: Never used  Substance and Sexual Activity   Alcohol use: No   Drug use: No   Sexual activity: Not on file     Physical Exam: Blood pressure 110/70, pulse 71, height 6' (1.829 m), weight 220 lb 3.2 oz (99.9 kg), SpO2 94 %.  Gen:      fatigued ENT:  no nasal  polyps, mucus membranes moist Lungs:    ctab no wheezes or crackles CV:         Regular rate and rhythm; no murmurs, rubs, or gallops.  No pedal edema   Data Reviewed: Imaging: I have previously personally reviewed the CT Chest from June 2018 which shows mild right basilar atelectasis/postinfectious scarring   PFTs:     Latest Ref Rng & Units 08/22/2020   10:48 AM 02/04/2017    8:43 AM 11/01/2016    8:29 AM 05/31/2016    8:48 AM  PFT Results  FVC-Pre L 3.21  3.77  3.52  3.63   FVC-Predicted Pre % 70  78  79  81   FVC-Post L 3.28   3.39  3.94   FVC-Predicted Post % 72   76  88   Pre FEV1/FVC % % 76  80  83  79   Post FEV1/FCV % % 77   82  78   FEV1-Pre L 2.45  3.01  2.93  2.87   FEV1-Predicted Pre % 73  84  89  87   FEV1-Post L 2.54   2.78  3.07   DLCO uncorrected ml/min/mmHg 18.35  22.25  19.19  21.20   DLCO UNC% % 69  63  59  66   DLCO corrected ml/min/mmHg 18.35    20.97   DLCO COR %Predicted % 69    65   DLVA Predicted % 85  79  81  78   TLC L 5.87  6.51  6.26  6.50   TLC % Predicted % 81  87  89  93   RV % Predicted % 101  97  118  101    I have personally reviewed the patient's PFTs and shows normal pulmonary function.  Labs:  Lab Results  Component Value Date   WBC 6.4 10/02/2021   HGB 15.2 10/02/2021   HCT 43.5 10/02/2021   MCV 93 10/02/2021   PLT 152 10/02/2021   Lab Results  Component Value Date   NA 140 10/09/2021   K 4.0 10/09/2021   CL 103 10/09/2021   CO2 22 10/09/2021     Immunization status: Immunization History  Administered Date(s) Administered   Fluad Quad(high Dose 65+) 12/11/2019, 10/18/2020   Influenza, High Dose Seasonal PF 09/24/2017   Influenza,inj,Quad PF,6+ Mos 10/30/2013   PFIZER(Purple Top)SARS-COV-2 Vaccination 02/07/2019, 03/04/2019, 10/15/2019   Pfizer Covid-19 Vaccine Bivalent Booster 16yr & up 09/21/2020, 05/28/2021    Assessment:  Shortness of breath Chronic HF Sinus node dysfunction s/p PPM  Chronotropic  dependence OSA on bipap On amiodarone, requiring serial monitoring.   Plan/Recommendations: Will repeat pfts with spiro and dlco.   [facility]  mild bibasilar atelectasis. Lungs are clear on exam today. I would be surprised if this was a primary pulmonary issue but will obtain CT Chest if spiro/dlco are abnormal.   Will await echocardiogram. Will reach out to cardiology to inquire if CPET would be useful at this point if his echo is stable. Assuming his OSA is well controlled.   Ambulatory desaturation study performed today. No desaturation or hypoxemia.   I spent 30 minutes in the care of this patient today including pre-charting, chart review, review of results, face-to-face care, coordination of care and communication with consultants etc.).  Return to Care: I will contact him with next steps.   Lenice Llamas, MD Pulmonary and [place]

## 2021-10-17 ENCOUNTER — Ambulatory Visit (INDEPENDENT_AMBULATORY_CARE_PROVIDER_SITE_OTHER): Payer: Medicare HMO | Admitting: Internal Medicine

## 2021-10-17 DIAGNOSIS — R0602 Shortness of breath: Secondary | ICD-10-CM | POA: Diagnosis not present

## 2021-10-17 LAB — PULMONARY FUNCTION TEST
DL/VA % pred: 88 %
DL/VA: 3.53 ml/min/mmHg/L
DLCO cor % pred: 66 %
DLCO cor: 17.36 ml/min/mmHg
DLCO unc % pred: 66 %
DLCO unc: 17.36 ml/min/mmHg
FEF 25-75 Pre: 2.23 L/sec
FEF2575-%Pred-Pre: 92 %
FEV1-%Pred-Pre: 74 %
FEV1-Pre: 2.43 L
FEV1FVC-%Pred-Pre: 108 %
FEV6-%Pred-Pre: 72 %
FEV6-Pre: 3.08 L
FEV6FVC-%Pred-Pre: 106 %
FVC-%Pred-Pre: 68 %
FVC-Pre: 3.08 L
Pre FEV1/FVC ratio: 79 %
Pre FEV6/FVC Ratio: 100 %

## 2021-10-17 NOTE — Progress Notes (Signed)
Patrick Weaver and DLCO completed today

## 2021-10-21 ENCOUNTER — Other Ambulatory Visit: Payer: Self-pay | Admitting: Internal Medicine

## 2021-10-23 ENCOUNTER — Ambulatory Visit (HOSPITAL_COMMUNITY): Payer: Medicare HMO | Attending: Internal Medicine

## 2021-10-23 DIAGNOSIS — R0602 Shortness of breath: Secondary | ICD-10-CM

## 2021-10-23 DIAGNOSIS — N1832 Chronic kidney disease, stage 3b: Secondary | ICD-10-CM

## 2021-10-23 LAB — ECHOCARDIOGRAM COMPLETE
Area-P 1/2: 2.59 cm2
P 1/2 time: 470 msec
S' Lateral: 4.9 cm

## 2021-10-30 ENCOUNTER — Other Ambulatory Visit: Payer: Self-pay

## 2021-10-30 MED ORDER — ENTRESTO 24-26 MG PO TABS: ORAL_TABLET | ORAL | 3 refills | Status: DC

## 2021-10-31 DIAGNOSIS — I493 Ventricular premature depolarization: Secondary | ICD-10-CM | POA: Diagnosis not present

## 2021-10-31 DIAGNOSIS — I7121 Aneurysm of the ascending aorta, without rupture: Secondary | ICD-10-CM | POA: Diagnosis not present

## 2021-10-31 DIAGNOSIS — E785 Hyperlipidemia, unspecified: Secondary | ICD-10-CM | POA: Diagnosis not present

## 2021-10-31 DIAGNOSIS — I440 Atrioventricular block, first degree: Secondary | ICD-10-CM | POA: Insufficient documentation

## 2021-10-31 DIAGNOSIS — R768 Other specified abnormal immunological findings in serum: Secondary | ICD-10-CM | POA: Diagnosis not present

## 2021-10-31 DIAGNOSIS — N1831 Chronic kidney disease, stage 3a: Secondary | ICD-10-CM | POA: Diagnosis not present

## 2021-10-31 DIAGNOSIS — I429 Cardiomyopathy, unspecified: Secondary | ICD-10-CM | POA: Diagnosis not present

## 2021-10-31 DIAGNOSIS — Z950 Presence of cardiac pacemaker: Secondary | ICD-10-CM | POA: Diagnosis not present

## 2021-10-31 DIAGNOSIS — I351 Nonrheumatic aortic (valve) insufficiency: Secondary | ICD-10-CM | POA: Diagnosis not present

## 2021-10-31 DIAGNOSIS — I129 Hypertensive chronic kidney disease with stage 1 through stage 4 chronic kidney disease, or unspecified chronic kidney disease: Secondary | ICD-10-CM | POA: Diagnosis not present

## 2021-11-01 ENCOUNTER — Encounter: Payer: Self-pay | Admitting: Internal Medicine

## 2021-11-01 ENCOUNTER — Ambulatory Visit: Payer: Medicare HMO | Attending: Internal Medicine | Admitting: Internal Medicine

## 2021-11-01 ENCOUNTER — Ambulatory Visit (INDEPENDENT_AMBULATORY_CARE_PROVIDER_SITE_OTHER): Payer: Medicare HMO

## 2021-11-01 VITALS — BP 110/68 | HR 86 | Ht 72.0 in | Wt 219.2 lb

## 2021-11-01 DIAGNOSIS — Z79899 Other long term (current) drug therapy: Secondary | ICD-10-CM | POA: Diagnosis not present

## 2021-11-01 DIAGNOSIS — E039 Hypothyroidism, unspecified: Secondary | ICD-10-CM

## 2021-11-01 DIAGNOSIS — I493 Ventricular premature depolarization: Secondary | ICD-10-CM

## 2021-11-01 DIAGNOSIS — I428 Other cardiomyopathies: Secondary | ICD-10-CM | POA: Diagnosis not present

## 2021-11-01 DIAGNOSIS — I495 Sick sinus syndrome: Secondary | ICD-10-CM | POA: Diagnosis not present

## 2021-11-01 DIAGNOSIS — Z950 Presence of cardiac pacemaker: Secondary | ICD-10-CM | POA: Diagnosis not present

## 2021-11-01 DIAGNOSIS — I712 Thoracic aortic aneurysm, without rupture, unspecified: Secondary | ICD-10-CM

## 2021-11-01 DIAGNOSIS — I440 Atrioventricular block, first degree: Secondary | ICD-10-CM | POA: Diagnosis not present

## 2021-11-01 NOTE — Progress Notes (Unsigned)
Enrolled patient for a 3 day Zio XT monitor to be mailed to patients home  REDO 3 day ZIO XT monitor [id number] from office inventory applied.

## 2021-11-01 NOTE — Patient Instructions (Addendum)
Medication Instructions:  Your physician recommends that you continue on your current medications as directed. Please refer to the Current Medication list given to you today.  *If you need a refill on your cardiac medications before your next appointment, please call your pharmacy*   Lab Work: TSH and Liver Panel today If you have labs (blood work) drawn today and your tests are completely normal, you will receive your results only by: MyChart Message (if you have MyChart) OR A paper copy in the mail If you have any lab test that is abnormal or we need to change your treatment, we will call you to review the results.   Testing/Procedures: Non-Cardiac CT Angiography (CTA), is a special type of CT scan that uses a computer to produce multi-dimensional views of major blood vessels throughout the body. In CT angiography, a contrast material is injected through an IV to help visualize the blood vessels  ZIO XT- Long Term Monitor Instructions  Your physician has requested you wear a ZIO patch monitor for 3 days.  This is a single patch monitor. [employer] supplies one patch monitor per enrollment. Additional stickers are not available. Please do not apply patch if you will be having a Nuclear Stress Test,  Echocardiogram, Cardiac CT, MRI, or Chest Xray during the period you would be wearing the  monitor. The patch cannot be worn during these tests. You cannot remove and re-apply the  ZIO XT patch monitor.  Your ZIO patch monitor will be mailed 3 day USPS to your address on file. It may take 3-5 days  to receive your monitor after you have been enrolled.  Once you have received your monitor, please review the enclosed instructions. Your monitor  has already been registered assigning a specific monitor serial # to you.  Billing and Patient Assistance Program Information  We have supplied [employer] with any of your insurance information on file for billing purposes. [employer] offers a sliding scale  Patient Assistance Program for patients that do not have  insurance, or whose insurance does not completely cover the cost of the ZIO monitor.  You must apply for the Patient Assistance Program to qualify for this discounted rate.  To apply, please call [employer] at [phone], select option 4, select option 2, ask to apply for  Patient Assistance Program. Theodore Demark will ask your household income, and how many people  are in your household. They will quote your out-of-pocket cost based on that information.  [employer] will also be able to set up a 26-month interest-free payment plan if needed.  Applying the monitor   Shave hair from upper left chest.  Hold abrader disc by orange tab. Rub abrader in 40 strokes over the upper left chest as  indicated in your monitor instructions.  Clean area with 4 enclosed alcohol pads. Let dry.  Apply patch as indicated in monitor instructions. Patch will be placed under collarbone on left  side of chest with arrow pointing upward.  Rub patch adhesive wings for 2 minutes. Remove white label marked "1". Remove the white  label marked "2". Rub patch adhesive wings for 2 additional minutes.  While looking in a mirror, press and release button in center of patch. A small green light will  flash 3-4 times. This will be your only indicator that the monitor has been turned on.  Do not shower for the first 24 hours. You may shower after the first 24 hours.  Press the button if you feel a symptom. You will hear  a small click. Record Date, Time and  Symptom in the Patient Logbook.  When you are ready to remove the patch, follow instructions on the last 2 pages of Patient  Logbook. Stick patch monitor onto the last page of Patient Logbook.  Place Patient Logbook in the blue and white box. Use locking tab on box and tape box closed  securely. The blue and white box has prepaid postage on it. Please place it in the mailbox as  soon as possible. Your physician should have  your test results approximately 7 days after the  monitor has been mailed back to [facility].  Call Stella at [phone] if you have questions regarding  your ZIO XT patch monitor. Call them immediately if you see an orange light blinking on your  monitor.  If your monitor falls off in less than 4 days, contact our Monitor department at [phone].  If your monitor becomes loose or falls off after 4 days call [employer] at [phone] for  suggestions on securing your monitor    Follow-Up: At [facility], you and your health needs are our priority.  As part of our continuing mission to provide you with exceptional heart care, we have created designated Provider Care Teams.  These Care Teams include your primary Cardiologist (physician) and Advanced Practice Providers (APPs -  Physician Assistants and Nurse Practitioners) who all work together to provide you with the care you need, when you need it.  We recommend signing up for the patient portal called "MyChart".  Sign up information is provided on this After Visit Summary.  MyChart is used to connect with patients for Virtual Visits (Telemedicine).  Patients are able to view lab/test results, encounter notes, upcoming appointments, etc.  Non-urgent messages can be sent to your provider as well.   To learn more about what you can do with MyChart, go to [email or web address].    Your next appointment:   8 weeks with Dr Caryl Comes  Important Information About [place]

## 2021-11-01 NOTE — Progress Notes (Signed)
Patient ID: [name], male   DOB: Mar 28, 1945, 76 y.o.   MRN: [id number] o     Patient Care Team: Lurline Del, DO as PCP - General (Family Medicine) Fay Records, MD as PCP - Cardiology (Cardiology) Deboraha Sprang, MD as PCP - Electrophysiology (Cardiology)   HPI  Patrick Weaver is a 76 y.o. male seen in followup for pacer (Medtronic) implantation for bradycardia. Gen change 11/22. Significant problems with PVCs treated initially with flecainide with much improvement. As noted below, interval decrease in left ventricular function  prompted the discontinuation of flecainide and initiation of amiodarone. PVCs were obliterated but without interval improvement in LVEF. Hence he underwent catheterization.  8/22, discussions regarding upgrade of his pacemaker to high-voltage given his modest depression LV function.  It was elected, then in the context of Entresto therapy,  his borderline ejection fraction and age in the context of the [place] trial that we will hold off on upgrade.    Pancreatic mass identified which has had to undergo endoscopic surveillance rather than MRI 2/2 Device   10/23 seen by [facility] as DOD for shortness of breath.  Started on diuretics.  BNP turned out to be normal  Also seen by pulmonary 10/23.  Has a history of COVID-pneumonia which has been associated with dyspnea but his O2 sats in the office apparently were in the mid 90s Underwent PFTs and his DLCO was decreased.  The patient denies chest pain, nocturnal dyspnea, orthopnea or peripheral edema.  There have been no palpitations, lightheadedness or syncope.  Complains of significant shortness of breath with exertion and fatigue.  Sometimes, just walking across the house sometimes climbing up a flight of stairs.Marland Kitchen    DATE TEST EF%   8/15 Echo  65 %   7/17  Echo   25 % LAE (52/2.5/35)  12/17 Echo  25% AI  mild   2/18 Cath  No obstructive CAD  8/18 Echo  20-25%    12/18 CT Aorta  4.5 cm Aneruysm  9/19  Echo  25-30% LAE severe (57/2.6/28)  5/21 CT Aorta  Aneurysm 43 mm  10/21 Echo  35% AoRoot 46 mm  8/22 Echo  30-35% LAE severe  10/23 Echo  30-35% AoRoot 47 mm     Date Cr K Hgb TSH LFTs PFTs BNP  10/17     3.19 29     5/18  1.37   2.47 18 DLCO stable   9/19 1.28   2.72 17    10/21 1.26 (4/21)  14.2(12/21) 4.29 13    2/22 1.25 4.3       10/22 1.55 4.5   2.93 (8/22) 20 (8/22)  884  11/22 1.62  14.7        Past Medical History:  Diagnosis Date   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/2008   CT 12/18: Ascending aorta 4.5 cm, aortic root 4.6 cm , approximately 4.3 cm  (05/04/19)          Bladder stone    BPH (benign prostatic hyperplasia)    Bradycardia    a.s/p MDT dual chamber pacemaker   Chronic systolic heart failure ([place]) 08/13/2015   Echo 10/18: Moderate LVH, EF 25, mild AI, aortic annulus dilated at 53 mm, sinotubular junction is 34 mm, proximal ascending aorta is 39 mm // Echo 12/17: Mild LVH, EF 25-30, diffuse HK, mild AI, mild MR, severe LAE, mild RAE, PASP 42 // Echo 7/17: EF 25-30, severe diffuse HK (disproportionately severe HK  of the inferolateral and inferior myocardium), mild to moderate AI, aortic root 44 mm, ascend   Dyslipidemia    failed niaspan   History of echocardiogram    Echo 09/26/2017: EF 25-30, normal wall motion, grade 2 diastolic dysfunction, mild AI, severe LAE, mildly reduced RVSF   HTN (hypertension)    NICM (nonischemic cardiomyopathy) (HCC)    LHC2/18: no angiographic CAD   PVCs (premature ventricular contractions)    controlled on Amiodarone // Holter 11/17: PVCs 1.8%   Sleep apnea    USES C-PAP    Past Surgical History:  Procedure Laterality Date   CYSTOSCOPY WITH LITHOLAPAXY N/A 07/12/2014   Procedure: CYSTOSCOPY WITH LITHOLAPAXY;  Surgeon: Raynelle Bring, MD;  Location: [facility];  Service: Urology;  Laterality: N/A;   EP IMPLANTABLE DEVICE  2009   LEFT HEART CATH AND CORONARY ANGIOGRAPHY N/A 02/15/2016   Procedure: Left Heart Cath and Coronary  Angiography;  Surgeon: Burnell Blanks, MD;  Location: [place] [facility];  Service: Cardiovascular;  Laterality: N/A;   PPM GENERATOR CHANGEOUT N/A 11/04/2020   Procedure: PPM GENERATOR CHANGEOUT;  Surgeon: Deboraha Sprang, MD;  Location: [place] [facility];  Service: Cardiovascular;  Laterality: N/A;   TONSILLECTOMY     TRANSURETHRAL RESECTION OF PROSTATE N/A 07/12/2014   Procedure: TRANSURETHRAL RESECTION OF THE PROSTATE (TURP);  Surgeon: Raynelle Bring, MD;  Location: [facility];  Service: Urology;  Laterality: N/A;    Current Outpatient Medications  Medication Sig Dispense Refill   acetaminophen (TYLENOL) 325 MG tablet Take 2 tablets (650 mg total) by mouth every 6 (six) hours as needed for mild pain (or Fever >/= 101). 30 tablet 0   atorvastatin (LIPITOR) 10 MG tablet TAKE 1 TABLET BY MOUTH EVERY DAY 90 tablet 3   carvedilol (COREG) 6.25 MG tablet TAKE 1 TABLET BY MOUTH TWICE A DAY 180 tablet 2   empagliflozin (JARDIANCE) 10 MG TABS tablet Take 1 tablet (10 mg total) by mouth daily before breakfast. 90 tablet 3   finasteride (PROSCAR) 5 MG tablet Take 5 mg by mouth at bedtime.  11   fluticasone (FLONASE) 50 MCG/ACT nasal spray Place 1 spray into both nostrils daily as needed for rhinitis.     furosemide (LASIX) 40 MG tablet Take 1 tablet (40 mg total) by mouth 2 (two) times a week. 26 tablet 3   gabapentin (NEURONTIN) 300 MG capsule Take 300 mg by mouth at bedtime.     potassium chloride (KLOR-CON) 10 MEQ tablet Take 1 tablet (10 mEq total) by mouth daily. 90 tablet 3   sacubitril-valsartan (ENTRESTO) 24-26 MG TAKE 1 TABLET BY MOUTH TWICE A DAY. 180 tablet 3   spironolactone (ALDACTONE) 25 MG tablet Take 0.5 tablets (12.5 mg total) by mouth daily. 45 tablet 1   tamsulosin (FLOMAX) 0.4 MG CAPS capsule Take 0.4 mg by mouth daily as needed (helps with  urination).     No current facility-administered medications for this visit.    Allergies  Allergen Reactions   Codeine Nausea Only     Review of Systems negative except from HPI and PMH  Physical Exam: BP 110/68   Pulse 86   Ht 6' (1.829 m)   Wt 219 lb 3.2 oz (99.4 kg)   SpO2 94%   BMI 29.73 kg/m   Well developed and well nourished in no acute distress HENT normal Neck supple with JVP-flat Clear Device pocket well healed; without hematoma or erythema.  There is no tethering  Regular rate and rhythm,  no  gallop No  murmur Abd-soft with active BS No Clubbing cyanosis  edema Skin-warm and dry A & Oriented  Grossly normal sensory and motor function  ECG atrial pacing at 86 Intervals 32/13/41 Frequent PVCs with a left bundle indeterminate axis and late intrinsicoid deflection of about 80 ms  Device function is  normal. Programming changes to decrease the ADL rate from 100--90 and to increase the paced AV interval from 150--200 ms See Paceart for details    With exercise, patient developed any Wenckebach and went from atrial pacing with intrinsic conduction with a narrow QRS to normal AV pacing with a short AV delay.  Also noted with frequent PVCs   Asssessment and  Plan  Sinus node dysfunction  First-degree AV block-profound  Pacemaker-Medtronic    PVCs with intrinsicoid deflection of 80 ms   Amiodarone    HTN  Renal insufficiency grade 3  NICM  Dyspnea with a decreased DLCO  Congestive heart failure-chronic-systolic-class 0S-9Q  Aortic root dilatation with central AI  High Risk Medication Surveillance Amiodarone   Pancreatic mass  Exercise tolerance continues to be a problem.  It is somewhat episodic as well able to walk a lap around the office and 2 flights of stairs with little dyspnea.  Still, maybe adjustments as noted above.  Also noted while observing that the PVC burden significantly higher than the device is estimated.  We checked with Medtronic and the algorithm now has 2 features, 1 is the standard that is 2Vs without intervening A, the other is a V1- V2 interval that is less  than 69% of the average of the preceding 4 beats still, however it is markedly underestimating.  We will use a Zio patch.  Also concerned about amiodarone toxicity given his DLCO being reduced.  Ventricular pacing would also be contributing to dyspnea by reducing asymmetric contraction and exercise-induced MR.  He MADIT exercise echo.  He has been evaluated in the past for Baro-Stim; it may still be of some benefit.  There may be a role for shortening may be delayed in which case we will need to consider CRT upgrade  Needs amiodarone surveillance labs   We will repeat CTA to review for his thoracic aneurysm

## 2021-11-02 ENCOUNTER — Telehealth: Payer: Self-pay

## 2021-11-02 LAB — HEPATIC FUNCTION PANEL
ALT: 28 IU/L (ref 0–44)
AST: 24 IU/L (ref 0–40)
Albumin: 4.9 g/dL — ABNORMAL HIGH (ref 3.8–4.8)
Alkaline Phosphatase: 99 IU/L (ref 44–121)
Bilirubin Total: 0.5 mg/dL (ref 0.0–1.2)
Bilirubin, Direct: 0.16 mg/dL (ref 0.00–0.40)
Total Protein: 7 g/dL (ref 6.0–8.5)

## 2021-11-02 LAB — TSH: TSH: 4.49 u[IU]/mL (ref 0.450–4.500)

## 2021-11-02 NOTE — Telephone Encounter (Signed)
**Note De-Identified Nickolai Rinks Obfuscation** The pts completed [employer] application for Jardiance assistance was left at the office.  I have completed the providers page of his application and have e-mailed all to Dr Alan Ripper nurse so she can obtain her signature, date it, and to then fax all to [place] at the fax number written on the cove letter included.

## 2021-11-07 ENCOUNTER — Ambulatory Visit (INDEPENDENT_AMBULATORY_CARE_PROVIDER_SITE_OTHER): Payer: Medicare HMO

## 2021-11-07 DIAGNOSIS — I428 Other cardiomyopathies: Secondary | ICD-10-CM | POA: Diagnosis not present

## 2021-11-07 LAB — CUP PACEART REMOTE DEVICE CHECK
Battery Remaining Longevity: 139 mo
Battery Voltage: 3.02 V
Brady Statistic AP VP Percent: 10.49 %
Brady Statistic AP VS Percent: 87.93 %
Brady Statistic AS VP Percent: 0.14 %
Brady Statistic AS VS Percent: 1.44 %
Brady Statistic RA Percent Paced: 98.68 %
Brady Statistic RV Percent Paced: 10.63 %
Date Time Interrogation Session: 20231107022903
Implantable Lead Connection Status: 753985
Implantable Lead Connection Status: 753985
Implantable Lead Implant Date: 20090316
Implantable Lead Implant Date: 20090316
Implantable Lead Location: 753859
Implantable Lead Location: 753860
Implantable Lead Model: 5076
Implantable Lead Model: 5076
Implantable Pulse Generator Implant Date: 20221104
Lead Channel Impedance Value: 323 Ohm
Lead Channel Impedance Value: 361 Ohm
Lead Channel Impedance Value: 418 Ohm
Lead Channel Impedance Value: 475 Ohm
Lead Channel Pacing Threshold Amplitude: 0.5 V
Lead Channel Pacing Threshold Amplitude: 0.625 V
Lead Channel Pacing Threshold Pulse Width: 0.4 ms
Lead Channel Pacing Threshold Pulse Width: 0.4 ms
Lead Channel Sensing Intrinsic Amplitude: 1.75 mV
Lead Channel Sensing Intrinsic Amplitude: 1.75 mV
Lead Channel Sensing Intrinsic Amplitude: 8.5 mV
Lead Channel Sensing Intrinsic Amplitude: 8.5 mV
Lead Channel Setting Pacing Amplitude: 1.5 V
Lead Channel Setting Pacing Amplitude: 2 V
Lead Channel Setting Pacing Pulse Width: 0.4 ms
Lead Channel Setting Sensing Sensitivity: 4 mV
Zone Setting Status: 755011

## 2021-11-07 NOTE — Telephone Encounter (Signed)
[employer] forms signed and faxed. Sent to [facility] to be scanned in the chart.

## 2021-11-08 ENCOUNTER — Ambulatory Visit (HOSPITAL_COMMUNITY)
Admission: RE | Admit: 2021-11-08 | Discharge: 2021-11-08 | Disposition: A | Discharge status: Home / Self Care | Payer: Medicare HMO | Source: Ambulatory Visit | Attending: Internal Medicine | Admitting: Internal Medicine

## 2021-11-08 DIAGNOSIS — I712 Thoracic aortic aneurysm, without rupture, unspecified: Secondary | ICD-10-CM | POA: Diagnosis not present

## 2021-11-08 DIAGNOSIS — I7121 Aneurysm of the ascending aorta, without rupture: Secondary | ICD-10-CM | POA: Diagnosis not present

## 2021-11-08 MED ORDER — IOHEXOL 350 MG/ML SOLN
60.0000 mL | Freq: Once | INTRAVENOUS | Status: AC | PRN
  Administered 2021-11-08: 60 mL via INTRAVENOUS

## 2021-11-15 ENCOUNTER — Telehealth: Payer: Self-pay | Admitting: Gastroenterology

## 2021-11-15 ENCOUNTER — Encounter: Payer: Self-pay | Admitting: Internal Medicine

## 2021-11-15 NOTE — Telephone Encounter (Signed)
Patient wife called to schedule procedure for patient. He has GI hx with [facility] and [facility]. He was a former patient of Dr.Jacobs 10 years ago. Wife states patient would like all of his care under [facility]. She is going to request records from [facility] to be sent to our office for review.

## 2021-11-16 ENCOUNTER — Encounter: Payer: Self-pay | Admitting: Internal Medicine

## 2021-11-16 DIAGNOSIS — I493 Ventricular premature depolarization: Secondary | ICD-10-CM | POA: Diagnosis not present

## 2021-11-27 DIAGNOSIS — Z85828 Personal history of other malignant neoplasm of skin: Secondary | ICD-10-CM | POA: Diagnosis not present

## 2021-11-27 DIAGNOSIS — L814 Other melanin hyperpigmentation: Secondary | ICD-10-CM | POA: Diagnosis not present

## 2021-11-27 DIAGNOSIS — L918 Other hypertrophic disorders of the skin: Secondary | ICD-10-CM | POA: Diagnosis not present

## 2021-11-27 DIAGNOSIS — D2272 Melanocytic nevi of left lower limb, including hip: Secondary | ICD-10-CM | POA: Diagnosis not present

## 2021-11-27 DIAGNOSIS — Z86018 Personal history of other benign neoplasm: Secondary | ICD-10-CM | POA: Diagnosis not present

## 2021-11-27 DIAGNOSIS — L821 Other seborrheic keratosis: Secondary | ICD-10-CM | POA: Diagnosis not present

## 2021-11-27 DIAGNOSIS — L578 Other skin changes due to chronic exposure to nonionizing radiation: Secondary | ICD-10-CM | POA: Diagnosis not present

## 2021-11-27 DIAGNOSIS — D225 Melanocytic nevi of trunk: Secondary | ICD-10-CM | POA: Diagnosis not present

## 2021-11-30 ENCOUNTER — Telehealth: Payer: Self-pay

## 2021-11-30 NOTE — Telephone Encounter (Signed)
Forms faxed with delivery confirmation.

## 2021-11-30 NOTE — Telephone Encounter (Signed)
**Note De-Identified Payslee Bateson Obfuscation** The pts completed [employer]PA[employer] application for Entresto assistance was left at the office with documents.  I have completed the providers page of his application and have e-mailed all to Dr [name]' nurse so she can obtain her signature, date it, and to then fax all to [employer] at the fax number written on th cover letter included.

## 2021-12-01 NOTE — Progress Notes (Signed)
Remote pacemaker transmission.   

## 2021-12-03 ENCOUNTER — Encounter: Payer: Self-pay | Admitting: Internal Medicine

## 2021-12-04 NOTE — Telephone Encounter (Signed)
**Note De-Identified Velencia Lenart Obfuscation** Letter received from [facility] stating that they have denied the pt Patrick Weaver assistance because his income exceeds their limit. I have made the pt aware of this denial Patrick Weaver a [facility] message.

## 2021-12-15 NOTE — Telephone Encounter (Signed)
PATIENT WAS APPROVED FOR ENTRESTO UNTIL 01/01/23. ID# [id number], PHONE nUM [phone]

## 2021-12-19 DIAGNOSIS — I10 Essential (primary) hypertension: Secondary | ICD-10-CM | POA: Diagnosis not present

## 2021-12-19 DIAGNOSIS — N1831 Chronic kidney disease, stage 3a: Secondary | ICD-10-CM | POA: Diagnosis not present

## 2021-12-19 DIAGNOSIS — I5022 Chronic systolic (congestive) heart failure: Secondary | ICD-10-CM | POA: Diagnosis not present

## 2021-12-19 DIAGNOSIS — N400 Enlarged prostate without lower urinary tract symptoms: Secondary | ICD-10-CM | POA: Diagnosis not present

## 2022-01-02 ENCOUNTER — Other Ambulatory Visit: Payer: Self-pay

## 2022-01-02 ENCOUNTER — Ambulatory Visit: Payer: Medicare HMO | Attending: Internal Medicine

## 2022-01-02 DIAGNOSIS — Z6829 Body mass index (BMI) 29.0-29.9, adult: Secondary | ICD-10-CM | POA: Diagnosis not present

## 2022-01-02 DIAGNOSIS — Z125 Encounter for screening for malignant neoplasm of prostate: Secondary | ICD-10-CM | POA: Diagnosis not present

## 2022-01-02 DIAGNOSIS — N1831 Chronic kidney disease, stage 3a: Secondary | ICD-10-CM | POA: Diagnosis not present

## 2022-01-02 DIAGNOSIS — Z0289 Encounter for other administrative examinations: Secondary | ICD-10-CM | POA: Diagnosis not present

## 2022-01-02 DIAGNOSIS — I5022 Chronic systolic (congestive) heart failure: Secondary | ICD-10-CM | POA: Diagnosis not present

## 2022-01-02 DIAGNOSIS — E785 Hyperlipidemia, unspecified: Secondary | ICD-10-CM | POA: Diagnosis not present

## 2022-01-02 DIAGNOSIS — I10 Essential (primary) hypertension: Secondary | ICD-10-CM | POA: Diagnosis not present

## 2022-01-02 MED ORDER — ENTRESTO 24-26 MG PO TABS: ORAL_TABLET | ORAL | 3 refills | Status: DC

## 2022-01-03 DIAGNOSIS — Z23 Encounter for immunization: Secondary | ICD-10-CM | POA: Diagnosis not present

## 2022-01-03 DIAGNOSIS — Z1389 Encounter for screening for other disorder: Secondary | ICD-10-CM | POA: Diagnosis not present

## 2022-01-03 DIAGNOSIS — Z6830 Body mass index (BMI) 30.0-30.9, adult: Secondary | ICD-10-CM | POA: Diagnosis not present

## 2022-01-03 DIAGNOSIS — Z0000 Encounter for general adult medical examination without abnormal findings: Secondary | ICD-10-CM | POA: Diagnosis not present

## 2022-01-08 ENCOUNTER — Encounter: Payer: Self-pay | Admitting: Internal Medicine

## 2022-01-15 ENCOUNTER — Encounter: Payer: Medicare HMO | Admitting: Internal Medicine

## 2022-01-17 ENCOUNTER — Other Ambulatory Visit: Payer: Self-pay

## 2022-01-17 MED ORDER — EMPAGLIFLOZIN 10 MG PO TABS: 10.0000 mg | ORAL_TABLET | Freq: Every day | ORAL | 2 refills | Status: DC

## 2022-01-17 NOTE — Telephone Encounter (Signed)
Pt's medication was sent to pt's pharmacy as requested. Confirmation received.

## 2022-01-19 ENCOUNTER — Other Ambulatory Visit: Payer: Self-pay | Admitting: Gastroenterology

## 2022-01-22 ENCOUNTER — Telehealth: Payer: Self-pay | Admitting: *Deleted

## 2022-01-22 NOTE — Telephone Encounter (Signed)
   Pre-operative Risk Assessment    Patient Name: Patrick Weaver  DOB: 1945-05-24 MRN: [id number]      Request for Surgical Clearance    Procedure:   COLONOSCOPY  Date of Surgery:  Clearance 03/20/22                                 Surgeon:  DR. Alessandra Bevels Surgeon's Group or Practice Name:  [facility] Phone number:  [phone] Fax number:  [phone]   Type of Clearance Requested:   - Medical    Type of Anesthesia:   PROPOFOL   Additional requests/questions:    Astrid Divine   01/22/2022, 8:20 AM

## 2022-01-22 NOTE — Telephone Encounter (Signed)
   Name: Patrick Weaver  DOB: 07-15-1945  MRN: [id number]  Primary Cardiologist: Dorris Carnes, MD  Chart reviewed as part of pre-operative protocol coverage. The patient has an upcoming visit scheduled with Dr. Caryl Comes on 01/31/2022 at which time clearance can be addressed in case there are any issues that would impact surgical recommendations.  Colonoscopy is not scheduled until 03/20/2022 as below. I added preop FYI to appointment note so that provider is aware to address at time of outpatient visit.  Per office protocol the cardiology provider should forward their finalized clearance decision and recommendations regarding antiplatelet therapy to the requesting party below.    I will route this message as FYI to requesting party and remove this message from the preop box as separate preop APP input not needed at this time.   Please call with any questions.  Lenna Sciara, NP  01/22/2022, 11:54 AM

## 2022-01-25 ENCOUNTER — Telehealth: Payer: Self-pay

## 2022-01-25 NOTE — Telephone Encounter (Signed)
Spoke with pt and advised of Dr [name] recommendation to repeat monitor.  Pt states he wore the monitor the entire time as requested and is not sure what happened.  He will consider wearing again but states he has an appointment with Dr Caryl Comes on 01/31/2022 and can discuss then and if needed is willing to have monitor tech place on that day.  Pt advised will forward information to make Dr Caryl Comes and monitor techs aware.  Pt thanked Therapist, sports for the call.

## 2022-01-31 ENCOUNTER — Encounter: Payer: Self-pay | Admitting: Internal Medicine

## 2022-01-31 ENCOUNTER — Ambulatory Visit: Payer: Medicare HMO | Attending: Internal Medicine | Admitting: Internal Medicine

## 2022-01-31 VITALS — BP 110/56 | HR 68 | Ht 72.0 in | Wt 220.4 lb

## 2022-01-31 DIAGNOSIS — I5022 Chronic systolic (congestive) heart failure: Secondary | ICD-10-CM

## 2022-01-31 DIAGNOSIS — I495 Sick sinus syndrome: Secondary | ICD-10-CM

## 2022-01-31 DIAGNOSIS — I493 Ventricular premature depolarization: Secondary | ICD-10-CM | POA: Diagnosis not present

## 2022-01-31 DIAGNOSIS — I440 Atrioventricular block, first degree: Secondary | ICD-10-CM

## 2022-01-31 DIAGNOSIS — Z950 Presence of cardiac pacemaker: Secondary | ICD-10-CM | POA: Diagnosis not present

## 2022-01-31 DIAGNOSIS — I428 Other cardiomyopathies: Secondary | ICD-10-CM

## 2022-01-31 NOTE — Progress Notes (Signed)
Patient ID: Patrick Weaver, male   DOB: [birth year], 77 y.o.   MRN: [id number]     Patient Care Team: Lurline Del, DO as PCP - General (Family Medicine) Fay Records, MD as PCP - Cardiology (Cardiology) Deboraha Sprang, MD as PCP - Electrophysiology (Cardiology)   HPI  Patrick Weaver is a 77 y.o. male seen in followup for pacer (Medtronic) implantation for bradycardia. Gen change 11/22. Significant problems with PVCs treated initially with flecainide with much improvement. As noted below, interval decrease in left ventricular function  prompted the discontinuation of flecainide and initiation of amiodarone. PVCs were obliterated but without interval improvement in LVEF. Hence he underwent catheterization.  8/22, discussions regarding upgrade of his pacemaker to high-voltage given his modest depression LV function.  It was elected, then in the context of Entresto therapy,  his borderline ejection fraction and age in the context of the [place] trial that we will hold off on upgrade.    Pancreatic mass identified which has had to undergo endoscopic surveillance rather than MRI 2/2 Device   10/23 seen by Dr. [facility] as DOD for shortness of breath.  Started on diuretics.  BNP turned out to be normal  Also seen by pulmonary 10/23.  Has a history of COVID-pneumonia which has been associated with dyspnea but his O2 sats in the office apparently were in the mid 90s Underwent PFTs and his DLCO was decreased.  The context of amiodarone  Event recorder was undertaken for PVCs, 5.2% 1/24  Per his perspective as well as his wife, he is actually doing considerably better.  Really no limitations even working on his automobile.  Perhaps better following reprogramming with decreasing his ADL rate from 95--90 and decreasing the slope DATE TEST EF%   8/15 Echo  65 %   7/17  Echo   25 % LAE (52/2.5/35)  12/17 Echo  25% AI  mild   2/18 Cath  No obstructive CAD  8/18 Echo  20-25%    12/18 CT Aorta  4.5 cm  Aneruysm  9/19 Echo  25-30% LAE severe (57/2.6/28)  5/21 CT Aorta  Aneurysm 43 mm  10/21 Echo  35% AoRoot 46 mm  8/22 Echo  30-35% LAE severe  10/23 Echo  30-35% AoRoot 47 mm     Date Cr K Hgb TSH LFTs PFTs BNP  10/17     3.19 29     5/18  1.37   2.47 18 DLCO stable   9/19 1.28   2.72 17    10/21 1.26 (4/21)  14.2(12/21) 4.29 13    2/22 1.25 4.3       10/22 1.55 4.5   2.93 (8/22) 20 (8/22)  884  11/22 1.62  14.7      10/23 1.59 4.0 15.2 4.49 28      Past Medical History:  Diagnosis Date   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   CT 12/18: Ascending aorta 4.5 cm, aortic root 4.6 cm , approximately 4.3 cm  (05/04/19)          Bladder stone    BPH (benign prostatic hyperplasia)    Bradycardia    a.s/p MDT dual chamber pacemaker   Chronic systolic heart failure ([place]) 08/13/2015   Echo 10/18: Moderate LVH, EF 25, mild AI, aortic annulus dilated at 53 mm, sinotubular junction is 34 mm, proximal ascending aorta is 39 mm // Echo 12/17: Mild LVH, EF 25-30, diffuse HK, mild AI, mild MR, severe LAE, mild  RAE, PASP 42 // Echo 7/17: EF 25-30, severe diffuse HK (disproportionately severe HK of the inferolateral and inferior myocardium), mild to moderate AI, aortic root 44 mm, ascend   Dyslipidemia    failed niaspan   History of echocardiogram    Echo 09/26/2017: EF 25-30, normal wall motion, grade 2 diastolic dysfunction, mild AI, severe LAE, mildly reduced RVSF   HTN (hypertension)    NICM (nonischemic cardiomyopathy) (HCC)    LHC2/18: no angiographic CAD   PVCs (premature ventricular contractions)    controlled on Amiodarone // Holter 11/17: PVCs 1.8%   Sleep apnea    USES C-PAP    Past Surgical History:  Procedure Laterality Date   CYSTOSCOPY WITH LITHOLAPAXY N/A 07/12/2014   Procedure: CYSTOSCOPY WITH LITHOLAPAXY;  Surgeon: Raynelle Bring, MD;  Location: [facility];  Service: Urology;  Laterality: N/A;   EP IMPLANTABLE DEVICE  2009   LEFT HEART CATH AND CORONARY ANGIOGRAPHY N/A  02/15/2016   Procedure: Left Heart Cath and Coronary Angiography;  Surgeon: Burnell Blanks, MD;  Location: [facility];  Service: Cardiovascular;  Laterality: N/A;   PPM GENERATOR CHANGEOUT N/A 11/04/2020   Procedure: PPM GENERATOR CHANGEOUT;  Surgeon: Deboraha Sprang, MD;  Location: [facility];  Service: Cardiovascular;  Laterality: N/A;   TONSILLECTOMY     TRANSURETHRAL RESECTION OF PROSTATE N/A 07/12/2014   Procedure: TRANSURETHRAL RESECTION OF THE PROSTATE (TURP);  Surgeon: Raynelle Bring, MD;  Location: [facility];  Service: Urology;  Laterality: N/A;    Current Outpatient Medications  Medication Sig Dispense Refill   acetaminophen (TYLENOL) 325 MG tablet Take 2 tablets (650 mg total) by mouth every 6 (six) hours as needed for mild pain (or Fever >/= 101). 30 tablet 0   amiodarone (PACERONE) 200 MG tablet Take 200 mg by mouth daily. Take 1 tablet by mouth daily Monday-Friday     atorvastatin (LIPITOR) 10 MG tablet TAKE 1 TABLET BY MOUTH EVERY DAY 90 tablet 3   carvedilol (COREG) 6.25 MG tablet TAKE 1 TABLET BY MOUTH TWICE A DAY 180 tablet 2   empagliflozin (JARDIANCE) 10 MG TABS tablet Take 1 tablet (10 mg total) by mouth daily before breakfast. 90 tablet 2   finasteride (PROSCAR) 5 MG tablet Take 5 mg by mouth at bedtime.  11   fluticasone (FLONASE) 50 MCG/ACT nasal spray Place 1 spray into both nostrils daily as needed for rhinitis.     furosemide (LASIX) 40 MG tablet Take 1 tablet (40 mg total) by mouth 2 (two) times a week. (Patient taking differently: Take 40 mg by mouth once a week.) 26 tablet 3   gabapentin (NEURONTIN) 300 MG capsule Take 300 mg by mouth at bedtime.     potassium chloride (KLOR-CON) 10 MEQ tablet Take 1 tablet (10 mEq total) by mouth daily. (Patient taking differently: Take 10 mEq by mouth once a week.) 90 tablet 3   sacubitril-valsartan (ENTRESTO) 24-26 MG TAKE 1 TABLET BY MOUTH TWICE A DAY. 180 tablet 3   spironolactone (ALDACTONE) 25 MG tablet Take  0.5 tablets (12.5 mg total) by mouth daily. 45 tablet 1   tamsulosin (FLOMAX) 0.4 MG CAPS capsule Take 0.4 mg by mouth daily as needed (helps with  urination).     No current facility-administered medications for this visit.    Allergies  Allergen Reactions   Codeine Nausea Only    Review of Systems negative except from HPI and PMH  Physical Exam: BP (!) 110/56   Pulse 68  Ht 6' (1.829 m)   Wt 220 lb 6.4 oz (100 kg)   SpO2 93%   BMI 29.89 kg/m   Well developed and well nourished in no acute distress HENT normal Neck supple with JVP-flat Clear Device pocket well healed; without hematoma or erythema.  There is no tethering  Regular rate and rhythm, no  gallop No murmur Abd-soft with active BS No Clubbing cyanosis  edema Skin-warm and dry A & Oriented  Grossly normal sensory and motor function  ECG sinus at 73 Intervals 30/12/43 PVCs  Device function is normal. 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  PVC burden turned out to be surprisingly low.  Will continue his current medications which she is tolerating, including his carvedilol 6.25 and for his cardiomyopathy Aldactone Entresto and empagliflozin  Continue his amiodarone at 200 mg 5 days a week.  Surveillance laboratories were okay  Blood pressure is well-controlled on the aforementioned regime

## 2022-01-31 NOTE — Patient Instructions (Signed)
Medication Instructions:  Your physician recommends that you continue on your current medications as directed. Please refer to the Current Medication list given to you today.  *If you need a refill on your cardiac medications before your next appointment, please call your pharmacy*   Lab Work: None ordered.  If you have labs (blood work) drawn today and your tests are completely normal, you will receive your results only by: [place] (if you have MyChart) OR A paper copy in the mail If you have any lab test that is abnormal or we need to change your treatment, we will call you to review the results.   Testing/Procedures: None ordered.    Follow-Up: At [facility], you and your health needs are our priority.  As part of our continuing mission to provide you with exceptional heart care, we have created designated Provider Care Teams.  These Care Teams include your primary Cardiologist (physician) and Advanced Practice Providers (APPs -  Physician Assistants and Nurse Practitioners) who all work together to provide you with the care you need, when you need it.  We recommend signing up for the patient portal called "MyChart".  Sign up information is provided on this After Visit Summary.  MyChart is used to connect with patients for Virtual Visits (Telemedicine).  Patients are able to view lab/test results, encounter notes, upcoming appointments, etc.  Non-urgent messages can be sent to your provider as well.   To learn more about what you can do with MyChart, go to [email or web address].    Your next appointment:   6 months with Dr Caryl Comes

## 2022-02-01 LAB — CUP PACEART INCLINIC DEVICE CHECK
Battery Remaining Longevity: 137 mo
Battery Voltage: 3.02 V
Brady Statistic AP VP Percent: 10.3 %
Brady Statistic AP VS Percent: 88.21 %
Brady Statistic AS VP Percent: 0.13 %
Brady Statistic AS VS Percent: 1.36 %
Brady Statistic RA Percent Paced: 98.8 %
Brady Statistic RV Percent Paced: 10.43 %
Date Time Interrogation Session: 20240131214154
Implantable Lead Connection Status: 753985
Implantable Lead Connection Status: 753985
Implantable Lead Implant Date: 20090316
Implantable Lead Implant Date: 20090316
Implantable Lead Location: 753859
Implantable Lead Location: 753860
Implantable Lead Model: 5076
Implantable Lead Model: 5076
Implantable Pulse Generator Implant Date: 20221104
Lead Channel Impedance Value: 304 Ohm
Lead Channel Impedance Value: 361 Ohm
Lead Channel Impedance Value: 418 Ohm
Lead Channel Impedance Value: 494 Ohm
Lead Channel Pacing Threshold Amplitude: 0.5 V
Lead Channel Pacing Threshold Amplitude: 0.625 V
Lead Channel Pacing Threshold Amplitude: 0.75 V
Lead Channel Pacing Threshold Pulse Width: 0.4 ms
Lead Channel Pacing Threshold Pulse Width: 0.4 ms
Lead Channel Pacing Threshold Pulse Width: 0.4 ms
Lead Channel Sensing Intrinsic Amplitude: 1.625 mV
Lead Channel Sensing Intrinsic Amplitude: 2 mV
Lead Channel Sensing Intrinsic Amplitude: 7.125 mV
Lead Channel Sensing Intrinsic Amplitude: 8.375 mV
Lead Channel Setting Pacing Amplitude: 1.5 V
Lead Channel Setting Pacing Amplitude: 2 V
Lead Channel Setting Pacing Pulse Width: 0.4 ms
Lead Channel Setting Sensing Sensitivity: 4 mV
Zone Setting Status: 755011

## 2022-02-01 NOTE — Addendum Note (Signed)
Addended by: Michelle Nasuti on: 02/01/2022 01:07 PM   Modules accepted: Orders

## 2022-02-06 ENCOUNTER — Ambulatory Visit: Payer: Medicare HMO

## 2022-02-06 DIAGNOSIS — I495 Sick sinus syndrome: Secondary | ICD-10-CM

## 2022-02-06 LAB — CUP PACEART REMOTE DEVICE CHECK
Battery Remaining Longevity: 137 mo
Battery Voltage: 3.02 V
Brady Statistic AP VP Percent: 14.01 %
Brady Statistic AP VS Percent: 82.88 %
Brady Statistic AS VP Percent: 0.59 %
Brady Statistic AS VS Percent: 2.52 %
Brady Statistic RA Percent Paced: 97.29 %
Brady Statistic RV Percent Paced: 14.61 %
Date Time Interrogation Session: 20240205213532
Implantable Lead Connection Status: 753985
Implantable Lead Connection Status: 753985
Implantable Lead Implant Date: 20090316
Implantable Lead Implant Date: 20090316
Implantable Lead Location: 753859
Implantable Lead Location: 753860
Implantable Lead Model: 5076
Implantable Lead Model: 5076
Implantable Pulse Generator Implant Date: 20221104
Lead Channel Impedance Value: 323 Ohm
Lead Channel Impedance Value: 380 Ohm
Lead Channel Impedance Value: 399 Ohm
Lead Channel Impedance Value: 456 Ohm
Lead Channel Pacing Threshold Amplitude: 0.5 V
Lead Channel Pacing Threshold Amplitude: 0.875 V
Lead Channel Pacing Threshold Pulse Width: 0.4 ms
Lead Channel Pacing Threshold Pulse Width: 0.4 ms
Lead Channel Sensing Intrinsic Amplitude: 2 mV
Lead Channel Sensing Intrinsic Amplitude: 2 mV
Lead Channel Sensing Intrinsic Amplitude: 7 mV
Lead Channel Sensing Intrinsic Amplitude: 7 mV
Lead Channel Setting Pacing Amplitude: 1.5 V
Lead Channel Setting Pacing Amplitude: 2 V
Lead Channel Setting Pacing Pulse Width: 0.4 ms
Lead Channel Setting Sensing Sensitivity: 4 mV
Zone Setting Status: 755011
Zone Setting Status: 755011

## 2022-03-06 NOTE — Progress Notes (Signed)
Remote pacemaker transmission.   

## 2022-03-08 ENCOUNTER — Telehealth: Payer: Self-pay

## 2022-03-08 NOTE — Telephone Encounter (Signed)
Acceptable risk for colonoscopy Thanks SK

## 2022-03-08 NOTE — Telephone Encounter (Signed)
   Pre-operative Risk Assessment    Patient Name: Patrick Weaver  DOB: 14-Jan-1945 MRN: [id number]     Request for Surgical Clearance    Procedure: Colonoscopy   Date of Surgery:  Clearance 03/20/22                                 Surgeon:  Dr. Alessandra Bevels  Surgeon's Group or Practice Name:  [facility]  Phone number:  [phone] Fax number:  [phone]   Type of Clearance Requested:   - Medical    Type of Anesthesia:  Propofol    Additional requests/questions:    SignedMendel Ryder   03/08/2022, 11:38 AM

## 2022-03-08 NOTE — Telephone Encounter (Signed)
Dr. Caryl Comes, you recently saw this patient on 01/31/2022. We have received request for medical clearance for upcoming colonoscopy and per office protocol, he is < 2 months since office visit.  Could you please comment on clearance to undergo low risk procedure.  Please route your response to p cv div preop.  Thank you, Sharyn Lull

## 2022-03-09 NOTE — Telephone Encounter (Signed)
   Primary Cardiologist: Dorris Carnes, MD  Chart reviewed as part of pre-operative protocol coverage. Given past medical history and time since last visit, based on ACC/AHA guidelines, [facility] RINGLER would be at acceptable risk for the planned procedure without further cardiovascular testing.   Patient was advised that if he develops new symptoms prior to surgery to contact our office to arrange a follow-up appointment. He verbalized understanding.  I will route this recommendation to the requesting party via Epic fax function and remove from pre-op pool.  Please call with questions.  Emmaline Life, NP-C  03/09/2022, 8:00 AM [address] Office [phone] Fax [phone]

## 2022-03-13 ENCOUNTER — Encounter (HOSPITAL_COMMUNITY): Payer: Self-pay | Admitting: Gastroenterology

## 2022-03-20 ENCOUNTER — Other Ambulatory Visit: Payer: Self-pay

## 2022-03-20 ENCOUNTER — Ambulatory Visit (HOSPITAL_COMMUNITY): Payer: Medicare HMO | Admitting: Certified Registered"

## 2022-03-20 ENCOUNTER — Encounter (HOSPITAL_COMMUNITY): Payer: Self-pay | Admitting: Gastroenterology

## 2022-03-20 ENCOUNTER — Encounter (HOSPITAL_COMMUNITY)
Admission: RE | Disposition: A | Discharge status: Home / Self Care | Payer: Self-pay | Source: Home / Self Care | Attending: Gastroenterology

## 2022-03-20 ENCOUNTER — Ambulatory Visit (HOSPITAL_COMMUNITY)
Admission: RE | Admit: 2022-03-20 | Discharge: 2022-03-20 | Disposition: A | Discharge status: Home / Self Care | Payer: Medicare HMO | Attending: Gastroenterology | Admitting: Gastroenterology

## 2022-03-20 ENCOUNTER — Ambulatory Visit (HOSPITAL_BASED_OUTPATIENT_CLINIC_OR_DEPARTMENT_OTHER): Payer: Medicare HMO | Admitting: Certified Registered"

## 2022-03-20 DIAGNOSIS — I10 Essential (primary) hypertension: Secondary | ICD-10-CM | POA: Diagnosis not present

## 2022-03-20 DIAGNOSIS — I351 Nonrheumatic aortic (valve) insufficiency: Secondary | ICD-10-CM | POA: Diagnosis not present

## 2022-03-20 DIAGNOSIS — K5730 Diverticulosis of large intestine without perforation or abscess without bleeding: Secondary | ICD-10-CM | POA: Diagnosis not present

## 2022-03-20 DIAGNOSIS — D120 Benign neoplasm of cecum: Secondary | ICD-10-CM | POA: Insufficient documentation

## 2022-03-20 DIAGNOSIS — D123 Benign neoplasm of transverse colon: Secondary | ICD-10-CM | POA: Insufficient documentation

## 2022-03-20 DIAGNOSIS — K644 Residual hemorrhoidal skin tags: Secondary | ICD-10-CM | POA: Diagnosis not present

## 2022-03-20 DIAGNOSIS — Z9989 Dependence on other enabling machines and devices: Secondary | ICD-10-CM | POA: Diagnosis not present

## 2022-03-20 DIAGNOSIS — K648 Other hemorrhoids: Secondary | ICD-10-CM | POA: Diagnosis not present

## 2022-03-20 DIAGNOSIS — Z86010 Personal history of colonic polyps: Secondary | ICD-10-CM | POA: Diagnosis not present

## 2022-03-20 DIAGNOSIS — D125 Benign neoplasm of sigmoid colon: Secondary | ICD-10-CM | POA: Insufficient documentation

## 2022-03-20 DIAGNOSIS — D122 Benign neoplasm of ascending colon: Secondary | ICD-10-CM

## 2022-03-20 DIAGNOSIS — Z950 Presence of cardiac pacemaker: Secondary | ICD-10-CM | POA: Diagnosis not present

## 2022-03-20 DIAGNOSIS — Z79899 Other long term (current) drug therapy: Secondary | ICD-10-CM | POA: Diagnosis not present

## 2022-03-20 DIAGNOSIS — I5022 Chronic systolic (congestive) heart failure: Secondary | ICD-10-CM | POA: Diagnosis not present

## 2022-03-20 DIAGNOSIS — Z1211 Encounter for screening for malignant neoplasm of colon: Secondary | ICD-10-CM | POA: Insufficient documentation

## 2022-03-20 DIAGNOSIS — I110 Hypertensive heart disease with heart failure: Secondary | ICD-10-CM | POA: Insufficient documentation

## 2022-03-20 DIAGNOSIS — I429 Cardiomyopathy, unspecified: Secondary | ICD-10-CM | POA: Diagnosis not present

## 2022-03-20 DIAGNOSIS — G4733 Obstructive sleep apnea (adult) (pediatric): Secondary | ICD-10-CM | POA: Diagnosis not present

## 2022-03-20 HISTORY — PX: POLYPECTOMY: SHX5525

## 2022-03-20 HISTORY — PX: COLONOSCOPY WITH PROPOFOL: SHX5780

## 2022-03-20 SURGERY — COLONOSCOPY WITH PROPOFOL
Anesthesia: Monitor Anesthesia Care

## 2022-03-20 MED ORDER — PROPOFOL 10 MG/ML IV BOLUS
INTRAVENOUS | Status: DC | PRN
  Administered 2022-03-20: 30 mg via INTRAVENOUS

## 2022-03-20 MED ORDER — PHENYLEPHRINE 80 MCG/ML (10ML) SYRINGE FOR IV PUSH (FOR BLOOD PRESSURE SUPPORT)
PREFILLED_SYRINGE | INTRAVENOUS | Status: DC | PRN
  Administered 2022-03-20: 80 ug via INTRAVENOUS
  Administered 2022-03-20 (×2): 160 ug via INTRAVENOUS

## 2022-03-20 MED ORDER — PROPOFOL 500 MG/50ML IV EMUL
INTRAVENOUS | Status: DC | PRN
  Administered 2022-03-20: 125 ug/kg/min via INTRAVENOUS

## 2022-03-20 MED ORDER — LACTATED RINGERS IV SOLN: INTRAVENOUS | Status: DC | PRN

## 2022-03-20 MED ORDER — SODIUM CHLORIDE 0.9 % IV SOLN: INTRAVENOUS | Status: DC

## 2022-03-20 MED ORDER — LACTATED RINGERS IV SOLN
INTRAVENOUS | Status: AC | PRN
  Administered 2022-03-20: 10 mL/h via INTRAVENOUS

## 2022-03-20 SURGICAL SUPPLY — 22 items

## 2022-03-20 NOTE — Discharge Instructions (Signed)

## 2022-03-20 NOTE — Anesthesia Preprocedure Evaluation (Addendum)
Anesthesia Evaluation  Patient identified by MRN, date of birth, ID band Patient awake    Reviewed: Allergy & Precautions, NPO status , Patient's Chart, lab work & pertinent test results, reviewed documented beta blocker date and time   Airway Mallampati: IV  TM Distance: >3 FB Neck ROM: Full  Mouth opening: Limited Mouth Opening  Dental no notable dental hx. (+) Teeth Intact, Dental Advisory Given   Pulmonary sleep apnea and Continuous Positive Airway Pressure Ventilation    Pulmonary exam normal breath sounds clear to auscultation       Cardiovascular hypertension, Pt. on medications and Pt. on home beta blockers Normal cardiovascular exam+ dysrhythmias + pacemaker + Valvular Problems/Murmurs (mild/mod AI) AI  Rhythm:Regular Rate:Normal  TTE 2023 1. Left ventricular ejection fraction, by estimation, is 30 to 35%. The  left ventricle has moderately decreased function. The left ventricle  demonstrates global hypokinesis. The left ventricular internal cavity size  was mildly dilated. There is moderate   left ventricular hypertrophy. Left ventricular diastolic parameters are  consistent with Grade I diastolic dysfunction (impaired relaxation).   2. Right ventricular systolic function is normal. The right ventricular  size is mildly enlarged. There is normal pulmonary artery systolic  pressure.   3. Left atrial size was mildly dilated.   4. The mitral valve is normal in structure. Trivial mitral valve  regurgitation. No evidence of mitral stenosis.   5. The aortic valve is tricuspid. Aortic valve regurgitation is mild to  moderate. No aortic stenosis is present.   6. Aortic dilatation noted. Aneurysm of the aortic root, measuring 47 mm.  There is dilatation of the ascending aorta, measuring 44 mm.   Cath 2018 1. No angiographic evidence of CAD 2. Non-ischemic cardiomyopathy     Neuro/Psych negative neurological ROS  negative  psych ROS   GI/Hepatic negative GI ROS, Neg liver ROS,,,  Endo/Other  negative endocrine ROS    Renal/GU Renal InsufficiencyRenal disease  negative genitourinary   Musculoskeletal negative musculoskeletal ROS (+)    Abdominal   Peds  Hematology negative hematology ROS (+)   Anesthesia Other Findings Labs?  Reproductive/Obstetrics                             Anesthesia Physical Anesthesia Plan  ASA: 3  Anesthesia Plan: MAC   Post-op Pain Management:    Induction: Intravenous  PONV Risk Score and Plan: Propofol infusion and Treatment may vary due to age or medical condition  Airway Management Planned: Natural Airway  Additional Equipment:   Intra-op Plan:   Post-operative Plan:   Informed Consent: I have reviewed the patients History and Physical, chart, labs and discussed the procedure including the risks, benefits and alternatives for the proposed anesthesia with the patient or authorized representative who has indicated his/her understanding and acceptance.     Dental advisory given  Plan Discussed with: CRNA  Anesthesia Plan Comments:        Anesthesia Quick Evaluation

## 2022-03-20 NOTE — Transfer of Care (Signed)
Immediate Anesthesia Transfer of Care Note  Patient: Patrick Weaver  Procedure(s) Performed: COLONOSCOPY WITH PROPOFOL POLYPECTOMY  Patient Location: PACU and Endoscopy Unit  Anesthesia Type:MAC  Level of Consciousness: awake and drowsy  Airway & Oxygen Therapy: Patient Spontanous Breathing  Post-op Assessment: Report given to RN and Post -op Vital signs reviewed and stable  Post vital signs: Reviewed and stable  Last Vitals:  Vitals Value Taken Time  BP    Temp    Pulse    Resp    SpO2      Last Pain:  Vitals:   03/20/22 1005  TempSrc: Temporal  PainSc: 0-No pain         Complications: No notable events documented.

## 2022-03-20 NOTE — H&P (Signed)
Primary Care Physician:  Lurline Del, DO Primary Gastroenterologist:  Dr. Shann Medal  Reason for Consultation: Outpatient surveillance colonoscopy  HPI: Patrick Weaver is a 77 y.o. male here for outpatient surveillance colonoscopy for personal history of adenomatous polyp. 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 Outpatient MRI was recommended. CT scan in December 2019 did not mention of any pancreatic lesion.        Status post EUS 09/14/2019 at [facility]. Low CEA. 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 Repeat imaging recommended in one year.        Patient denies any GI symptoms. He denies any abdominal pain, nausea or vomiting. Denies any blood in the stool or black stool. Occasional constipation. Denies any reflux, and trouble swallowing or pain while swallowing. Denies unintentional weight loss.        Last colonoscopy in 2013 by Dr. Ardis Hughs showed 3 tubular adenomas and repeat was recommended in 3 years.  Past Medical History:  Diagnosis Date   Aneurysm of thoracic aorta ([place]) 07/06/2008   CT 12/18: Ascending aorta 4.5 cm, aortic root 4.6 cm , approximately 4.3 cm  (05/04/19)          Bladder stone    BPH (benign prostatic hyperplasia)    Bradycardia    a.s/p MDT dual chamber pacemaker   Chronic systolic heart failure ([place]) 08/13/2015   Echo 10/18: Moderate LVH, EF 25, mild AI, aortic annulus dilated at 53 mm, sinotubular junction is 34 mm, proximal ascending aorta is 39 mm // Echo 12/17: Mild LVH, EF 25-30, diffuse HK, mild AI, mild MR, severe LAE, mild RAE, PASP 42 // Echo 7/17: EF 25-30, severe diffuse HK (disproportionately severe HK of the inferolateral and  inferior myocardium), mild to moderate AI, aortic root 44 mm, ascend   Dyslipidemia    failed niaspan   History of echocardiogram    Echo 09/26/2017: EF 25-30, normal wall motion, grade 2 diastolic dysfunction, mild AI, severe LAE, mildly reduced RVSF   HTN (hypertension)    NICM (nonischemic cardiomyopathy) (HCC)    LHC2/18: no angiographic CAD   PVCs (premature ventricular contractions)    controlled on Amiodarone // Holter 11/17: PVCs 1.8%   Sleep apnea    USES C-PAP    Past Surgical History:  Procedure Laterality Date   CYSTOSCOPY WITH LITHOLAPAXY N/A 07/12/2014   Procedure: CYSTOSCOPY WITH LITHOLAPAXY;  Surgeon: Raynelle Bring, MD;  Location: [facility];  Service: Urology;  Laterality: N/A;   EP IMPLANTABLE DEVICE  2009   LEFT HEART CATH AND CORONARY ANGIOGRAPHY N/A 02/15/2016   Procedure: Left Heart Cath and Coronary Angiography;  Surgeon: Burnell Blanks, MD;  Location: [place] [facility];  Service: Cardiovascular;  Laterality: N/A;   PPM GENERATOR CHANGEOUT N/A 11/04/2020   Procedure: PPM GENERATOR CHANGEOUT;  Surgeon: Deboraha Sprang, MD;  Location: [place] [facility];  Service: Cardiovascular;  Laterality: N/A;   TONSILLECTOMY     TRANSURETHRAL RESECTION OF PROSTATE N/A 07/12/2014   Procedure: TRANSURETHRAL RESECTION OF THE PROSTATE (TURP);  Surgeon: Raynelle Bring, MD;  Location: [facility];  Service: Urology;  Laterality: N/A;    Prior to Admission medications   Medication Sig Start Date End Date  Taking? Authorizing Provider  amiodarone (PACERONE) 200 MG tablet Take 200 mg by mouth See admin instructions. Take 200 mg by mouth daily Monday-Friday   Yes [provider]  carvedilol (COREG) 6.25 MG tablet TAKE 1 TABLET BY MOUTH TWICE A DAY 10/23/21  Yes Fay Records, MD  empagliflozin (JARDIANCE) 10 MG TABS tablet Take 1 tablet (10 mg total) by mouth daily before breakfast. 01/17/22  Yes Fay Records, MD  finasteride (PROSCAR) 5 MG tablet Take 5 mg by mouth at bedtime.  08/10/17  Yes [provider]  furosemide (LASIX) 40 MG tablet Take 1 tablet (40 mg total) by mouth 2 (two) times a week. Patient taking differently: Take 40 mg by mouth once a week. 10/05/21  Yes Chandrasekhar, Mahesh A, MD  gabapentin (NEURONTIN) 300 MG capsule Take 300 mg by mouth at bedtime. 01/01/13  Yes [provider]  potassium chloride (KLOR-CON) 10 MEQ tablet Take 1 tablet (10 mEq total) by mouth daily. Patient taking differently: Take 10 mEq by mouth once a week. 10/02/21  Yes Chandrasekhar, Terisa Starr, MD  sacubitril-valsartan (ENTRESTO) 24-26 MG TAKE 1 TABLET BY MOUTH TWICE A DAY. 01/02/22  Yes Deboraha Sprang, MD  spironolactone (ALDACTONE) 25 MG tablet Take 0.5 tablets (12.5 mg total) by mouth daily. Patient taking differently: Take 12.5 mg by mouth at bedtime. 10/04/21  Yes Deboraha Sprang, MD  tamsulosin (FLOMAX) 0.4 MG CAPS capsule Take 0.4 mg by mouth daily as needed (helps with  urination). 06/28/15  Yes [provider]  acetaminophen (TYLENOL) 325 MG tablet Take 2 tablets (650 mg total) by mouth every 6 (six) hours as needed for mild pain (or Fever >/= 101). 06/26/14   Robbie Lis, MD  atorvastatin (LIPITOR) 10 MG tablet TAKE 1 TABLET BY MOUTH EVERY DAY Patient taking differently: Take 10 mg by mouth at bedtime. 04/03/21   Fay Records, MD  fluticasone [facility]) 50 MCG/ACT nasal spray Place 1 spray into both nostrils daily as needed for rhinitis.    [provider]    Scheduled Meds: Continuous Infusions:  sodium chloride     lactated ringers 10 mL/hr (03/20/22 1010)   PRN Meds:.lactated ringers  Allergies as of 01/19/2022 - Review Complete 11/01/2021  Allergen Reaction Noted   Codeine Nausea Only 04/30/2007    Family History  Problem Relation Age of Onset   Hypertension Mother    Cancer Mother    Congestive Heart Failure Father    Heart disease Father    Heart attack Father    Colon cancer Maternal Grandfather    Hypertension Brother     Stomach cancer Neg Hx    Esophageal cancer Neg Hx     Social History   Socioeconomic History   Marital status: Married    Spouse name: Not on file   Number of children: 3   Years of education: Not on file   Highest education level: Not on file  Occupational History   Not on file  Tobacco Use   Smoking status: Never   Smokeless tobacco: Never  Vaping Use   Vaping Use: Never used  Substance and Sexual Activity   Alcohol use: No   Drug use: No   Sexual activity: Not on file  Other Topics Concern   Not on file  Social History Narrative   Married, 3 daughters. 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 Drinks 3 caffeinated beverages/day.    Social Determinants of Health   Financial Resource Strain:  Not on file  Food Insecurity: Not on file  Transportation Needs: Not on file  Physical Activity: Not on file  Stress: Not on file  Social Connections: Not on file  Intimate Partner Violence: Not on file    Review of Systems: All negative except as stated above in HPI.  Physical Exam: Vital signs: Vitals:   03/20/22 1005  BP: 109/60  Pulse: 71  Resp: 16  Temp: 97.8 F (36.6 C)  SpO2: 96%     General:   Alert,  Well-developed, well-nourished, pleasant and cooperative in NAD Lungs: No visible respiratory distress Heart:  Regular rate and rhythm Abdomen: Soft, nontender, nondistended, bowel sounds present, no peritoneal signs Rectal:  Deferred  GI:  Lab Results: No results for input(s): "WBC", "HGB", "HCT", "PLT" in the last 72 hours. BMET No results for input(s): "NA", "K", "CL", "CO2", "GLUCOSE", "BUN", "CREATININE", "CALCIUM" in the last 72 hours. LFT No results for input(s): "PROT", "ALBUMIN", "AST", "ALT", "ALKPHOS", "BILITOT", "BILIDIR", "IBILI" in the last 72 hours. PT/INR No results for input(s): "LABPROT", "INR" in the last 72 hours.   Studies/Results: No results found.  Impression/Plan: -Personal history of adenomatous polyps -History of  CHF  Recommendations -------------------------- -Proceed with colonoscopy.  Risks (bleeding, infection, bowel perforation that could require surgery, sedation-related changes in cardiopulmonary systems), benefits (identification and possible treatment of source of symptoms, exclusion of certain causes of symptoms), and alternatives (watchful waiting, radiographic imaging studies, empiric medical treatment)  were explained to patient/family in detail and patient wishes to proceed.     LOS: 0 days   Otis Brace  MD, FACP 03/20/2022, 10:11 AM  Contact #  [phone]

## 2022-03-20 NOTE — Op Note (Signed)
[facility] Patient Name: Patrick Weaver Procedure Date: 03/20/2022 MRN: [id number] Attending MD: Otis Brace , MD, [id number] Date of Birth: 02/20/1945 CSN: [id number] Age: 77 Admit Type: Outpatient Procedure:                Colonoscopy Indications:              Surveillance: Personal history of adenomatous                            polyps on last colonoscopy > 5 years ago, Last                            colonoscopy: 2013 Providers:                Otis Brace, MD, Jaci Carrel, RN, Gloris Ham, Technician Referring MD:             Otis Brace, MD Medicines:                Sedation Administered by an Anesthesia Professional Complications:            No immediate complications. Estimated Blood Loss:     Estimated blood loss was minimal. Procedure:                Pre-Anesthesia Assessment:                           - Prior to the procedure, a History and Physical                            was performed, and patient medications and                            allergies were reviewed. The patient's tolerance of                            previous anesthesia was also reviewed. The risks                            and benefits of the procedure and the sedation                            options and risks were discussed with the patient.                            All questions were answered, and informed consent                            was obtained. Prior Anticoagulants: The patient has                            taken no anticoagulant or antiplatelet agents. ASA  Grade Assessment: III - A patient with severe                            systemic disease. After reviewing the risks and                            benefits, the patient was deemed in satisfactory                            condition to undergo the procedure.                           After obtaining informed consent, the colonoscope                             was passed under direct vision. Throughout the                            procedure, the patient's blood pressure, pulse, and                            oxygen saturations were monitored continuously. The                            [id number]) Olympus colonoscope was                            introduced through the anus and advanced to the the                            cecum, identified by appendiceal orifice and                            ileocecal valve. The colonoscopy was performed                            without difficulty. The patient tolerated the                            procedure well. The quality of the bowel                            preparation was adequate to identify polyps greater                            than 5 mm in size. The ileocecal valve, appendiceal                            orifice, and rectum were photographed. Scope In: 10:23:18 AM Scope Out: 10:42:50 AM Scope Withdrawal Time: 0 hours 16 minutes 20 seconds  Total Procedure Duration: 0 hours 19 minutes 32 seconds  Findings:      Hemorrhoids were found on perianal exam.      A 10 mm polyp was found in the cecum. The  polyp was semi-sessile. The       polyp was removed with a piecemeal technique using a cold snare.       Resection and retrieval were complete.      A 5 mm polyp was found in the ascending colon. The polyp was sessile.       The polyp was removed with a cold snare. Resection and retrieval were       complete.      A 7 mm polyp was found in the transverse colon. The polyp was sessile.       The polyp was removed with a cold snare. Resection and retrieval were       complete.      Three sessile polyps were found in the sigmoid colon. The polyps were 4       to 8 mm in size. These polyps were removed with a cold snare. Resection       and retrieval were complete.      A few diverticula were found in the sigmoid colon.      Internal hemorrhoids were found  during retroflexion. The hemorrhoids       were medium-sized. Impression:               - Hemorrhoids found on perianal exam.                           - One 10 mm polyp in the cecum, removed piecemeal                            using a cold snare. Resected and retrieved.                           - One 5 mm polyp in the ascending colon, removed                            with a cold snare. Resected and retrieved.                           - One 7 mm polyp in the transverse colon, removed                            with a cold snare. Resected and retrieved.                           - Three 4 to 8 mm polyps in the sigmoid colon,                            removed with a cold snare. Resected and retrieved.                           - Diverticulosis in the sigmoid colon.                           - Internal hemorrhoids. Moderate Sedation:      Moderate (conscious) sedation was personally administered by an       anesthesia professional. The following parameters were monitored: oxygen  saturation, heart rate, blood pressure, and response to care. Recommendation:           - Patient has a contact number available for                            emergencies. The signs and symptoms of potential                            delayed complications were discussed with the                            patient. Return to normal activities tomorrow.                            Written discharge instructions were provided to the                            patient.                           - Resume previous diet.                           - Continue present medications.                           - Await pathology results.                           - Repeat colonoscopy date to be determined after                            pending pathology results are reviewed for                            surveillance based on pathology results.                           - No ibuprofen, naproxen, or other  non-steroidal                            anti-inflammatory drugs for 2 weeks after polyp                            removal. Procedure Code(s):        --- Professional ---                           [phone], Colonoscopy, flexible; with removal of                            tumor(s), polyp(s), or other lesion(s) by snare                            technique Diagnosis Code(s):        --- Professional ---  Z86.010, Personal history of colonic polyps                           D12.0, Benign neoplasm of cecum                           D12.2, Benign neoplasm of ascending colon                           D12.3, Benign neoplasm of transverse colon (hepatic                            flexure or splenic flexure)                           D12.5, Benign neoplasm of sigmoid colon                           K64.8, Other hemorrhoids                           K57.30, Diverticulosis of large intestine without                            perforation or abscess without bleeding CPT copyright 2022 American Medical Association. All rights reserved. The codes documented in this report are preliminary and upon coder review may  be revised to meet current compliance requirements. Otis Brace, MD Otis Brace, MD 03/20/2022 10:49:45 AM Number of Addenda: 0

## 2022-03-20 NOTE — Anesthesia Postprocedure Evaluation (Signed)
Anesthesia Post Note  Patient: Patrick Weaver  Procedure(s) Performed: COLONOSCOPY WITH PROPOFOL POLYPECTOMY     Patient location during evaluation: Endoscopy Anesthesia Type: MAC Level of consciousness: awake and alert Pain management: pain level controlled Vital Signs Assessment: post-procedure vital signs reviewed and stable Respiratory status: spontaneous breathing, nonlabored ventilation, respiratory function stable and patient connected to nasal cannula oxygen Cardiovascular status: blood pressure returned to baseline and stable Postop Assessment: no apparent nausea or vomiting Anesthetic complications: no  No notable events documented.  Last Vitals:  Vitals:   03/20/22 1100 03/20/22 1110  BP: (!) 102/57 (!) 100/48  Pulse: 68 69  Resp: 16 18  Temp:    SpO2: 95% 93%    Last Pain:  Vitals:   03/20/22 1110  TempSrc:   PainSc: 0-No pain                 Amarian Botero L Gust [place]

## 2022-03-21 LAB — SURGICAL PATHOLOGY

## 2022-03-28 ENCOUNTER — Other Ambulatory Visit: Payer: Self-pay | Admitting: Internal Medicine

## 2022-04-01 ENCOUNTER — Other Ambulatory Visit: Payer: Self-pay | Admitting: Internal Medicine

## 2022-04-02 DIAGNOSIS — I429 Cardiomyopathy, unspecified: Secondary | ICD-10-CM | POA: Diagnosis not present

## 2022-04-02 DIAGNOSIS — Z008 Encounter for other general examination: Secondary | ICD-10-CM | POA: Diagnosis not present

## 2022-04-02 DIAGNOSIS — Z8249 Family history of ischemic heart disease and other diseases of the circulatory system: Secondary | ICD-10-CM | POA: Diagnosis not present

## 2022-04-02 DIAGNOSIS — E785 Hyperlipidemia, unspecified: Secondary | ICD-10-CM | POA: Diagnosis not present

## 2022-04-02 DIAGNOSIS — D6869 Other thrombophilia: Secondary | ICD-10-CM | POA: Diagnosis not present

## 2022-04-02 DIAGNOSIS — E876 Hypokalemia: Secondary | ICD-10-CM | POA: Diagnosis not present

## 2022-04-02 DIAGNOSIS — M1990 Unspecified osteoarthritis, unspecified site: Secondary | ICD-10-CM | POA: Diagnosis not present

## 2022-04-02 DIAGNOSIS — N400 Enlarged prostate without lower urinary tract symptoms: Secondary | ICD-10-CM | POA: Diagnosis not present

## 2022-04-02 DIAGNOSIS — J309 Allergic rhinitis, unspecified: Secondary | ICD-10-CM | POA: Diagnosis not present

## 2022-04-02 DIAGNOSIS — E1136 Type 2 diabetes mellitus with diabetic cataract: Secondary | ICD-10-CM | POA: Diagnosis not present

## 2022-04-02 DIAGNOSIS — I4891 Unspecified atrial fibrillation: Secondary | ICD-10-CM | POA: Diagnosis not present

## 2022-04-02 DIAGNOSIS — E1140 Type 2 diabetes mellitus with diabetic neuropathy, unspecified: Secondary | ICD-10-CM | POA: Diagnosis not present

## 2022-04-02 DIAGNOSIS — I110 Hypertensive heart disease with heart failure: Secondary | ICD-10-CM | POA: Diagnosis not present

## 2022-04-23 DIAGNOSIS — G4733 Obstructive sleep apnea (adult) (pediatric): Secondary | ICD-10-CM | POA: Diagnosis not present

## 2022-04-23 DIAGNOSIS — G4731 Primary central sleep apnea: Secondary | ICD-10-CM | POA: Diagnosis not present

## 2022-05-08 ENCOUNTER — Ambulatory Visit (INDEPENDENT_AMBULATORY_CARE_PROVIDER_SITE_OTHER): Payer: Medicare HMO

## 2022-05-08 DIAGNOSIS — I495 Sick sinus syndrome: Secondary | ICD-10-CM

## 2022-05-11 LAB — CUP PACEART REMOTE DEVICE CHECK
Battery Remaining Longevity: 135 mo
Battery Voltage: 3.01 V
Brady Statistic AP VP Percent: 8.27 %
Brady Statistic AP VS Percent: 89.97 %
Brady Statistic AS VP Percent: 0.06 %
Brady Statistic AS VS Percent: 1.71 %
Brady Statistic RA Percent Paced: 98.5 %
Brady Statistic RV Percent Paced: 8.32 %
Date Time Interrogation Session: 20240509195959
Implantable Lead Connection Status: 753985
Implantable Lead Connection Status: 753985
Implantable Lead Implant Date: 20090316
Implantable Lead Implant Date: 20090316
Implantable Lead Location: 753859
Implantable Lead Location: 753860
Implantable Lead Model: 5076
Implantable Lead Model: 5076
Implantable Pulse Generator Implant Date: 20221104
Lead Channel Impedance Value: 323 Ohm
Lead Channel Impedance Value: 380 Ohm
Lead Channel Impedance Value: 418 Ohm
Lead Channel Impedance Value: 475 Ohm
Lead Channel Pacing Threshold Amplitude: 0.625 V
Lead Channel Pacing Threshold Amplitude: 0.75 V
Lead Channel Pacing Threshold Pulse Width: 0.4 ms
Lead Channel Pacing Threshold Pulse Width: 0.4 ms
Lead Channel Sensing Intrinsic Amplitude: 1.5 mV
Lead Channel Sensing Intrinsic Amplitude: 1.5 mV
Lead Channel Sensing Intrinsic Amplitude: 7 mV
Lead Channel Sensing Intrinsic Amplitude: 7 mV
Lead Channel Setting Pacing Amplitude: 1.5 V
Lead Channel Setting Pacing Amplitude: 2 V
Lead Channel Setting Pacing Pulse Width: 0.4 ms
Lead Channel Setting Sensing Sensitivity: 4 mV
Zone Setting Status: 755011
Zone Setting Status: 755011

## 2022-05-23 DIAGNOSIS — N1831 Chronic kidney disease, stage 3a: Secondary | ICD-10-CM | POA: Diagnosis not present

## 2022-05-23 DIAGNOSIS — G4731 Primary central sleep apnea: Secondary | ICD-10-CM | POA: Diagnosis not present

## 2022-05-23 DIAGNOSIS — G4733 Obstructive sleep apnea (adult) (pediatric): Secondary | ICD-10-CM | POA: Diagnosis not present

## 2022-05-24 LAB — LAB REPORT - SCANNED: EGFR: 51

## 2022-05-30 NOTE — Progress Notes (Signed)
Remote pacemaker transmission.   

## 2022-06-23 DIAGNOSIS — G4731 Primary central sleep apnea: Secondary | ICD-10-CM | POA: Diagnosis not present

## 2022-06-23 DIAGNOSIS — G4733 Obstructive sleep apnea (adult) (pediatric): Secondary | ICD-10-CM | POA: Diagnosis not present

## 2022-06-26 ENCOUNTER — Encounter: Payer: Self-pay | Admitting: Internal Medicine

## 2022-06-27 DIAGNOSIS — M25551 Pain in right hip: Secondary | ICD-10-CM | POA: Diagnosis not present

## 2022-06-27 DIAGNOSIS — M25552 Pain in left hip: Secondary | ICD-10-CM | POA: Diagnosis not present

## 2022-06-27 DIAGNOSIS — M25521 Pain in right elbow: Secondary | ICD-10-CM | POA: Diagnosis not present

## 2022-06-27 MED ORDER — EMPAGLIFLOZIN 10 MG PO TABS: 10.0000 mg | ORAL_TABLET | Freq: Every day | ORAL | 2 refills | Status: DC

## 2022-07-10 ENCOUNTER — Encounter: Payer: Self-pay | Admitting: Internal Medicine

## 2022-07-23 DIAGNOSIS — G4733 Obstructive sleep apnea (adult) (pediatric): Secondary | ICD-10-CM | POA: Diagnosis not present

## 2022-07-24 ENCOUNTER — Other Ambulatory Visit: Payer: Self-pay | Admitting: Internal Medicine

## 2022-07-31 DIAGNOSIS — I495 Sick sinus syndrome: Secondary | ICD-10-CM | POA: Diagnosis not present

## 2022-07-31 DIAGNOSIS — I10 Essential (primary) hypertension: Secondary | ICD-10-CM | POA: Diagnosis not present

## 2022-07-31 DIAGNOSIS — R0902 Hypoxemia: Secondary | ICD-10-CM | POA: Diagnosis not present

## 2022-07-31 DIAGNOSIS — I700 Atherosclerosis of aorta: Secondary | ICD-10-CM | POA: Diagnosis not present

## 2022-07-31 DIAGNOSIS — Z950 Presence of cardiac pacemaker: Secondary | ICD-10-CM | POA: Diagnosis not present

## 2022-07-31 DIAGNOSIS — B0229 Other postherpetic nervous system involvement: Secondary | ICD-10-CM | POA: Diagnosis not present

## 2022-07-31 DIAGNOSIS — I7121 Aneurysm of the ascending aorta, without rupture: Secondary | ICD-10-CM | POA: Diagnosis not present

## 2022-07-31 DIAGNOSIS — N1831 Chronic kidney disease, stage 3a: Secondary | ICD-10-CM | POA: Diagnosis not present

## 2022-07-31 DIAGNOSIS — E785 Hyperlipidemia, unspecified: Secondary | ICD-10-CM | POA: Diagnosis not present

## 2022-08-01 DIAGNOSIS — R972 Elevated prostate specific antigen [PSA]: Secondary | ICD-10-CM | POA: Diagnosis not present

## 2022-08-07 ENCOUNTER — Ambulatory Visit (INDEPENDENT_AMBULATORY_CARE_PROVIDER_SITE_OTHER): Payer: Medicare HMO

## 2022-08-07 DIAGNOSIS — I495 Sick sinus syndrome: Secondary | ICD-10-CM

## 2022-08-08 DIAGNOSIS — R3912 Poor urinary stream: Secondary | ICD-10-CM | POA: Diagnosis not present

## 2022-08-08 DIAGNOSIS — R972 Elevated prostate specific antigen [PSA]: Secondary | ICD-10-CM | POA: Diagnosis not present

## 2022-08-08 DIAGNOSIS — N401 Enlarged prostate with lower urinary tract symptoms: Secondary | ICD-10-CM | POA: Diagnosis not present

## 2022-08-23 DIAGNOSIS — G4733 Obstructive sleep apnea (adult) (pediatric): Secondary | ICD-10-CM | POA: Diagnosis not present

## 2022-08-23 NOTE — Progress Notes (Signed)
Remote pacemaker transmission.   

## 2022-08-29 ENCOUNTER — Other Ambulatory Visit: Payer: Self-pay | Admitting: Internal Medicine

## 2022-09-09 ENCOUNTER — Other Ambulatory Visit: Payer: Self-pay | Admitting: Internal Medicine

## 2022-09-23 DIAGNOSIS — G4733 Obstructive sleep apnea (adult) (pediatric): Secondary | ICD-10-CM | POA: Diagnosis not present

## 2022-10-16 ENCOUNTER — Other Ambulatory Visit (HOSPITAL_COMMUNITY): Payer: Self-pay

## 2022-10-16 ENCOUNTER — Telehealth: Payer: Self-pay

## 2022-10-16 NOTE — Telephone Encounter (Signed)
Please send an rx for Jardiance to [id number] - [place], [place] - [address]

## 2022-10-16 NOTE — Telephone Encounter (Signed)
Patient Advocate Encounter Was helping pt's wife with patient assistance and she requested it for husband as well.   The patient was approved for a [employer] grant that will help cover the cost of JARDIANCE AND ENTRESTO Total amount awarded, $10,000.  Effective: 09/16/22 - 09/15/23   BIN [id number] PCN [id number] Group [id number] ID [id number]   Pharmacy provided with approval and processing information. Patient informed via Dorcas Carrow, CPhT  Pharmacy Patient Advocate Specialist  Direct Number: [phone] Fax: [phone]

## 2022-10-17 MED ORDER — EMPAGLIFLOZIN 10 MG PO TABS: 10.0000 mg | ORAL_TABLET | Freq: Every day | ORAL | 0 refills | Status: DC

## 2022-10-17 NOTE — Addendum Note (Signed)
Addended by: Margaret Pyle D on: 10/17/2022 10:11 AM   Modules accepted: Orders

## 2022-10-17 NOTE — Telephone Encounter (Signed)
Pt's medication was sent to pt's pharmacy as requested. Confirmation received.  °

## 2022-10-21 ENCOUNTER — Other Ambulatory Visit: Payer: Self-pay | Admitting: Internal Medicine

## 2022-11-05 DIAGNOSIS — H52221 Regular astigmatism, right eye: Secondary | ICD-10-CM | POA: Diagnosis not present

## 2022-11-05 DIAGNOSIS — H26492 Other secondary cataract, left eye: Secondary | ICD-10-CM | POA: Diagnosis not present

## 2022-11-05 DIAGNOSIS — Z961 Presence of intraocular lens: Secondary | ICD-10-CM | POA: Diagnosis not present

## 2022-11-05 DIAGNOSIS — H43813 Vitreous degeneration, bilateral: Secondary | ICD-10-CM | POA: Diagnosis not present

## 2022-11-05 DIAGNOSIS — H2511 Age-related nuclear cataract, right eye: Secondary | ICD-10-CM | POA: Diagnosis not present

## 2022-11-05 DIAGNOSIS — H5211 Myopia, right eye: Secondary | ICD-10-CM | POA: Diagnosis not present

## 2022-11-05 DIAGNOSIS — H524 Presbyopia: Secondary | ICD-10-CM | POA: Diagnosis not present

## 2022-11-06 ENCOUNTER — Telehealth: Payer: Self-pay | Admitting: Internal Medicine

## 2022-11-06 ENCOUNTER — Ambulatory Visit (INDEPENDENT_AMBULATORY_CARE_PROVIDER_SITE_OTHER): Payer: Medicare HMO

## 2022-11-06 DIAGNOSIS — I495 Sick sinus syndrome: Secondary | ICD-10-CM | POA: Diagnosis not present

## 2022-11-06 LAB — CUP PACEART REMOTE DEVICE CHECK
Battery Remaining Longevity: 130 mo
Battery Voltage: 3 V
Brady Statistic AP VP Percent: 12.02 %
Brady Statistic AP VS Percent: 87.07 %
Brady Statistic AS VP Percent: 0.03 %
Brady Statistic AS VS Percent: 0.88 %
Brady Statistic RA Percent Paced: 99.24 %
Brady Statistic RV Percent Paced: 12.05 %
Date Time Interrogation Session: 20241104215842
Implantable Lead Connection Status: 753985
Implantable Lead Connection Status: 753985
Implantable Lead Implant Date: 20090316
Implantable Lead Implant Date: 20090316
Implantable Lead Location: 753859
Implantable Lead Location: 753860
Implantable Lead Model: 5076
Implantable Lead Model: 5076
Implantable Pulse Generator Implant Date: 20221104
Lead Channel Impedance Value: 323 Ohm
Lead Channel Impedance Value: 380 Ohm
Lead Channel Impedance Value: 418 Ohm
Lead Channel Impedance Value: 494 Ohm
Lead Channel Pacing Threshold Amplitude: 0.625 V
Lead Channel Pacing Threshold Amplitude: 0.75 V
Lead Channel Pacing Threshold Pulse Width: 0.4 ms
Lead Channel Pacing Threshold Pulse Width: 0.4 ms
Lead Channel Sensing Intrinsic Amplitude: 0.625 mV
Lead Channel Sensing Intrinsic Amplitude: 0.625 mV
Lead Channel Sensing Intrinsic Amplitude: 7.625 mV
Lead Channel Sensing Intrinsic Amplitude: 7.625 mV
Lead Channel Setting Pacing Amplitude: 1.5 V
Lead Channel Setting Pacing Amplitude: 2 V
Lead Channel Setting Pacing Pulse Width: 0.4 ms
Lead Channel Setting Sensing Sensitivity: 4 mV
Zone Setting Status: 755011
Zone Setting Status: 755011

## 2022-11-06 NOTE — Telephone Encounter (Signed)
Pt's [employer] patient assistance application was scanned to [employer], [facility] email. FYI

## 2022-11-06 NOTE — Telephone Encounter (Signed)
Please send in prescription for Entresto to [id number] - [place], [place] - [address]  including grant billing information in RX comment ([id number], [id number], Group: [id number], [id number] )

## 2022-11-06 NOTE — Telephone Encounter (Signed)
Paper Work Dropped Off: [employer] patient Finanacial Assistance   Date: 11/06/2022  Location of paper:  [employer] in [employer]  in front office, copy in [employer]

## 2022-11-22 DIAGNOSIS — N1831 Chronic kidney disease, stage 3a: Secondary | ICD-10-CM | POA: Diagnosis not present

## 2022-11-22 DIAGNOSIS — I7121 Aneurysm of the ascending aorta, without rupture: Secondary | ICD-10-CM | POA: Diagnosis not present

## 2022-11-22 DIAGNOSIS — Z950 Presence of cardiac pacemaker: Secondary | ICD-10-CM | POA: Diagnosis not present

## 2022-11-22 DIAGNOSIS — I493 Ventricular premature depolarization: Secondary | ICD-10-CM | POA: Diagnosis not present

## 2022-11-22 DIAGNOSIS — I129 Hypertensive chronic kidney disease with stage 1 through stage 4 chronic kidney disease, or unspecified chronic kidney disease: Secondary | ICD-10-CM | POA: Diagnosis not present

## 2022-11-22 DIAGNOSIS — I429 Cardiomyopathy, unspecified: Secondary | ICD-10-CM | POA: Diagnosis not present

## 2022-11-23 LAB — LAB REPORT - SCANNED
Creatinine, POC: 147.5 mg/dL
EGFR: 54

## 2022-12-04 ENCOUNTER — Telehealth: Payer: Self-pay | Admitting: Internal Medicine

## 2022-12-04 NOTE — Telephone Encounter (Signed)
Wife is calling in about patient assistant paperwork. Calling to see if it was fax over. Please advise

## 2022-12-04 NOTE — Progress Notes (Signed)
Remote pacemaker transmission.   

## 2022-12-05 ENCOUNTER — Telehealth: Payer: Self-pay | Admitting: Internal Medicine

## 2022-12-05 ENCOUNTER — Telehealth: Payer: Self-pay

## 2022-12-05 NOTE — Telephone Encounter (Signed)
Attempted to call pt to go over [facility] note from today 12/4 stating that [place] were covered by the [employer]. LMTCB.

## 2022-12-05 NOTE — Telephone Encounter (Signed)
Attempted to call pt to go over [facility]

## 2022-12-05 NOTE — Telephone Encounter (Signed)
Spoke with pt and clarified that d/t him having the [employer], he does not need to file for patient assistance per Haze Rushing and has a $0 copay for both Jardiance and Entresto through [employer]. Pt verbalized understanding and stated he would call back with any other questions or concerns.

## 2022-12-05 NOTE — Telephone Encounter (Signed)
Pt called about pt assistance. I told her what Haze Rushing said in phone note on 12/05/22 that it is not needed because they were approved for [employer]. Wife said that was for only one medication. I told her it was for [facility]. She wants someone to call her back to confirm that. Please advise

## 2022-12-05 NOTE — Telephone Encounter (Signed)
Please see 10/16/22 encounter. Patient does not need to complete patient assistance forms for Entresto or Jardiance. Patient was approved for a healthwell grant and has access to the medications at a $0 copay cost. Patient is aware of grant approval.

## 2022-12-09 ENCOUNTER — Encounter: Payer: Self-pay | Admitting: Internal Medicine

## 2022-12-10 MED ORDER — EMPAGLIFLOZIN 10 MG PO TABS: 10.0000 mg | ORAL_TABLET | Freq: Every day | ORAL | 0 refills | Status: DC

## 2022-12-11 ENCOUNTER — Encounter: Payer: Self-pay | Admitting: Internal Medicine

## 2022-12-11 MED ORDER — ENTRESTO 24-26 MG PO TABS: ORAL_TABLET | ORAL | 3 refills | Status: DC

## 2022-12-14 DIAGNOSIS — R69 Illness, unspecified: Secondary | ICD-10-CM | POA: Diagnosis not present

## 2022-12-22 ENCOUNTER — Other Ambulatory Visit: Payer: Self-pay | Admitting: Internal Medicine

## 2023-01-04 ENCOUNTER — Encounter: Payer: Self-pay | Admitting: Internal Medicine

## 2023-01-04 ENCOUNTER — Ambulatory Visit: Payer: Medicare HMO | Attending: Internal Medicine | Admitting: Internal Medicine

## 2023-01-04 VITALS — BP 122/84 | HR 80 | Ht 72.0 in | Wt 216.4 lb

## 2023-01-04 DIAGNOSIS — Z79899 Other long term (current) drug therapy: Secondary | ICD-10-CM

## 2023-01-04 DIAGNOSIS — I440 Atrioventricular block, first degree: Secondary | ICD-10-CM

## 2023-01-04 DIAGNOSIS — I77810 Thoracic aortic ectasia: Secondary | ICD-10-CM | POA: Diagnosis not present

## 2023-01-04 DIAGNOSIS — I428 Other cardiomyopathies: Secondary | ICD-10-CM

## 2023-01-04 DIAGNOSIS — I493 Ventricular premature depolarization: Secondary | ICD-10-CM | POA: Diagnosis not present

## 2023-01-04 DIAGNOSIS — I495 Sick sinus syndrome: Secondary | ICD-10-CM

## 2023-01-04 DIAGNOSIS — I5022 Chronic systolic (congestive) heart failure: Secondary | ICD-10-CM | POA: Diagnosis not present

## 2023-01-04 DIAGNOSIS — E039 Hypothyroidism, unspecified: Secondary | ICD-10-CM

## 2023-01-04 DIAGNOSIS — Z950 Presence of cardiac pacemaker: Secondary | ICD-10-CM

## 2023-01-04 LAB — HEPATIC FUNCTION PANEL
ALT: 27 [IU]/L (ref 0–44)
AST: 22 [IU]/L (ref 0–40)
Albumin: 4.7 g/dL (ref 3.8–4.8)
Alkaline Phosphatase: 97 [IU]/L (ref 44–121)
Bilirubin Total: 0.6 mg/dL (ref 0.0–1.2)
Bilirubin, Direct: 0.2 mg/dL (ref 0.00–0.40)
Total Protein: 6.7 g/dL (ref 6.0–8.5)

## 2023-01-04 LAB — CUP PACEART INCLINIC DEVICE CHECK
Battery Remaining Longevity: 128 mo
Battery Voltage: 3 V
Brady Statistic AP VP Percent: 9.91 %
Brady Statistic AP VS Percent: 88.65 %
Brady Statistic AS VP Percent: 0.06 %
Brady Statistic AS VS Percent: 1.37 %
Brady Statistic RA Percent Paced: 98.8 %
Brady Statistic RV Percent Paced: 9.97 %
Date Time Interrogation Session: 20250103095422
Implantable Lead Connection Status: 753985
Implantable Lead Connection Status: 753985
Implantable Lead Implant Date: 20090316
Implantable Lead Implant Date: 20090316
Implantable Lead Location: 753859
Implantable Lead Location: 753860
Implantable Lead Model: 5076
Implantable Lead Model: 5076
Implantable Pulse Generator Implant Date: 20221104
Lead Channel Impedance Value: 304 Ohm
Lead Channel Impedance Value: 380 Ohm
Lead Channel Impedance Value: 418 Ohm
Lead Channel Impedance Value: 494 Ohm
Lead Channel Pacing Threshold Amplitude: 0.5 V
Lead Channel Pacing Threshold Amplitude: 0.875 V
Lead Channel Pacing Threshold Pulse Width: 0.4 ms
Lead Channel Pacing Threshold Pulse Width: 0.4 ms
Lead Channel Sensing Intrinsic Amplitude: 1.25 mV
Lead Channel Sensing Intrinsic Amplitude: 1.375 mV
Lead Channel Sensing Intrinsic Amplitude: 7.375 mV
Lead Channel Sensing Intrinsic Amplitude: 7.625 mV
Lead Channel Setting Pacing Amplitude: 1.5 V
Lead Channel Setting Pacing Amplitude: 2 V
Lead Channel Setting Pacing Pulse Width: 0.4 ms
Lead Channel Setting Sensing Sensitivity: 4 mV
Zone Setting Status: 755011
Zone Setting Status: 755011

## 2023-01-04 LAB — TSH: TSH: 3.38 u[IU]/mL (ref 0.450–4.500)

## 2023-01-04 MED ORDER — AMIODARONE HCL 200 MG PO TABS: 100.0000 mg | ORAL_TABLET | Freq: Every day | ORAL | 1 refills | Status: AC

## 2023-01-04 NOTE — Progress Notes (Signed)
 Patient ID: Patrick Weaver, male   DOB: [birth year], 78 y.o.   MRN: [id number] o     Patient Care Team: Dayna Motto, DO as PCP - General (Family Medicine) Okey Vina GAILS, MD as PCP - Cardiology (Cardiology) Fernande Elspeth BROCKS, MD as PCP - Electrophysiology (Cardiology)   HPI  Patrick Weaver is a 78 y.o. male seen in followup for pacer (Medtronic) implantation for bradycardia. Gen change 11/22. Significant problems with PVCs treated initially with flecainide  with much improvement. As noted below, interval decrease in left ventricular function  prompted the discontinuation of flecainide  and initiation of amiodarone . PVCs were obliterated but without interval improvement in LVEF. Hence he underwent catheterization.  8/22, discussions regarding upgrade of his pacemaker to high-voltage given his modest depression LV function.  It was elected, then in the context of Entresto  therapy,  his borderline ejection fraction and age in the context of the [place] trial that we will hold off on upgrade.    Pancreatic mass identified which has had to undergo endoscopic surveillance rather than MRI 2/2 Device   Minimal shortness of breath.  No chest pain.  No peripheral edema.  No lightheadedness with standing early a.m. blood pressures are on the low side DATE TEST EF%   8/15 Echo  65 %   7/17  Echo   25 % LAE (52/2.5/35)  12/17 Echo  25% AI  mild   2/18 Cath  No obstructive CAD  8/18 Echo  20-25%    12/18 CT Aorta  4.5 cm Aneruysm  9/19 Echo  25-30% LAE severe (57/2.6/28)  5/21 CT Aorta  Aneurysm 43 mm  10/21 Echo  35% AoRoot 46 mm  8/22 Echo  30-35% LAE severe  10/23 Echo  30-35% AoRoot 47 mm     Date Cr K Hgb TSH LFTs PFTs BNP  10/17     3.19 29     5/18  1.37   2.47 18 DLCO stable   9/19 1.28   2.72 17    10/21 1.26 (4/21)  14.2(12/21) 4.29 13    2/22 1.25 4.3       10/22 1.55 4.5   2.93 (8/22) 20 (8/22)  884  11/22 1.62  14.7      10/23 1.59 4.0 15.2 4.49 28               Past Medical  History:  Diagnosis Date   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   CT 12/18: Ascending aorta 4.5 cm, aortic root 4.6 cm , approximately 4.3 cm  (05/04/19)          Bladder stone    BPH (benign prostatic hyperplasia)    Bradycardia    a.s/p MDT dual chamber pacemaker   Chronic systolic heart failure (HCC) 08/13/2015   Echo 10/18: Moderate LVH, EF 25, mild AI, aortic annulus dilated at 53 mm, sinotubular junction is 34 mm, proximal ascending aorta is 39 mm // Echo 12/17: Mild LVH, EF 25-30, diffuse HK, mild AI, mild MR, severe LAE, mild RAE, PASP 42 // Echo 7/17: EF 25-30, severe diffuse HK (disproportionately severe HK of the inferolateral and inferior myocardium), mild to moderate AI, aortic root 44 mm, ascend   Dyslipidemia    failed niaspan   History of echocardiogram    Echo 09/26/2017: EF 25-30, normal wall motion, grade 2 diastolic dysfunction, mild AI, severe LAE, mildly reduced RVSF   HTN (hypertension)    NICM (nonischemic cardiomyopathy) (HCC)    LHC2/18: no  angiographic CAD   PVCs (premature ventricular contractions)    controlled on Amiodarone  // Holter 11/17: PVCs 1.8%   Sleep apnea    USES C-PAP    Past Surgical History:  Procedure Laterality Date   COLONOSCOPY WITH PROPOFOL  N/A 03/20/2022   Procedure: COLONOSCOPY WITH PROPOFOL ;  Surgeon: Elicia Claw, MD;  Location: [facility];  Service: Gastroenterology;  Laterality: N/A;   CYSTOSCOPY WITH LITHOLAPAXY N/A 07/12/2014   Procedure: CYSTOSCOPY WITH LITHOLAPAXY;  Surgeon: Gretel Ferrara, MD;  Location: [facility];  Service: Urology;  Laterality: N/A;   EP IMPLANTABLE DEVICE  2009   LEFT HEART CATH AND CORONARY ANGIOGRAPHY N/A 02/15/2016   Procedure: Left Heart Cath and Coronary Angiography;  Surgeon: Lonni JONETTA Cash, MD;  Location: [facility] INVASIVE CV LAB;  Service: Cardiovascular;  Laterality: N/A;   POLYPECTOMY  03/20/2022   Procedure: POLYPECTOMY;  Surgeon: Elicia Claw, MD;  Location: [facility];  Service:  Gastroenterology;;   OSCAR GENERATOR CHANGEOUT N/A 11/04/2020   Procedure: PPM GENERATOR CHANGEOUT;  Surgeon: Fernande Elspeth BROCKS, MD;  Location: [facility] INVASIVE CV LAB;  Service: Cardiovascular;  Laterality: N/A;   TONSILLECTOMY     TRANSURETHRAL RESECTION OF PROSTATE N/A 07/12/2014   Procedure: TRANSURETHRAL RESECTION OF THE PROSTATE (TURP);  Surgeon: Gretel Ferrara, MD;  Location: [facility];  Service: Urology;  Laterality: N/A;    Current Outpatient Medications  Medication Sig Dispense Refill   acetaminophen  (TYLENOL ) 325 MG tablet Take 2 tablets (650 mg total) by mouth every 6 (six) hours as needed for mild pain (or Fever >/= 101). 30 tablet 0   amiodarone  (PACERONE ) 200 MG tablet TAKE ONE TABLET BY MOUTH ONCE DAILY EXCEPT SUNDAYS. (Patient taking differently: Take 200 mg by mouth. Monday - Friday) 72 tablet 2   atorvastatin  (LIPITOR) 10 MG tablet Take 1 tablet (10 mg total) by mouth daily. 90 tablet 0   carvedilol  (COREG ) 6.25 MG tablet TAKE 1 TABLET BY MOUTH TWICE A DAY 180 tablet 2   empagliflozin  (JARDIANCE ) 10 MG TABS tablet Take 1 tablet (10 mg total) by mouth daily before breakfast. 90 tablet 0   finasteride (PROSCAR) 5 MG tablet Take 5 mg by mouth at bedtime.  11   fluticasone (FLONASE) 50 MCG/ACT nasal spray Place 1 spray into both nostrils daily as needed for rhinitis.     furosemide  (LASIX ) 40 MG tablet TAKE 1 TABLET (40 MG TOTAL) BY MOUTH 2 (TWO) TIMES A WEEK. 26 tablet 0   gabapentin  (NEURONTIN ) 300 MG capsule Take 300 mg by mouth at bedtime.     potassium chloride  (KLOR-CON ) 10 MEQ tablet TAKE 1 TABLET BY MOUTH EVERY DAY 90 tablet 0   sacubitril -valsartan  (ENTRESTO ) 24-26 MG TAKE 1 TABLET BY MOUTH TWICE A DAY. 180 tablet 3   spironolactone  (ALDACTONE ) 25 MG tablet TAKE 1/2 TABLET BY MOUTH DAILY 45 tablet 0   tamsulosin  (FLOMAX ) 0.4 MG CAPS capsule Take 0.4 mg by mouth daily as needed (helps with  urination).     No current facility-administered medications for this visit.    Allergies   Allergen Reactions   Codeine Nausea Only    Review of Systems negative except from HPI and PMH  Physical Exam: BP 122/84   Pulse 80   Ht 6' (1.829 m)   Wt 216 lb 6.4 oz (98.2 kg)   SpO2 94%   BMI 29.35 kg/m   Well developed and well nourished in no acute distress HENT normal Neck supple with JVP-flat Clear Device pocket well healed; without hematoma or  erythema.  There is no tethering  Regular rate and rhythm, no   gallop No murmur Abd-soft with active BS No Clubbing cyanosis  edema Skin-warm and dry A & Oriented  Grossly normal sensory and motor function  ECG atrial pacing at 80 Intervals 36/13/42  Device function is normal. Programming changes none   See Paceart for details     Asssessment and  Plan  Sinus node dysfunction  First-degree AV block-profound  Pacemaker-Medtronic    PVCsinfrequent with intrinsicoid deflection of 80 ms   Amiodarone     HTN  Renal insufficiency grade 3  NICM  Dyspnea with a decreased DLCO  Congestive heart failure-chronic-systolic-class 2B-3a  Aortic root dilatation with central AI  Pancreatic mass  PVC burden continues to be low.  It is hard to know exactly because some of them are late coupled but the measured number on his device has decreased, and without symptoms, we will decrease the amiodarone  to 7 days a week at 100 mg.  Check amiodarone  surveillance laboratories.  With his aortic root dilatation, we will repeat his echocardiogram  Patient was warned about not using Cipro  and similar antibiotics. Recent studies have raised concern that fluoroquinolone antibiotics could be associated with an increased risk of aortic aneurysm Fluoroquinolones have non-antimicrobial properties that might jeopardise the integrity of the extracellular matrix of the vascular wall In a  propensity score matched cohort study in [place], there was a 66% increased rate of aortic aneurysm or dissection associated with oral fluoroquinolone  use, compared with amoxicillin  use, within a 60 day risk period from start of treatment  First-degree AV block is likely related to amiodarone .  Least aggravated thereby.  Perhaps will improve with decreasing the dose.  Dyspnea is surprisingly minimal.  Continue to follow clinically.  Euvolemic.  Continue him on his Jardiance  and spironolactone .  A.m. blood pressures are low per report, but without symptoms.  Continue Aldactone  Entresto  and carvedilol  for his cardiomyopathy.

## 2023-01-04 NOTE — Patient Instructions (Signed)
 Medication Instructions:  Your physician has recommended you make the following change in your medication:   ** Decrease Amiodarone  200mg  to 1/2 tablet by mouth daily.  *If you need a refill on your cardiac medications before your next appointment, please call your pharmacy*   Lab Work: TSH and Hepatic Profile If you have labs (blood work) drawn today and your tests are completely normal, you will receive your results only by: MyChart Message (if you have MyChart) OR A paper copy in the mail If you have any lab test that is abnormal or we need to change your treatment, we will call you to review the results.   Testing/Procedures: Your physician has requested that you have an echocardiogram. Echocardiography is a painless test that uses sound waves to create images of your heart. It provides your doctor with information about the size and shape of your heart and how well your heart's chambers and valves are working. This procedure takes approximately one hour. There are no restrictions for this procedure. Please do NOT wear cologne, perfume, aftershave, or lotions (deodorant is allowed). Please arrive 15 minutes prior to your appointment time.  Please note: We ask at that you not bring children with you during ultrasound (echo/ vascular) testing. Due to room size and safety concerns, children are not allowed in the ultrasound rooms during exams. Our front office staff cannot provide observation of children in our lobby area while testing is being conducted. An adult accompanying a patient to their appointment will only be allowed in the ultrasound room at the discretion of the ultrasound technician under special circumstances. We apologize for any inconvenience.    Follow-Up: At [facility], you and your health needs are our priority.  As part of our continuing mission to provide you with exceptional heart care, we have created designated Provider Care Teams.  These Care Teams  include your primary Cardiologist (physician) and Advanced Practice Providers (APPs -  Physician Assistants and Nurse Practitioners) who all work together to provide you with the care you need, when you need it.  We recommend signing up for the patient portal called MyChart.  Sign up information is provided on this After Visit Summary.  MyChart is used to connect with patients for Virtual Visits (Telemedicine).  Patients are able to view lab/test results, encounter notes, upcoming appointments, etc.  Non-urgent messages can be sent to your provider as well.   To learn more about what you can do with MyChart, go to [email or web address].    Your next appointment:   6 months with EP APP

## 2023-01-08 DIAGNOSIS — I7121 Aneurysm of the ascending aorta, without rupture: Secondary | ICD-10-CM | POA: Diagnosis not present

## 2023-01-08 DIAGNOSIS — I495 Sick sinus syndrome: Secondary | ICD-10-CM | POA: Diagnosis not present

## 2023-01-08 DIAGNOSIS — Z1331 Encounter for screening for depression: Secondary | ICD-10-CM | POA: Diagnosis not present

## 2023-01-08 DIAGNOSIS — I10 Essential (primary) hypertension: Secondary | ICD-10-CM | POA: Diagnosis not present

## 2023-01-08 DIAGNOSIS — Z0000 Encounter for general adult medical examination without abnormal findings: Secondary | ICD-10-CM | POA: Diagnosis not present

## 2023-01-08 DIAGNOSIS — Z23 Encounter for immunization: Secondary | ICD-10-CM | POA: Diagnosis not present

## 2023-01-08 DIAGNOSIS — N1831 Chronic kidney disease, stage 3a: Secondary | ICD-10-CM | POA: Diagnosis not present

## 2023-01-08 DIAGNOSIS — E785 Hyperlipidemia, unspecified: Secondary | ICD-10-CM | POA: Diagnosis not present

## 2023-01-08 DIAGNOSIS — K862 Cyst of pancreas: Secondary | ICD-10-CM | POA: Diagnosis not present

## 2023-01-08 DIAGNOSIS — I5022 Chronic systolic (congestive) heart failure: Secondary | ICD-10-CM | POA: Diagnosis not present

## 2023-01-08 DIAGNOSIS — Z6829 Body mass index (BMI) 29.0-29.9, adult: Secondary | ICD-10-CM | POA: Diagnosis not present

## 2023-01-08 DIAGNOSIS — Z950 Presence of cardiac pacemaker: Secondary | ICD-10-CM | POA: Diagnosis not present

## 2023-01-16 ENCOUNTER — Other Ambulatory Visit: Payer: Self-pay | Admitting: Internal Medicine

## 2023-01-29 ENCOUNTER — Other Ambulatory Visit: Payer: Self-pay | Admitting: Internal Medicine

## 2023-01-29 ENCOUNTER — Ambulatory Visit (HOSPITAL_COMMUNITY): Payer: Medicare HMO | Attending: Cardiology

## 2023-01-29 DIAGNOSIS — I77810 Thoracic aortic ectasia: Secondary | ICD-10-CM | POA: Diagnosis not present

## 2023-01-29 DIAGNOSIS — I428 Other cardiomyopathies: Secondary | ICD-10-CM | POA: Diagnosis not present

## 2023-01-29 DIAGNOSIS — I5022 Chronic systolic (congestive) heart failure: Secondary | ICD-10-CM | POA: Diagnosis not present

## 2023-01-29 LAB — ECHOCARDIOGRAM COMPLETE
Area-P 1/2: 6.87 cm2
P 1/2 time: 947 ms
S' Lateral: 4.5 cm

## 2023-02-05 ENCOUNTER — Ambulatory Visit (INDEPENDENT_AMBULATORY_CARE_PROVIDER_SITE_OTHER): Payer: Medicare HMO

## 2023-02-05 DIAGNOSIS — I440 Atrioventricular block, first degree: Secondary | ICD-10-CM

## 2023-02-05 DIAGNOSIS — I495 Sick sinus syndrome: Secondary | ICD-10-CM

## 2023-02-06 LAB — CUP PACEART REMOTE DEVICE CHECK
Battery Remaining Longevity: 127 mo
Battery Voltage: 3.01 V
Brady Statistic AP VP Percent: 7.3 %
Brady Statistic AP VS Percent: 91.89 %
Brady Statistic AS VP Percent: 0.02 %
Brady Statistic AS VS Percent: 0.79 %
Brady Statistic RA Percent Paced: 99.23 %
Brady Statistic RV Percent Paced: 7.32 %
Date Time Interrogation Session: 20250203225332
Implantable Lead Connection Status: 753985
Implantable Lead Connection Status: 753985
Implantable Lead Implant Date: 20090316
Implantable Lead Implant Date: 20090316
Implantable Lead Location: 753859
Implantable Lead Location: 753860
Implantable Lead Model: 5076
Implantable Lead Model: 5076
Implantable Pulse Generator Implant Date: 20221104
Lead Channel Impedance Value: 323 Ohm
Lead Channel Impedance Value: 380 Ohm
Lead Channel Impedance Value: 399 Ohm
Lead Channel Impedance Value: 475 Ohm
Lead Channel Pacing Threshold Amplitude: 0.625 V
Lead Channel Pacing Threshold Amplitude: 0.875 V
Lead Channel Pacing Threshold Pulse Width: 0.4 ms
Lead Channel Pacing Threshold Pulse Width: 0.4 ms
Lead Channel Sensing Intrinsic Amplitude: 1.125 mV
Lead Channel Sensing Intrinsic Amplitude: 1.125 mV
Lead Channel Sensing Intrinsic Amplitude: 9 mV
Lead Channel Sensing Intrinsic Amplitude: 9 mV
Lead Channel Setting Pacing Amplitude: 1.5 V
Lead Channel Setting Pacing Amplitude: 2 V
Lead Channel Setting Pacing Pulse Width: 0.4 ms
Lead Channel Setting Sensing Sensitivity: 4 mV
Zone Setting Status: 755011
Zone Setting Status: 755011

## 2023-02-07 DIAGNOSIS — J069 Acute upper respiratory infection, unspecified: Secondary | ICD-10-CM | POA: Diagnosis not present

## 2023-02-08 ENCOUNTER — Other Ambulatory Visit: Payer: Self-pay | Admitting: Internal Medicine

## 2023-02-12 ENCOUNTER — Other Ambulatory Visit (HOSPITAL_COMMUNITY): Payer: Self-pay

## 2023-02-15 ENCOUNTER — Other Ambulatory Visit: Payer: Self-pay | Admitting: Internal Medicine

## 2023-02-22 ENCOUNTER — Other Ambulatory Visit: Payer: Self-pay | Admitting: Internal Medicine

## 2023-02-22 NOTE — Telephone Encounter (Signed)
Pt is requesting refill on medication, but has not been seen since 2021. Please advise Thank you

## 2023-03-04 ENCOUNTER — Encounter: Payer: Self-pay | Admitting: Internal Medicine

## 2023-03-12 ENCOUNTER — Other Ambulatory Visit: Payer: Self-pay | Admitting: Gastroenterology

## 2023-03-12 DIAGNOSIS — K862 Cyst of pancreas: Secondary | ICD-10-CM

## 2023-03-12 DIAGNOSIS — K7689 Other specified diseases of liver: Secondary | ICD-10-CM

## 2023-03-14 NOTE — Addendum Note (Signed)
 Addended by: Geralyn Flash D on: 03/14/2023 12:39 PM   Modules accepted: Orders

## 2023-03-14 NOTE — Progress Notes (Signed)
 Remote pacemaker transmission.

## 2023-03-28 ENCOUNTER — Other Ambulatory Visit: Payer: Self-pay | Admitting: Internal Medicine

## 2023-04-03 DIAGNOSIS — G4733 Obstructive sleep apnea (adult) (pediatric): Secondary | ICD-10-CM | POA: Diagnosis not present

## 2023-04-03 DIAGNOSIS — G4731 Primary central sleep apnea: Secondary | ICD-10-CM | POA: Diagnosis not present

## 2023-04-16 ENCOUNTER — Ambulatory Visit (HOSPITAL_COMMUNITY)
Admission: RE | Admit: 2023-04-16 | Discharge: 2023-04-16 | Disposition: A | Discharge status: Home / Self Care | Source: Ambulatory Visit | Attending: Gastroenterology | Admitting: Gastroenterology

## 2023-04-16 ENCOUNTER — Other Ambulatory Visit: Payer: Self-pay | Admitting: Gastroenterology

## 2023-04-16 DIAGNOSIS — K7689 Other specified diseases of liver: Secondary | ICD-10-CM

## 2023-04-16 DIAGNOSIS — K862 Cyst of pancreas: Secondary | ICD-10-CM | POA: Diagnosis not present

## 2023-04-16 DIAGNOSIS — R932 Abnormal findings on diagnostic imaging of liver and biliary tract: Secondary | ICD-10-CM | POA: Diagnosis not present

## 2023-04-16 DIAGNOSIS — R162 Hepatomegaly with splenomegaly, not elsewhere classified: Secondary | ICD-10-CM | POA: Diagnosis not present

## 2023-04-16 DIAGNOSIS — N281 Cyst of kidney, acquired: Secondary | ICD-10-CM | POA: Diagnosis not present

## 2023-04-16 DIAGNOSIS — K8689 Other specified diseases of pancreas: Secondary | ICD-10-CM | POA: Diagnosis not present

## 2023-04-16 MED ORDER — GADOBUTROL 1 MMOL/ML IV SOLN
9.0000 mL | Freq: Once | INTRAVENOUS | Status: AC | PRN
  Administered 2023-04-16: 9 mL via INTRAVENOUS

## 2023-04-16 NOTE — Progress Notes (Signed)
  Device system confirmed to be MRI conditional, with implant date > 6 weeks ago, and no evidence of abandoned or epicardial leads in review of most recent CXR  Device last cleared by EP Provider:    Katherine Pancake 04/16/2023   Clearance is good through for 1 year as long as parameters remain stable at time of check. If pt undergoes a cardiac device procedure during that time, they should be re-cleared.   Tachy-therapies to be programmed off if applicable with device back to pre-MRI settings after completion of exam.  Medtronic - Programming recommendation received through Medtronic App/Tablet  Jerline Moon  04/16/2023 8:59 AM

## 2023-04-27 ENCOUNTER — Other Ambulatory Visit: Payer: Self-pay | Admitting: Internal Medicine

## 2023-05-07 ENCOUNTER — Ambulatory Visit (INDEPENDENT_AMBULATORY_CARE_PROVIDER_SITE_OTHER): Payer: Medicare HMO

## 2023-05-07 DIAGNOSIS — I440 Atrioventricular block, first degree: Secondary | ICD-10-CM | POA: Diagnosis not present

## 2023-05-07 DIAGNOSIS — K862 Cyst of pancreas: Secondary | ICD-10-CM | POA: Diagnosis not present

## 2023-05-07 LAB — CUP PACEART REMOTE DEVICE CHECK
Battery Remaining Longevity: 125 mo
Battery Voltage: 3 V
Brady Statistic AP VP Percent: 8.45 %
Brady Statistic AP VS Percent: 90.88 %
Brady Statistic AS VP Percent: 0.04 %
Brady Statistic AS VS Percent: 0.63 %
Brady Statistic RA Percent Paced: 99.43 %
Brady Statistic RV Percent Paced: 8.49 %
Date Time Interrogation Session: 20250506065657
Implantable Lead Connection Status: 753985
Implantable Lead Connection Status: 753985
Implantable Lead Implant Date: 20090316
Implantable Lead Implant Date: 20090316
Implantable Lead Location: 753859
Implantable Lead Location: 753860
Implantable Lead Model: 5076
Implantable Lead Model: 5076
Implantable Pulse Generator Implant Date: 20221104
Lead Channel Impedance Value: 342 Ohm
Lead Channel Impedance Value: 399 Ohm
Lead Channel Impedance Value: 399 Ohm
Lead Channel Impedance Value: 456 Ohm
Lead Channel Pacing Threshold Amplitude: 0.625 V
Lead Channel Pacing Threshold Amplitude: 0.75 V
Lead Channel Pacing Threshold Pulse Width: 0.4 ms
Lead Channel Pacing Threshold Pulse Width: 0.4 ms
Lead Channel Sensing Intrinsic Amplitude: 0.625 mV
Lead Channel Sensing Intrinsic Amplitude: 0.625 mV
Lead Channel Sensing Intrinsic Amplitude: 7.375 mV
Lead Channel Sensing Intrinsic Amplitude: 7.375 mV
Lead Channel Setting Pacing Amplitude: 1.5 V
Lead Channel Setting Pacing Amplitude: 2 V
Lead Channel Setting Pacing Pulse Width: 0.4 ms
Lead Channel Setting Sensing Sensitivity: 4 mV
Zone Setting Status: 755011
Zone Setting Status: 755011

## 2023-05-22 ENCOUNTER — Other Ambulatory Visit: Payer: Self-pay | Admitting: Internal Medicine

## 2023-05-29 ENCOUNTER — Ambulatory Visit: Payer: Self-pay | Admitting: Cardiology

## 2023-05-29 DIAGNOSIS — I129 Hypertensive chronic kidney disease with stage 1 through stage 4 chronic kidney disease, or unspecified chronic kidney disease: Secondary | ICD-10-CM | POA: Diagnosis not present

## 2023-05-29 DIAGNOSIS — I7121 Aneurysm of the ascending aorta, without rupture: Secondary | ICD-10-CM | POA: Diagnosis not present

## 2023-05-29 DIAGNOSIS — I493 Ventricular premature depolarization: Secondary | ICD-10-CM | POA: Diagnosis not present

## 2023-05-29 DIAGNOSIS — N1831 Chronic kidney disease, stage 3a: Secondary | ICD-10-CM | POA: Diagnosis not present

## 2023-05-29 DIAGNOSIS — Z950 Presence of cardiac pacemaker: Secondary | ICD-10-CM | POA: Diagnosis not present

## 2023-05-29 DIAGNOSIS — I429 Cardiomyopathy, unspecified: Secondary | ICD-10-CM | POA: Diagnosis not present

## 2023-05-30 LAB — LAB REPORT - SCANNED
Creatinine, POC: 116.3 mg/dL
EGFR: 48

## 2023-06-05 ENCOUNTER — Encounter: Payer: Self-pay | Admitting: Nephrology

## 2023-06-19 NOTE — Progress Notes (Signed)
 Remote pacemaker transmission.

## 2023-08-06 ENCOUNTER — Ambulatory Visit (INDEPENDENT_AMBULATORY_CARE_PROVIDER_SITE_OTHER): Payer: Medicare HMO

## 2023-08-06 DIAGNOSIS — I495 Sick sinus syndrome: Secondary | ICD-10-CM | POA: Diagnosis not present

## 2023-08-07 LAB — CUP PACEART REMOTE DEVICE CHECK
Battery Remaining Longevity: 121 mo
Battery Voltage: 3 V
Brady Statistic AP VP Percent: 7.86 %
Brady Statistic AP VS Percent: 91.44 %
Brady Statistic AS VP Percent: 0.04 %
Brady Statistic AS VS Percent: 0.67 %
Brady Statistic RA Percent Paced: 99.42 %
Brady Statistic RV Percent Paced: 7.89 %
Date Time Interrogation Session: 20250805064844
Implantable Lead Connection Status: 753985
Implantable Lead Connection Status: 753985
Implantable Lead Implant Date: 20090316
Implantable Lead Implant Date: 20090316
Implantable Lead Location: 753859
Implantable Lead Location: 753860
Implantable Lead Model: 5076
Implantable Lead Model: 5076
Implantable Pulse Generator Implant Date: 20221104
Lead Channel Impedance Value: 323 Ohm
Lead Channel Impedance Value: 380 Ohm
Lead Channel Impedance Value: 456 Ohm
Lead Channel Impedance Value: 513 Ohm
Lead Channel Pacing Threshold Amplitude: 0.625 V
Lead Channel Pacing Threshold Amplitude: 0.875 V
Lead Channel Pacing Threshold Pulse Width: 0.4 ms
Lead Channel Pacing Threshold Pulse Width: 0.4 ms
Lead Channel Sensing Intrinsic Amplitude: 1.625 mV
Lead Channel Sensing Intrinsic Amplitude: 1.625 mV
Lead Channel Sensing Intrinsic Amplitude: 7 mV
Lead Channel Sensing Intrinsic Amplitude: 7 mV
Lead Channel Setting Pacing Amplitude: 1.5 V
Lead Channel Setting Pacing Amplitude: 2 V
Lead Channel Setting Pacing Pulse Width: 0.4 ms
Lead Channel Setting Sensing Sensitivity: 4 mV
Zone Setting Status: 755011
Zone Setting Status: 755011

## 2023-08-09 DIAGNOSIS — R972 Elevated prostate specific antigen [PSA]: Secondary | ICD-10-CM | POA: Diagnosis not present

## 2023-08-13 DIAGNOSIS — G4733 Obstructive sleep apnea (adult) (pediatric): Secondary | ICD-10-CM | POA: Diagnosis not present

## 2023-08-13 DIAGNOSIS — G4731 Primary central sleep apnea: Secondary | ICD-10-CM | POA: Diagnosis not present

## 2023-09-11 ENCOUNTER — Encounter: Payer: Self-pay | Admitting: Internal Medicine

## 2023-09-16 MED ORDER — COVID-19 MRNA VAC-TRIS(PFIZER) 30 MCG/0.3ML IM SUSY: 0.3000 mL | PREFILLED_SYRINGE | Freq: Once | INTRAMUSCULAR | 0 refills | Status: AC

## 2023-09-30 ENCOUNTER — Encounter: Payer: Self-pay | Admitting: Internal Medicine

## 2023-09-30 NOTE — Progress Notes (Signed)
 Remote PPM Transmission

## 2023-11-05 ENCOUNTER — Ambulatory Visit: Payer: Medicare HMO

## 2023-11-05 DIAGNOSIS — I495 Sick sinus syndrome: Secondary | ICD-10-CM | POA: Diagnosis not present

## 2023-11-05 LAB — CUP PACEART REMOTE DEVICE CHECK
Battery Remaining Longevity: 118 mo
Battery Voltage: 3 V
Brady Statistic AP VP Percent: 8.43 %
Brady Statistic AP VS Percent: 90.83 %
Brady Statistic AS VP Percent: 0.05 %
Brady Statistic AS VS Percent: 0.69 %
Brady Statistic RA Percent Paced: 99.42 %
Brady Statistic RV Percent Paced: 8.48 %
Date Time Interrogation Session: 20251103215201
Implantable Lead Connection Status: 753985
Implantable Lead Connection Status: 753985
Implantable Lead Implant Date: 20090316
Implantable Lead Implant Date: 20090316
Implantable Lead Location: 753859
Implantable Lead Location: 753860
Implantable Lead Model: 5076
Implantable Lead Model: 5076
Implantable Pulse Generator Implant Date: 20221104
Lead Channel Impedance Value: 323 Ohm
Lead Channel Impedance Value: 380 Ohm
Lead Channel Impedance Value: 418 Ohm
Lead Channel Impedance Value: 475 Ohm
Lead Channel Pacing Threshold Amplitude: 0.5 V
Lead Channel Pacing Threshold Amplitude: 0.75 V
Lead Channel Pacing Threshold Pulse Width: 0.4 ms
Lead Channel Pacing Threshold Pulse Width: 0.4 ms
Lead Channel Sensing Intrinsic Amplitude: 0.875 mV
Lead Channel Sensing Intrinsic Amplitude: 0.875 mV
Lead Channel Sensing Intrinsic Amplitude: 9 mV
Lead Channel Sensing Intrinsic Amplitude: 9 mV
Lead Channel Setting Pacing Amplitude: 1.5 V
Lead Channel Setting Pacing Amplitude: 2 V
Lead Channel Setting Pacing Pulse Width: 0.4 ms
Lead Channel Setting Sensing Sensitivity: 4 mV
Zone Setting Status: 755011
Zone Setting Status: 755011

## 2023-11-06 ENCOUNTER — Ambulatory Visit: Payer: Self-pay | Admitting: Cardiology

## 2023-11-08 ENCOUNTER — Encounter: Payer: Self-pay | Admitting: Internal Medicine

## 2023-11-10 ENCOUNTER — Ambulatory Visit: Payer: Self-pay | Admitting: Student in an Organized Health Care Education/Training Program

## 2023-11-11 DIAGNOSIS — H43813 Vitreous degeneration, bilateral: Secondary | ICD-10-CM | POA: Diagnosis not present

## 2023-11-11 DIAGNOSIS — H52221 Regular astigmatism, right eye: Secondary | ICD-10-CM | POA: Diagnosis not present

## 2023-11-11 DIAGNOSIS — H2511 Age-related nuclear cataract, right eye: Secondary | ICD-10-CM | POA: Diagnosis not present

## 2023-11-11 DIAGNOSIS — H524 Presbyopia: Secondary | ICD-10-CM | POA: Diagnosis not present

## 2023-11-11 DIAGNOSIS — Z961 Presence of intraocular lens: Secondary | ICD-10-CM | POA: Diagnosis not present

## 2023-11-11 DIAGNOSIS — H5211 Myopia, right eye: Secondary | ICD-10-CM | POA: Diagnosis not present

## 2023-11-11 DIAGNOSIS — H26492 Other secondary cataract, left eye: Secondary | ICD-10-CM | POA: Diagnosis not present

## 2023-11-11 NOTE — Progress Notes (Signed)
 Remote PPM Transmission

## 2023-11-18 ENCOUNTER — Encounter: Payer: Self-pay | Admitting: Internal Medicine

## 2023-11-18 ENCOUNTER — Telehealth: Payer: Self-pay | Admitting: Pharmacy Technician

## 2023-11-18 MED ORDER — SACUBITRIL-VALSARTAN 24-26 MG PO TABS: ORAL_TABLET | ORAL | 0 refills | Status: AC

## 2023-11-18 NOTE — Telephone Encounter (Signed)
 Patient Advocate Encounter   The patient was approved for a [employer] grant that will help cover the cost of entresto  Total amount awarded, [phone].  Effective: 10/19/23 - 10/17/24   APW:[id number] ERW:[id number] Hmnle:[id number] PI:[id number] [employer] ID: [id number]   Pharmacy provided with approval and processing information. Patient informed via mychart

## 2023-11-26 DIAGNOSIS — G4733 Obstructive sleep apnea (adult) (pediatric): Secondary | ICD-10-CM | POA: Diagnosis not present

## 2023-11-26 DIAGNOSIS — G4731 Primary central sleep apnea: Secondary | ICD-10-CM | POA: Diagnosis not present

## 2024-01-28 ENCOUNTER — Encounter: Payer: Self-pay | Admitting: *Deleted

## 2024-01-28 NOTE — Progress Notes (Signed)
 Patrick Weaver                                          MRN: [id number]   01/28/2024   The [facility] Quality Team Specialist reviewed this patient medical record for the purposes of chart review for care gap closure. The following were reviewed: chart review for care gap closure-kidney health evaluation for diabetes:eGFR  and uACR.    [facility] Quality Team

## 2024-02-04 ENCOUNTER — Ambulatory Visit: Payer: Medicare HMO

## 2024-02-05 LAB — CUP PACEART REMOTE DEVICE CHECK
Battery Remaining Longevity: 116 mo
Battery Voltage: 2.99 V
Brady Statistic AP VP Percent: 5.56 %
Brady Statistic AP VS Percent: 93.92 %
Brady Statistic AS VP Percent: 0.02 %
Brady Statistic AS VS Percent: 0.5 %
Brady Statistic RA Percent Paced: 99.65 %
Brady Statistic RV Percent Paced: 5.58 %
Date Time Interrogation Session: 20260203050740
Implantable Lead Connection Status: 753985
Implantable Lead Connection Status: 753985
Implantable Lead Implant Date: 20090316
Implantable Lead Implant Date: 20090316
Implantable Lead Location: 753859
Implantable Lead Location: 753860
Implantable Lead Model: 5076
Implantable Lead Model: 5076
Implantable Pulse Generator Implant Date: 20221104
Lead Channel Impedance Value: 323 Ohm
Lead Channel Impedance Value: 399 Ohm
Lead Channel Impedance Value: 418 Ohm
Lead Channel Impedance Value: 494 Ohm
Lead Channel Pacing Threshold Amplitude: 0.5 V
Lead Channel Pacing Threshold Amplitude: 0.75 V
Lead Channel Pacing Threshold Pulse Width: 0.4 ms
Lead Channel Pacing Threshold Pulse Width: 0.4 ms
Lead Channel Sensing Intrinsic Amplitude: 1.5 mV
Lead Channel Sensing Intrinsic Amplitude: 1.5 mV
Lead Channel Sensing Intrinsic Amplitude: 8.25 mV
Lead Channel Sensing Intrinsic Amplitude: 8.25 mV
Lead Channel Setting Pacing Amplitude: 1.5 V
Lead Channel Setting Pacing Amplitude: 2 V
Lead Channel Setting Pacing Pulse Width: 0.4 ms
Lead Channel Setting Sensing Sensitivity: 4 mV
Zone Setting Status: 755011
Zone Setting Status: 755011

## 2024-05-05 ENCOUNTER — Ambulatory Visit

## 2024-08-04 ENCOUNTER — Ambulatory Visit

## 2024-11-03 ENCOUNTER — Ambulatory Visit

## 2025-02-02 ENCOUNTER — Ambulatory Visit

## 2025-05-04 ENCOUNTER — Ambulatory Visit

## 2025-08-03 ENCOUNTER — Ambulatory Visit

## 2025-11-02 ENCOUNTER — Ambulatory Visit

## 2026-02-01 ENCOUNTER — Ambulatory Visit
